# Patient Record
Sex: Female | Born: 1985
Health system: Southern US, Community
[De-identification: ages and names within clinical notes are randomized; demographics above are authoritative.]

## PROBLEM LIST (undated history)

## (undated) DIAGNOSIS — F32A Depression, unspecified: Secondary | ICD-10-CM

## (undated) DIAGNOSIS — F419 Anxiety disorder, unspecified: Secondary | ICD-10-CM

## (undated) DIAGNOSIS — N921 Excessive and frequent menstruation with irregular cycle: Secondary | ICD-10-CM

## (undated) DIAGNOSIS — F329 Major depressive disorder, single episode, unspecified: Secondary | ICD-10-CM

## (undated) DIAGNOSIS — N390 Urinary tract infection, site not specified: Secondary | ICD-10-CM

## (undated) HISTORY — DX: Excessive and frequent menstruation with irregular cycle: N92.1

## (undated) HISTORY — DX: Major depressive disorder, single episode, unspecified: F32.9

## (undated) HISTORY — DX: Urinary tract infection, site not specified: N39.0

## (undated) HISTORY — DX: Depression, unspecified: F32.A

## (undated) HISTORY — DX: Anxiety disorder, unspecified: F41.9

## (undated) HISTORY — PX: GALLBLADDER SURGERY: SHX652

---

## 2005-04-07 ENCOUNTER — Observation Stay: Payer: Self-pay

## 2005-05-21 ENCOUNTER — Observation Stay: Payer: Self-pay | Admitting: Obstetrics and Gynecology

## 2005-05-21 ENCOUNTER — Inpatient Hospital Stay: Payer: Self-pay | Admitting: Obstetrics and Gynecology

## 2006-11-26 ENCOUNTER — Ambulatory Visit: Payer: Self-pay | Admitting: Internal Medicine

## 2007-08-16 ENCOUNTER — Inpatient Hospital Stay: Payer: Self-pay

## 2009-06-12 ENCOUNTER — Ambulatory Visit: Payer: Self-pay | Admitting: Physician Assistant

## 2009-10-11 ENCOUNTER — Other Ambulatory Visit: Payer: Self-pay | Admitting: Physician Assistant

## 2009-10-12 ENCOUNTER — Ambulatory Visit: Payer: Self-pay | Admitting: General Practice

## 2011-03-26 ENCOUNTER — Other Ambulatory Visit: Payer: Self-pay | Admitting: Physician Assistant

## 2011-06-24 ENCOUNTER — Ambulatory Visit: Payer: Self-pay | Admitting: General Practice

## 2011-07-08 ENCOUNTER — Emergency Department: Payer: Self-pay | Admitting: General Practice

## 2011-08-19 ENCOUNTER — Other Ambulatory Visit: Payer: Self-pay | Admitting: Physician Assistant

## 2011-08-28 ENCOUNTER — Ambulatory Visit: Payer: Self-pay | Admitting: Family Medicine

## 2011-08-29 ENCOUNTER — Emergency Department: Payer: Self-pay | Admitting: Unknown Physician Specialty

## 2011-09-02 ENCOUNTER — Other Ambulatory Visit: Payer: Self-pay | Admitting: Family

## 2011-09-08 ENCOUNTER — Ambulatory Visit: Payer: Self-pay | Admitting: Gastroenterology

## 2011-09-19 ENCOUNTER — Other Ambulatory Visit: Payer: Self-pay | Admitting: Family

## 2011-09-29 ENCOUNTER — Ambulatory Visit: Payer: Self-pay | Admitting: Gastroenterology

## 2011-10-10 ENCOUNTER — Ambulatory Visit: Payer: Self-pay | Admitting: Gastroenterology

## 2011-10-17 LAB — PATHOLOGY REPORT

## 2011-10-24 ENCOUNTER — Ambulatory Visit: Payer: Self-pay | Admitting: General Surgery

## 2011-10-29 LAB — PATHOLOGY REPORT

## 2011-12-24 ENCOUNTER — Other Ambulatory Visit: Payer: Self-pay | Admitting: Family

## 2011-12-24 LAB — COMPREHENSIVE METABOLIC PANEL
Anion Gap: 13 (ref 7–16)
BUN: 11 mg/dL (ref 7–18)
Calcium, Total: 8.7 mg/dL (ref 8.5–10.1)
Chloride: 103 mmol/L (ref 98–107)
Co2: 26 mmol/L (ref 21–32)
Creatinine: 0.98 mg/dL (ref 0.60–1.30)
EGFR (African American): >60
EGFR (Non-African Amer.): >60
SGOT(AST): 17 U/L (ref 15–37)
SGPT (ALT): 18 U/L
Total Protein: 7.8 g/dL (ref 6.4–8.2)

## 2011-12-24 LAB — CBC WITH DIFFERENTIAL/PLATELET
Basophil #: 0 10*3/uL (ref 0.0–0.1)
Eosinophil #: 0.1 10*3/uL (ref 0.0–0.7)
HCT: 42.4 % (ref 35.0–47.0)
Lymphocyte %: 21.7 %
MCH: 30.4 pg (ref 26.0–34.0)
MCHC: 33.9 g/dL (ref 32.0–36.0)
MCV: 90 fL (ref 80–100)
Monocyte #: 0.6 10*3/uL (ref 0.0–0.7)
Neutrophil #: 7 10*3/uL — ABNORMAL HIGH (ref 1.4–6.5)
Neutrophil %: 71 %
Platelet: 267 10*3/uL (ref 150–440)
RDW: 12.4 % (ref 11.5–14.5)
WBC: 9.9 10*3/uL (ref 3.6–11.0)

## 2011-12-24 LAB — SEDIMENTATION RATE: Erythrocyte Sed Rate: 2 mm/hr (ref 0–20)

## 2011-12-31 ENCOUNTER — Ambulatory Visit: Payer: Self-pay | Admitting: Gastroenterology

## 2012-01-19 ENCOUNTER — Other Ambulatory Visit: Payer: Self-pay | Admitting: Gastroenterology

## 2012-01-19 LAB — URINALYSIS, COMPLETE
Bilirubin,UR: NEGATIVE
Leukocyte Esterase: NEGATIVE
Ph: 6 (ref 4.5–8.0)
Protein: NEGATIVE
RBC,UR: 1 /HPF (ref 0–5)
Squamous Epithelial: 2

## 2012-01-20 LAB — URINE CULTURE

## 2012-02-17 ENCOUNTER — Other Ambulatory Visit: Payer: Self-pay | Admitting: Family

## 2012-03-17 ENCOUNTER — Ambulatory Visit: Payer: Self-pay | Admitting: Gastroenterology

## 2012-03-17 LAB — CBC WITH DIFFERENTIAL/PLATELET
Basophil #: 0 10*3/uL (ref 0.0–0.1)
Basophil %: 0.3 %
HCT: 41.5 % (ref 35.0–47.0)
Lymphocyte %: 12.2 %
MCH: 30.5 pg (ref 26.0–34.0)
Monocyte #: 0.5 x10 3/mm (ref 0.2–0.9)
Monocyte %: 4.9 %
Neutrophil #: 7.5 10*3/uL — ABNORMAL HIGH (ref 1.4–6.5)
Neutrophil %: 80.5 %
RBC: 4.63 10*6/uL (ref 3.80–5.20)
RDW: 13.1 % (ref 11.5–14.5)

## 2012-03-17 LAB — URINALYSIS, COMPLETE
Blood: NEGATIVE
Glucose,UR: NEGATIVE mg/dL (ref 0–75)
Ketone: NEGATIVE
Protein: NEGATIVE
Specific Gravity: 1.014 (ref 1.003–1.030)

## 2012-03-17 LAB — AMYLASE: Amylase: 55 U/L (ref 25–115)

## 2012-03-17 LAB — COMPREHENSIVE METABOLIC PANEL
Albumin: 3.7 g/dL (ref 3.4–5.0)
Alkaline Phosphatase: 51 U/L (ref 50–136)
Anion Gap: 7 (ref 7–16)
Calcium, Total: 8.8 mg/dL (ref 8.5–10.1)
Co2: 27 mmol/L (ref 21–32)
Creatinine: 0.75 mg/dL (ref 0.60–1.30)
EGFR (African American): >60
Glucose: 80 mg/dL (ref 65–99)
Osmolality: 278 (ref 275–301)
SGOT(AST): 19 U/L (ref 15–37)
SGPT (ALT): 23 U/L
Sodium: 140 mmol/L (ref 136–145)
Total Protein: 7.3 g/dL (ref 6.4–8.2)

## 2012-03-17 LAB — LIPASE, BLOOD: Lipase: 80 U/L (ref 73–393)

## 2012-03-17 LAB — SEDIMENTATION RATE: Erythrocyte Sed Rate: 4 mm/hr (ref 0–20)

## 2012-03-31 ENCOUNTER — Ambulatory Visit: Payer: Self-pay | Admitting: Gastroenterology

## 2012-04-02 LAB — PATHOLOGY REPORT

## 2012-04-17 IMAGING — CR DG ABDOMEN 2V
1 series · 2 of 2 positions shown · non-contrast
Comparison: none

REASON FOR EXAM: abominal pain consipation and nausea
COMMENTS:

PROCEDURE:     DXR - DXR ABDOMEN 2 V FLAT AND ERECT  - September 29, 2011  [DATE]
RESULT:     Comparisons:  None

[Series 1: w abdomen upright · 0.14mm/px · 2 of 2 slices shown]
[im 1/2]
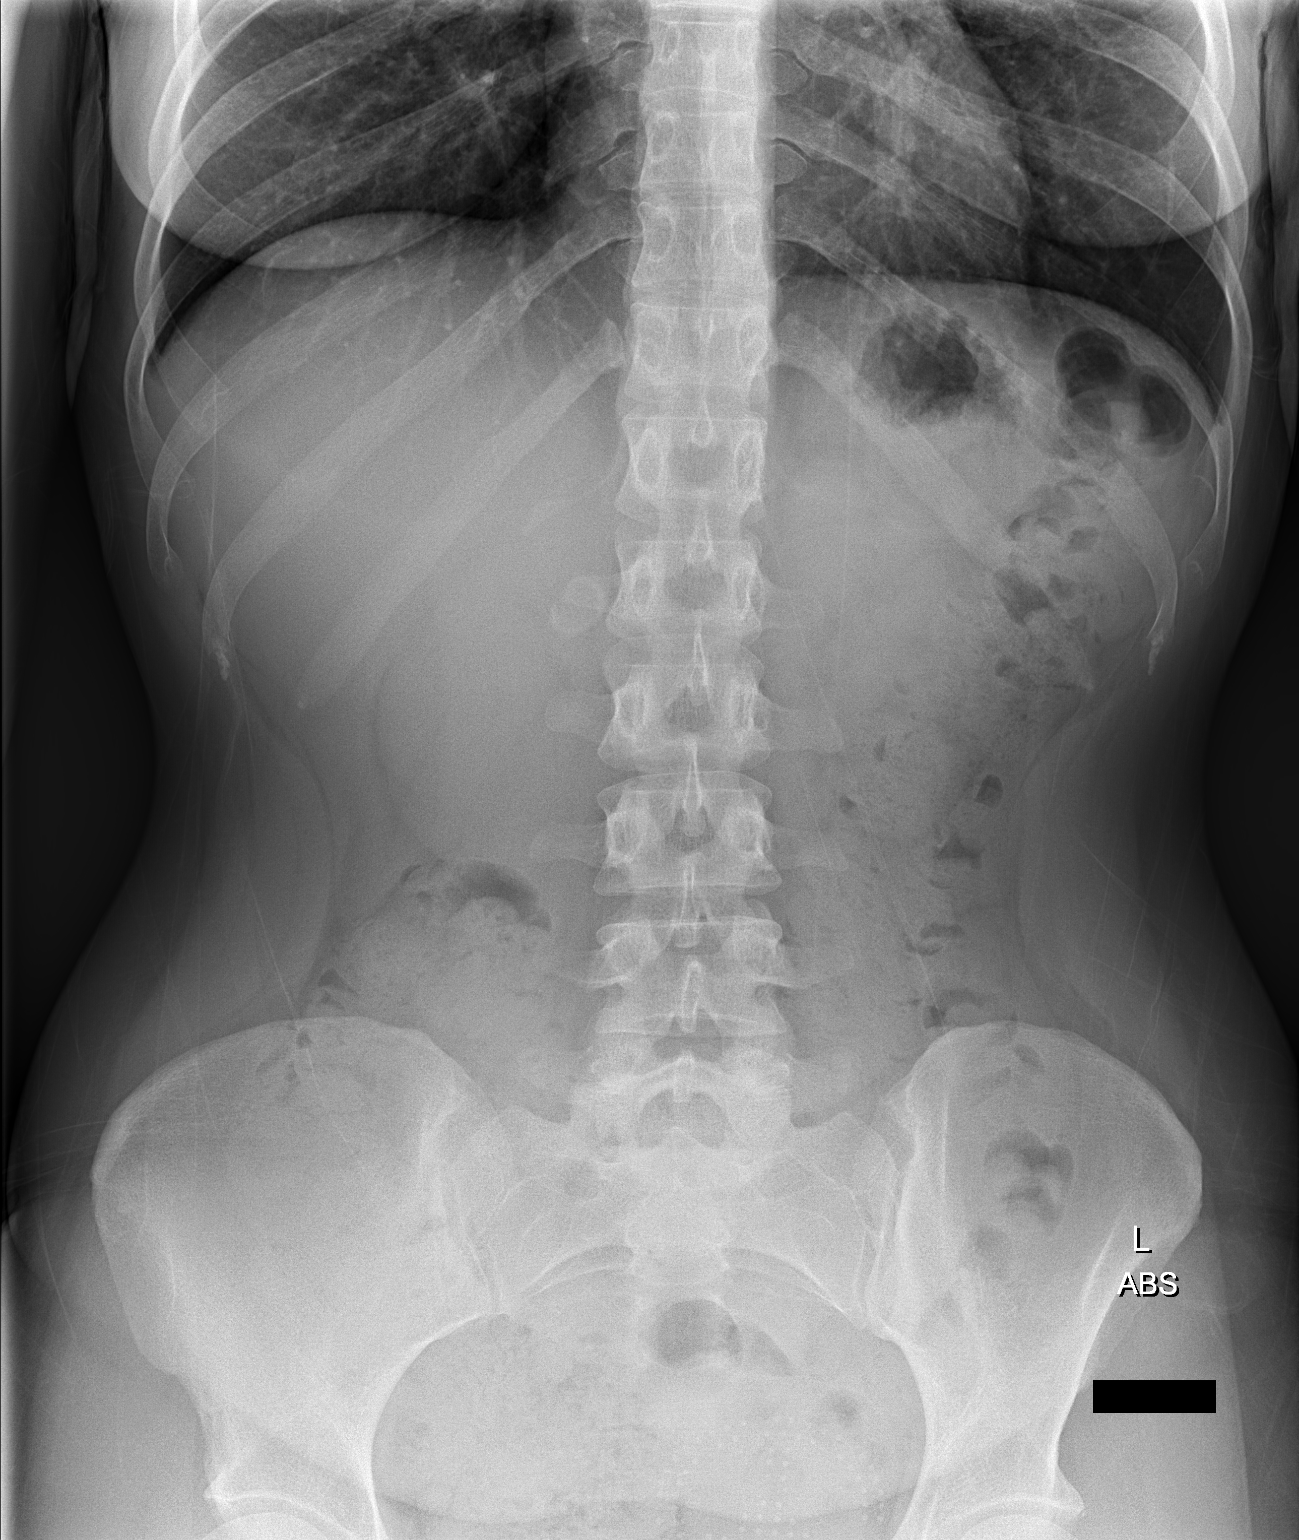
[im 2/2]
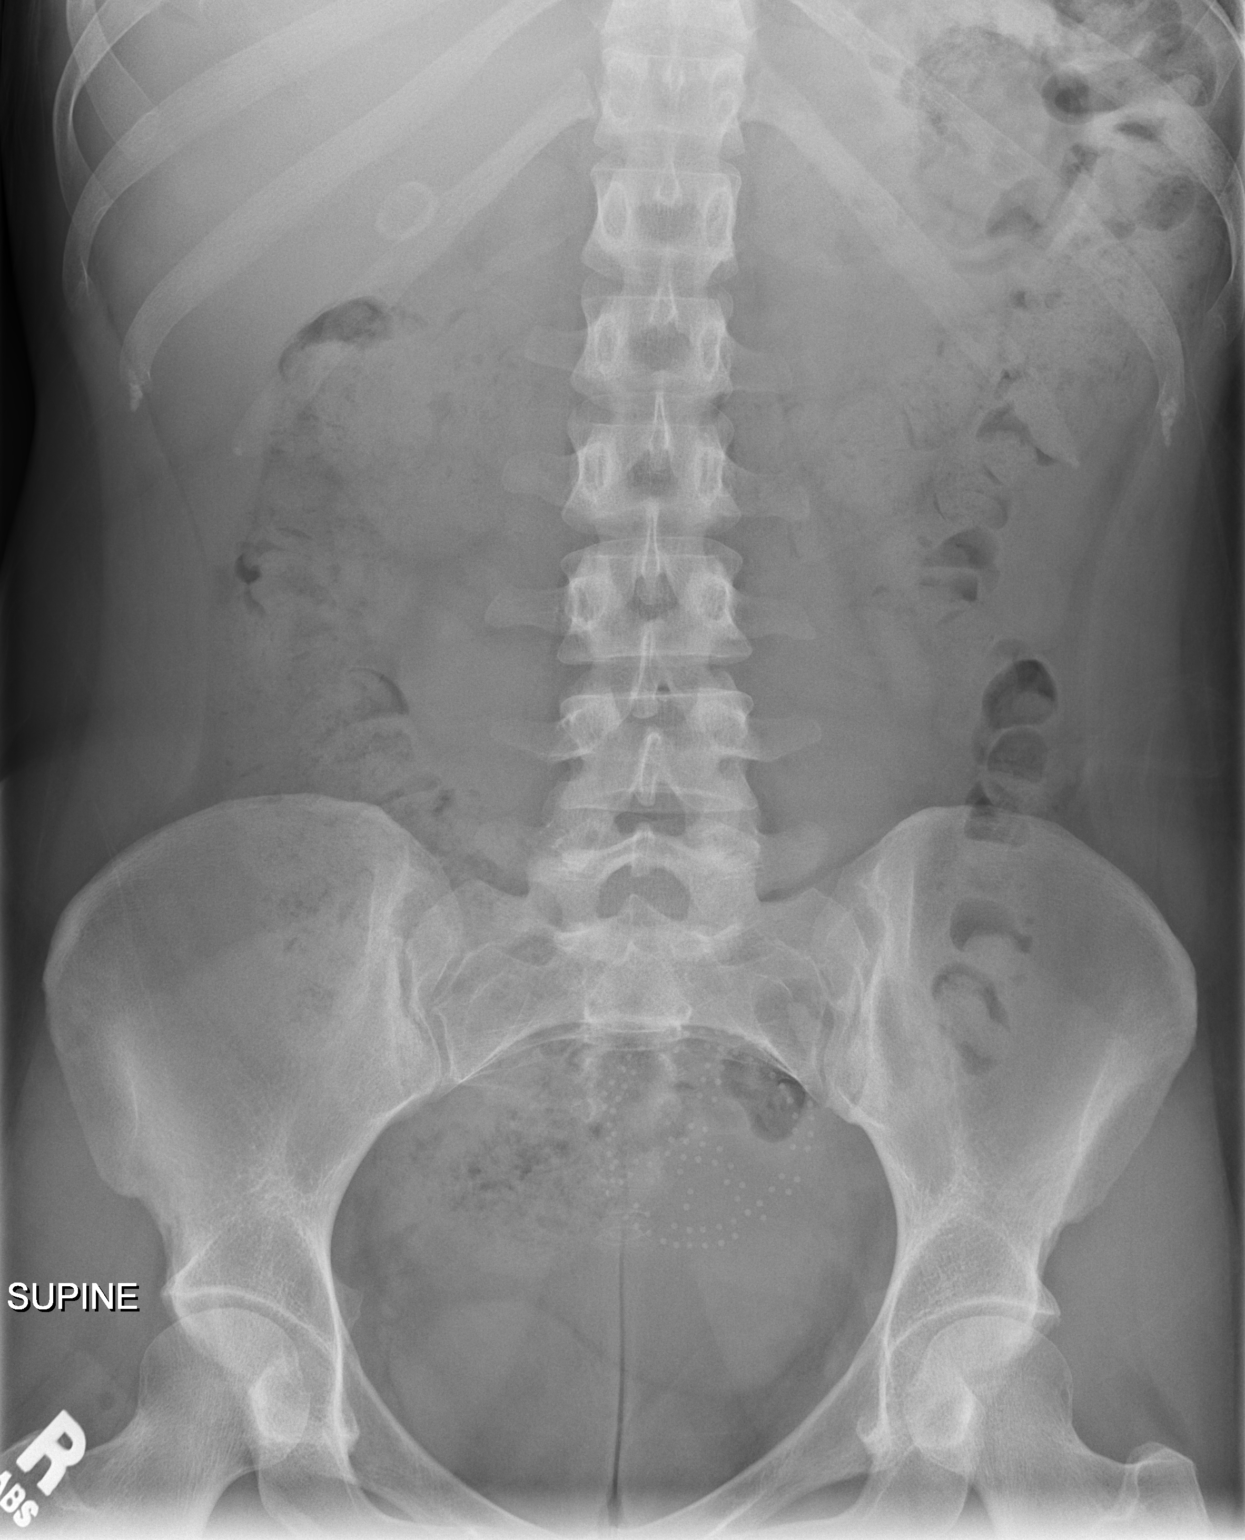

[2 of 2 positions shown; findings below may reference images not displayed]

FINDINGS: Supine and upright views of the abdomen are provided.

There is a nonspecific bowel gas pattern. There is no bowel dilatation to
suggest obstruction. There are no air-fluid levels. There is no pathologic
calcification along the expected course of the ureters. There is no evidence
of pneumoperitoneum, portal venous gas, or pneumatosis.

The osseous structures are unremarkable.
IMPRESSION: Unremarkable abdominal radiograph.

## 2012-07-18 ENCOUNTER — Ambulatory Visit: Payer: Self-pay

## 2012-09-07 ENCOUNTER — Other Ambulatory Visit: Payer: Self-pay | Admitting: Physician Assistant

## 2012-09-07 LAB — CBC WITH DIFFERENTIAL/PLATELET
Basophil #: 0 10*3/uL (ref 0.0–0.1)
Eosinophil #: 0.1 10*3/uL (ref 0.0–0.7)
HCT: 41.3 % (ref 35.0–47.0)
Lymphocyte #: 1.8 10*3/uL (ref 1.0–3.6)
Lymphocyte %: 21.7 %
MCH: 30.7 pg (ref 26.0–34.0)
MCHC: 34.5 g/dL (ref 32.0–36.0)
Monocyte %: 8.2 %
Neutrophil #: 5.7 10*3/uL (ref 1.4–6.5)
Platelet: 262 10*3/uL (ref 150–440)
RDW: 13.1 % (ref 11.5–14.5)
WBC: 8.3 10*3/uL (ref 3.6–11.0)

## 2012-09-07 LAB — FOLATE: Folic Acid: 11.6 ng/mL (ref 3.1–100.0)

## 2012-09-29 ENCOUNTER — Other Ambulatory Visit: Payer: Self-pay | Admitting: Internal Medicine

## 2012-09-29 LAB — CBC WITH DIFFERENTIAL/PLATELET
Eosinophil %: 2 %
HCT: 41.3 % (ref 35.0–47.0)
HGB: 14.3 g/dL (ref 12.0–16.0)
Lymphocyte #: 1.5 10*3/uL (ref 1.0–3.6)
MCH: 30.4 pg (ref 26.0–34.0)
MCV: 88 fL (ref 80–100)
Monocyte %: 13 %
Neutrophil #: 3.6 10*3/uL (ref 1.4–6.5)
Platelet: 250 10*3/uL (ref 150–440)
RDW: 12.5 % (ref 11.5–14.5)
WBC: 6 10*3/uL (ref 3.6–11.0)

## 2012-09-29 LAB — URINALYSIS, COMPLETE
Bacteria: NONE SEEN
Glucose,UR: NEGATIVE mg/dL (ref 0–75)
Ketone: NEGATIVE
Leukocyte Esterase: NEGATIVE
Nitrite: NEGATIVE
Protein: NEGATIVE
WBC UR: 2 /HPF (ref 0–5)

## 2012-09-29 LAB — COMPREHENSIVE METABOLIC PANEL
Albumin: 3.7 g/dL (ref 3.4–5.0)
Alkaline Phosphatase: 69 U/L (ref 50–136)
Anion Gap: 6 — ABNORMAL LOW (ref 7–16)
BUN: 11 mg/dL (ref 7–18)
Bilirubin,Total: 0.7 mg/dL (ref 0.2–1.0)
Chloride: 109 mmol/L — ABNORMAL HIGH (ref 98–107)
Co2: 25 mmol/L (ref 21–32)
Creatinine: 0.82 mg/dL (ref 0.60–1.30)
EGFR (African American): >60
EGFR (Non-African Amer.): >60
Glucose: 76 mg/dL (ref 65–99)
Osmolality: 278 (ref 275–301)
Potassium: 3.7 mmol/L (ref 3.5–5.1)
Sodium: 140 mmol/L (ref 136–145)
Total Protein: 7.2 g/dL (ref 6.4–8.2)

## 2012-09-29 LAB — TSH: Thyroid Stimulating Horm: 1.21 u[IU]/mL

## 2012-10-13 ENCOUNTER — Other Ambulatory Visit: Payer: Self-pay | Admitting: Physician Assistant

## 2012-10-13 LAB — FOLATE: Folic Acid: 8.8 ng/mL (ref 3.1–100.0)

## 2012-10-20 ENCOUNTER — Other Ambulatory Visit: Payer: Self-pay | Admitting: Gastroenterology

## 2012-10-22 ENCOUNTER — Ambulatory Visit: Payer: Self-pay | Admitting: Gastroenterology

## 2013-01-11 ENCOUNTER — Emergency Department: Payer: Self-pay | Admitting: General Practice

## 2013-02-07 ENCOUNTER — Ambulatory Visit: Payer: Self-pay | Admitting: General Practice

## 2013-02-07 LAB — HCG, QUANTITATIVE, PREGNANCY: Beta Hcg, Quant.: <1 m[IU]/mL — ABNORMAL LOW

## 2013-05-11 IMAGING — CT CT ABD-PELV W/ CM
1 of 2 series · 15 of 32 positions shown, 19 images · IV contrast (isovue)
Comparison: 08/28/2011

REASON FOR EXAM: abd pain general nausea vomiting hx ulcerative colitis
COMMENTS:

PROCEDURE:     KCT - KCT ABDOMEN/PELVIS W  - October 22, 2012  [DATE]
RESULT:     History: Ulcerative colitis, general abdominal pain
TECHNIQUE: Multiple axial images of the abdomen and pelvis were performed
from the lung bases to the pubic symphysis, with p.o. contrast and with 85
ml of Isovue 300 intravenous contrast.

[Series 2: abd 3mm w 3.0 i40f 3 · axial · 0.78mm/px · z∈[-1060,-646]mm · 15 of 150 slices shown, 19 images]
[im 6/150  soft-tissue]
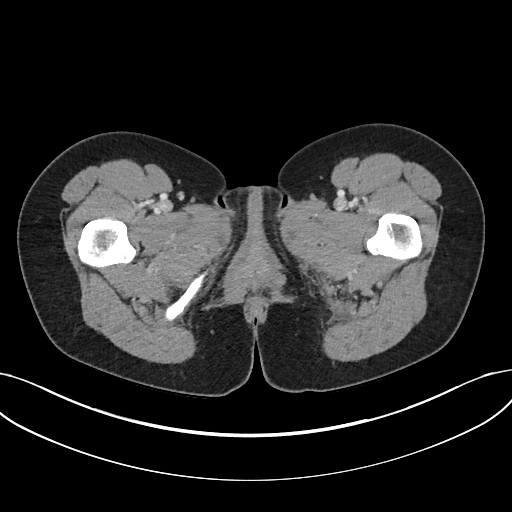
[im 6/150  bone]
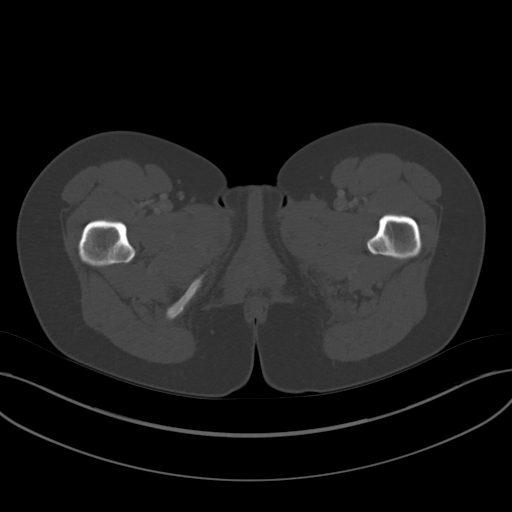
[im 18/150  soft-tissue]
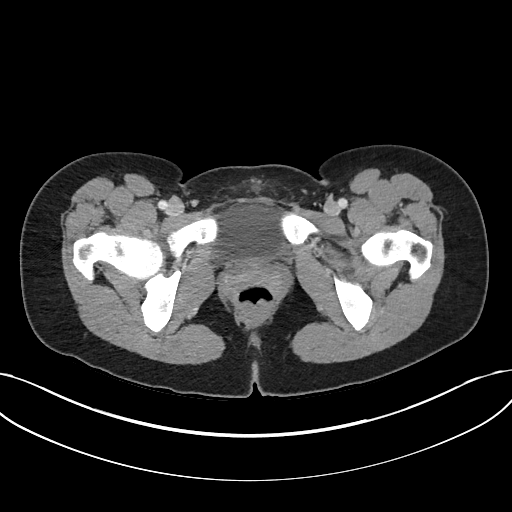
[im 29/150  soft-tissue]
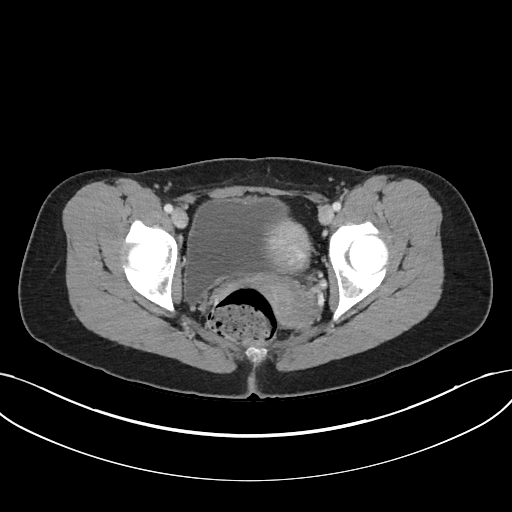
[im 41/150  soft-tissue]
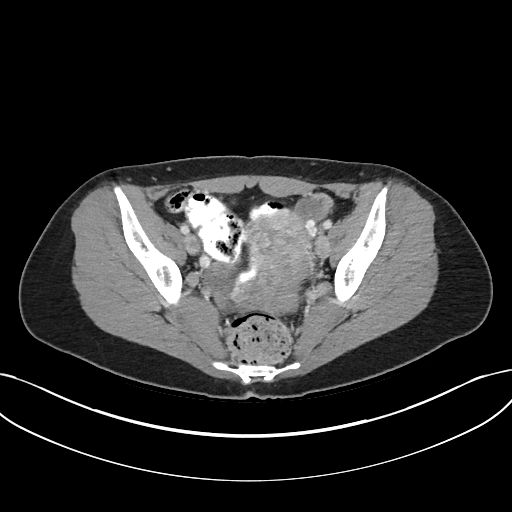
[im 52/150  soft-tissue]
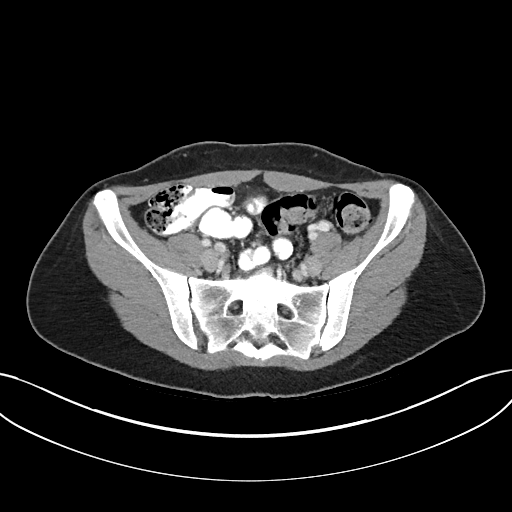
[im 64/150  soft-tissue]
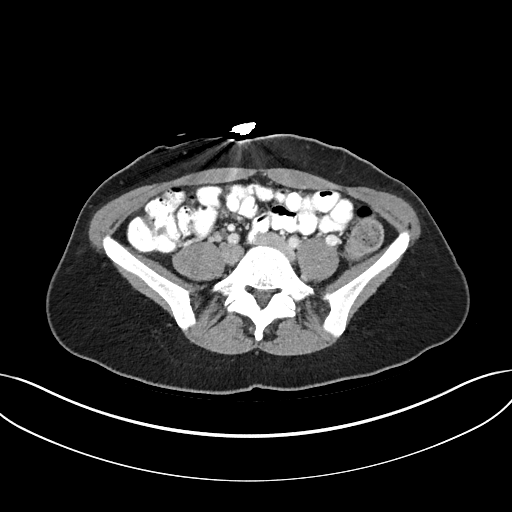
[im 75/150  soft-tissue]
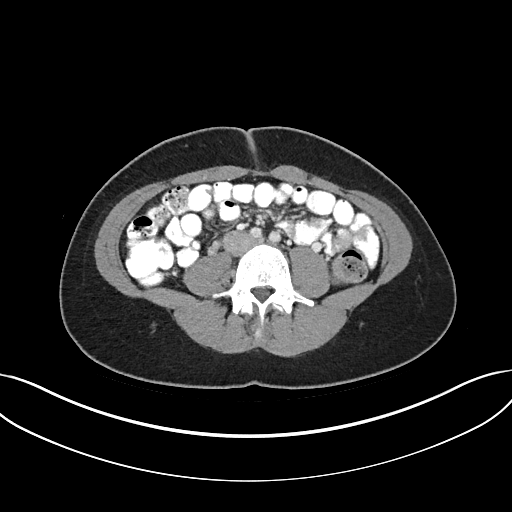
[im 86/150  soft-tissue]
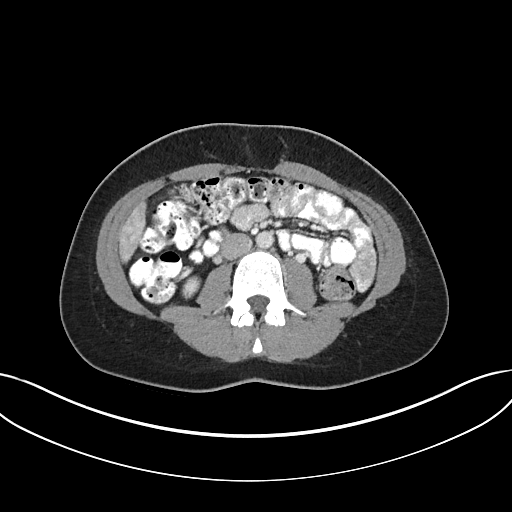
[im 98/150  soft-tissue]
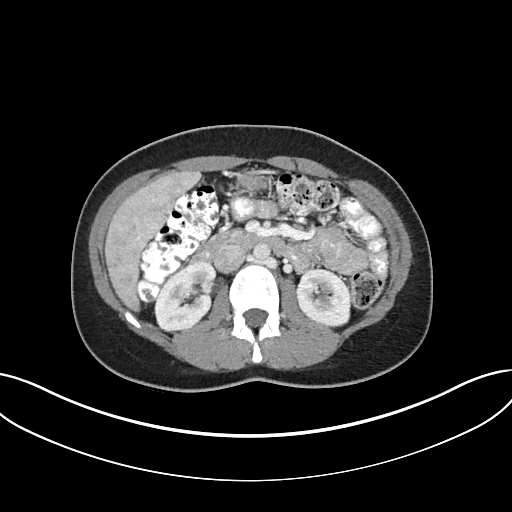
[im 98/150  bone]
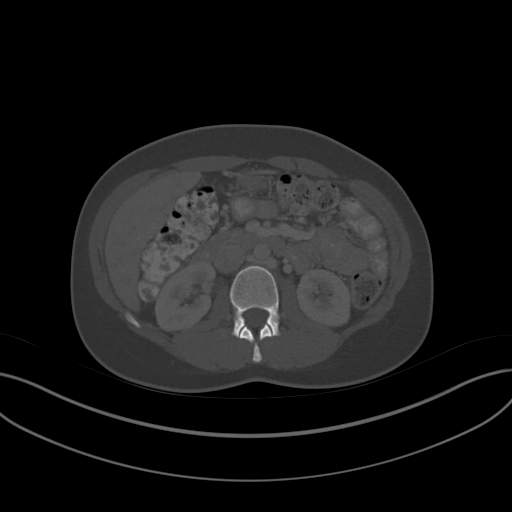
[im 109/150  soft-tissue]
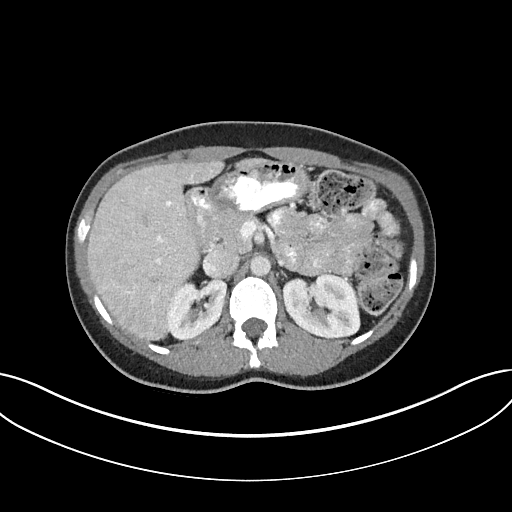
[im 121/150  soft-tissue]
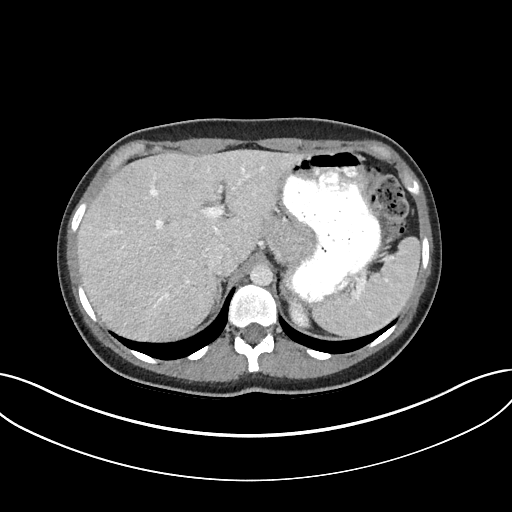
[im 127/150  lung]
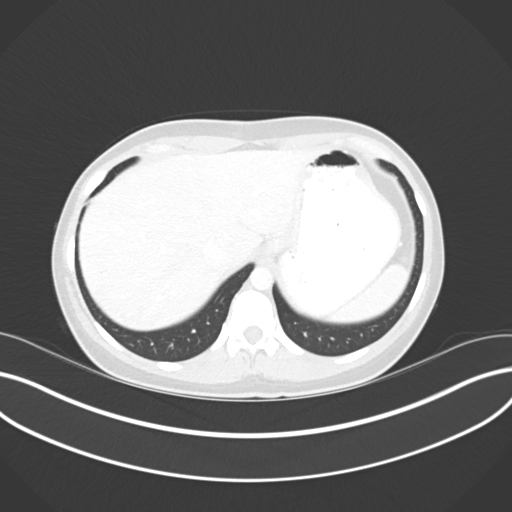
[im 132/150  soft-tissue]
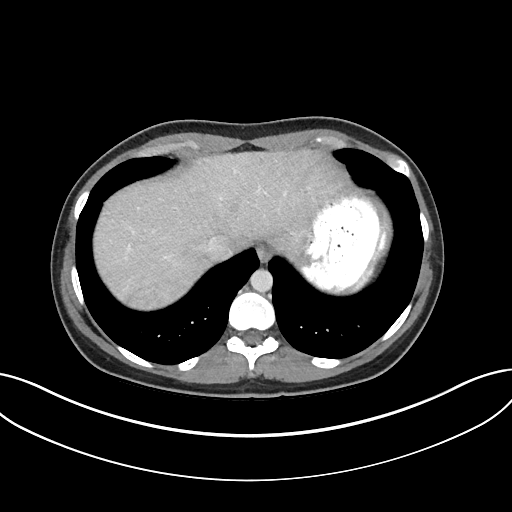
[im 132/150  lung]
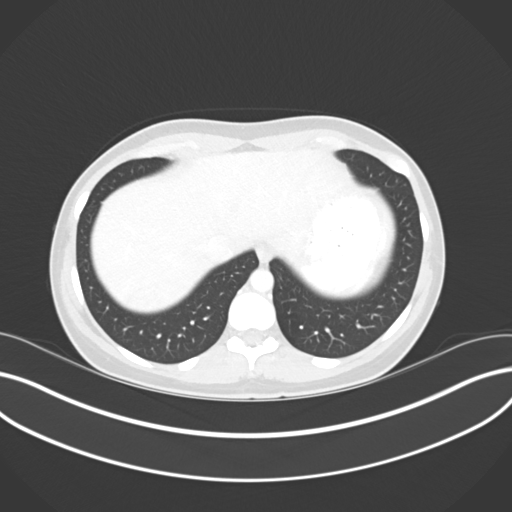
[im 138/150  lung]
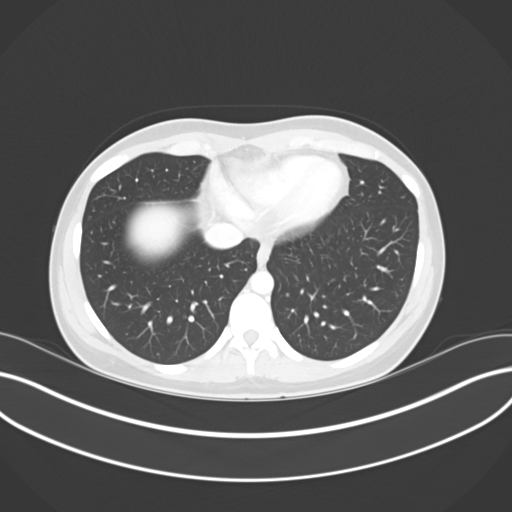
[im 144/150  soft-tissue]
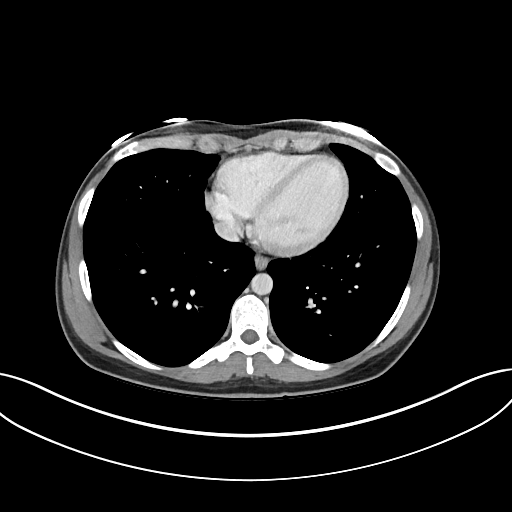
[im 144/150  lung]
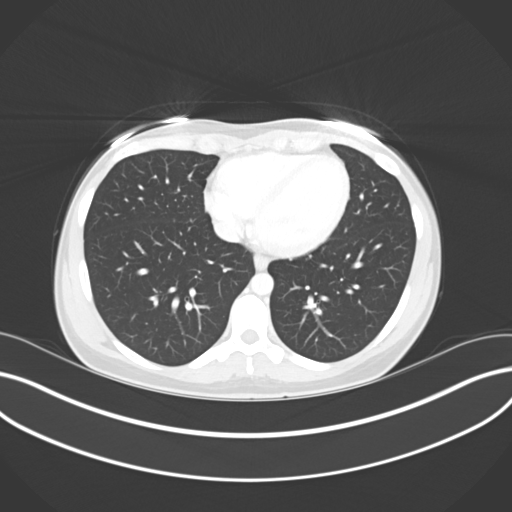

[15 of 32 positions shown; findings below may reference images not displayed]

FINDINGS: The lung bases are clear. There is no pneumothorax. The heart size is
normal.

The liver demonstrates no focal abnormality. There is no intrahepatic or
extrahepatic biliary ductal dilatation. The gallbladder is surgically
absent. The spleen demonstrates no focal abnormality. The kidneys, adrenal
glands, and pancreas are normal. The bladder is unremarkable.

The stomach, duodenum, small intestine, and large intestine demonstrate no
contrast extravasation or dilatation. There is a normal caliber appendix in
the right lower quadrant without periappendiceal inflammatory changes. There
is no pneumoperitoneum, pneumatosis, or portal venous gas. There is no
abdominal or pelvic free fluid. There is no lymphadenopathy.

The abdominal aorta is normal in caliber .

The osseous structures are unremarkable.
IMPRESSION: 1. No acute abdominal or pelvic pathology.

[REDACTED]

## 2013-09-25 ENCOUNTER — Ambulatory Visit: Payer: Self-pay | Admitting: Family Medicine

## 2013-09-25 LAB — CBC WITH DIFFERENTIAL/PLATELET
Basophil %: 0.3 %
Eosinophil %: 1.4 %
Lymphocyte %: 27.2 %
MCH: 30.5 pg (ref 26.0–34.0)
MCV: 89 fL (ref 80–100)
Monocyte #: 0.4 x10 3/mm (ref 0.2–0.9)
Neutrophil %: 64.3 %
Platelet: 237 10*3/uL (ref 150–440)
RDW: 12.7 % (ref 11.5–14.5)
WBC: 6.5 10*3/uL (ref 3.6–11.0)

## 2013-09-25 LAB — MONONUCLEOSIS SCREEN: Mono Test: NEGATIVE

## 2013-09-28 LAB — BETA STREP CULTURE(ARMC)

## 2014-07-12 LAB — HM PAP SMEAR: HM Pap smear: NEGATIVE

## 2015-04-30 ENCOUNTER — Telehealth: Payer: Self-pay | Admitting: Obstetrics and Gynecology

## 2015-04-30 NOTE — Telephone Encounter (Signed)
Pt is on Camrese and she has been cramping really bad and bleeding. Please call back to discuss.

## 2015-05-01 ENCOUNTER — Other Ambulatory Visit: Payer: Self-pay | Admitting: Obstetrics and Gynecology

## 2015-05-01 NOTE — Telephone Encounter (Signed)
Please have her stop the pill x 4 days, then restart taking one a day as previously prescribed, that should reset things, also can do 2 aleeve every 8 hours or 3 motrin every 6 hors for the cramping. Please have her let us know if the bleeding doesn't stop within a few days of restarting the pills.

## 2015-05-01 NOTE — Telephone Encounter (Signed)
Notified pt she voiced understanding 

## 2015-05-01 NOTE — Telephone Encounter (Signed)
Pt states she has been bleeding x 3 weeks, states the cramping has gotten really bad, states the bleeding isnt very heavy just enough to be a bother, pls advise

## 2015-07-16 ENCOUNTER — Encounter: Payer: Self-pay | Admitting: *Deleted

## 2015-07-17 ENCOUNTER — Encounter: Payer: Self-pay | Admitting: Obstetrics and Gynecology

## 2015-08-15 ENCOUNTER — Encounter: Payer: Self-pay | Admitting: Obstetrics and Gynecology

## 2015-08-24 DIAGNOSIS — R519 Headache, unspecified: Secondary | ICD-10-CM | POA: Insufficient documentation

## 2015-10-26 ENCOUNTER — Encounter: Payer: Self-pay | Admitting: Obstetrics and Gynecology

## 2015-11-15 ENCOUNTER — Other Ambulatory Visit: Payer: Self-pay | Admitting: Obstetrics and Gynecology

## 2015-11-15 ENCOUNTER — Encounter: Payer: Self-pay | Admitting: Obstetrics and Gynecology

## 2015-11-15 ENCOUNTER — Ambulatory Visit (INDEPENDENT_AMBULATORY_CARE_PROVIDER_SITE_OTHER): Payer: BLUE CROSS/BLUE SHIELD | Admitting: Obstetrics and Gynecology

## 2015-11-15 VITALS — BP 122/84 | HR 80 | Ht 62.0 in | Wt 139.5 lb

## 2015-11-15 DIAGNOSIS — K5900 Constipation, unspecified: Secondary | ICD-10-CM

## 2015-11-15 DIAGNOSIS — Z01419 Encounter for gynecological examination (general) (routine) without abnormal findings: Secondary | ICD-10-CM | POA: Diagnosis not present

## 2015-11-15 DIAGNOSIS — K5909 Other constipation: Secondary | ICD-10-CM | POA: Insufficient documentation

## 2015-11-15 MED ORDER — LINACLOTIDE 145 MCG PO CAPS
145.0000 ug | ORAL_CAPSULE | Freq: Every day | ORAL | Status: DC
Start: 2015-11-15 — End: 2016-12-04

## 2015-11-15 MED ORDER — LEVONORGEST-ETH ESTRAD 91-DAY 0.15-0.03 &0.01 MG PO TABS
1.0000 | ORAL_TABLET | Freq: Every day | ORAL | Status: DC
Start: 2015-11-15 — End: 2017-02-17

## 2015-11-15 NOTE — Progress Notes (Signed)
Subjective:   Tammy Deleon is a 30 y.o. G71P0 Caucasian female here for a routine well-woman exam.  Patient's last menstrual period was 10/25/2015 (exact date).    Current complaints: constipation-chronic- not relieved with miralx, and ducolax gives her terrible abdominal pains. PCP: me       Doesn't need labs  Social History: Sexual: heterosexual Marital Status: married Living situation: with family Occupation: unknown occupation Tobacco/alcohol: no tobacco use Illicit drugs: no history of illicit drug use  The following portions of the patient's history were reviewed and updated as appropriate: allergies, current medications, past family history, past medical history, past social history, past surgical history and problem list.  Past Medical History Past Medical History  Diagnosis Date  . UTI (lower urinary tract infection)   . Irregular intermenstrual bleeding     Past Surgical History Past Surgical History  Procedure Laterality Date  . Gallbladder surgery      Gynecologic History G2P0  Patient's last menstrual period was 10/25/2015 (exact date). Contraception: OCP (estrogen/progesterone) Last Pap: 2014. Results were: normal   Obstetric History OB History  Gravida Para Term Preterm AB SAB TAB Ectopic Multiple Living  2         2    # Outcome Date GA Lbr Len/2nd Weight Sex Delivery Anes PTL Lv  2 Gravida 2008    F    Y  1 Gravida 2006    M    Y      Current Medications Current Outpatient Prescriptions on File Prior to Visit  Medication Sig Dispense Refill  . Levonorgestrel-Ethinyl Estradiol (SEASONIQUE) 0.15-0.03 &0.01 MG tablet Take 1 tablet by mouth daily.     No current facility-administered medications on file prior to visit.    Review of Systems Patient denies any headaches, blurred vision, shortness of breath, chest pain, abdominal pain, problems with bowel movements, urination, or intercourse.  Objective:  BP 122/84 mmHg  Pulse 80  Ht 5' 2"   (1.575 m)  Wt 139 lb 8 oz (63.277 kg)  BMI 25.51 kg/m2  LMP 10/25/2015 (Exact Date) Physical Exam  General:  Well developed, well nourished, no acute distress. She is alert and oriented x3. Skin:  Warm and dry Neck:  Midline trachea, no thyromegaly or nodules Cardiovascular: Regular rate and rhythm, no murmur heard Lungs:  Effort normal, all lung fields clear to auscultation bilaterally Breasts:  No dominant palpable mass, retraction, or nipple discharge Abdomen:  Soft, non tender, no hepatosplenomegaly or masses Pelvic:  External genitalia is normal in appearance.  The vagina is normal in appearance. The cervix is bulbous, no CMT.  Thin prep pap is done with HR HPV cotesting. Uterus is felt to be normal size, shape, and contour.  No adnexal masses or tenderness noted. Extremities:  No swelling or varicosities noted Psych:  She has a normal mood and affect  Assessment:   Healthy well-woman exam Chronic constipation  Plan:  Pap obtained F/U 1 year for AE, or sooner if needed Samples of Linzess 123m given for trial  Melody NRockney Ghee CNM

## 2015-11-15 NOTE — Patient Instructions (Signed)
  Place annual gynecologic exam patient instructions here.  Thank you for enrolling in [facility]. Please follow the instructions below to securely access your online medical record. MyChart allows you to send messages to your doctor, view your test results, manage appointments, and more.   How Do I Sign Up? 1. In your Internet browser, go to [employer] and enter [email or web address]. 2. Click on the Sign Up Now link in the Sign In box. You will see the New Member Sign Up page. 3. Enter your MyChart Access Code exactly as it appears below. You will not need to use this code after you've completed the sign-up process. If you do not sign up before the expiration date, you must request a new code.  MyChart Access Code: [email or web address] Expires: 11/16/2015  2:25 PM  4. Enter your Social Security Number ([id number]) and Date of Birth (mm/dd/yyyy) as indicated and click Submit. You will be taken to the next sign-up page. 5. Create a MyChart ID. This will be your MyChart login ID and cannot be changed, so think of one that is secure and easy to remember. 6. Create a MyChart password. You can change your password at any time. 7. Enter your Password Reset Question and Answer. This can be used at a later time if you forget your password.  8. Enter your e-mail address. You will receive e-mail notification when new information is available in [facility]. 9. Click Sign Up. You can now view your medical record.   Additional Information Remember, MyChart is NOT to be used for urgent needs. For medical emergencies, dial 911.

## 2015-11-19 LAB — CYTOLOGY - PAP

## 2016-01-10 ENCOUNTER — Telehealth: Payer: Self-pay | Admitting: Obstetrics and Gynecology

## 2016-01-10 NOTE — Telephone Encounter (Signed)
Patient called stating she thinks she has a uti. She is having burning with urination. She is in for a nurse visit tomorrow.

## 2016-01-10 NOTE — Telephone Encounter (Signed)
Noted  

## 2016-01-11 ENCOUNTER — Ambulatory Visit (INDEPENDENT_AMBULATORY_CARE_PROVIDER_SITE_OTHER): Payer: BLUE CROSS/BLUE SHIELD | Admitting: Obstetrics and Gynecology

## 2016-01-11 VITALS — BP 107/77 | HR 99 | Temp 98.7°F | Ht 62.0 in | Wt 137.2 lb

## 2016-01-11 DIAGNOSIS — R300 Dysuria: Secondary | ICD-10-CM | POA: Diagnosis not present

## 2016-01-11 LAB — POCT URINALYSIS DIPSTICK
Bilirubin, UA: NEGATIVE
Glucose, UA: NEGATIVE
KETONES UA: NEGATIVE
NITRITE UA: NEGATIVE
Protein, UA: NEGATIVE
SPEC GRAV UA: 1.015
Urobilinogen, UA: 0.2
pH, UA: 7.5

## 2016-01-11 NOTE — Progress Notes (Signed)
Patient ID: Tammy Deleon, female   DOB: 03/23/1986, 30 y.o.   MRN: [id number]   Pt presents for possible uti. Has had pain and pressure with urination x 3d. NO fevers or flank pain. U/a shows - trace leuks and mod non hem blood only. No nitrites. Will send culture. Advised pt to push h20 and cranberry juice. Try azo. Will contact when culture is back Pt to contact office if she has fever or severe abd pain.

## 2016-01-12 LAB — URINE CULTURE: Organism ID, Bacteria: NO GROWTH

## 2016-01-16 ENCOUNTER — Telehealth: Payer: Self-pay | Admitting: Obstetrics and Gynecology

## 2016-01-16 NOTE — Telephone Encounter (Signed)
Patient called requesting culture results.Thanks

## 2016-01-21 ENCOUNTER — Encounter: Payer: Self-pay | Admitting: *Deleted

## 2016-01-21 NOTE — Telephone Encounter (Signed)
Sent mychart message

## 2016-03-19 DIAGNOSIS — F411 Generalized anxiety disorder: Secondary | ICD-10-CM | POA: Insufficient documentation

## 2016-11-18 ENCOUNTER — Ambulatory Visit (INDEPENDENT_AMBULATORY_CARE_PROVIDER_SITE_OTHER): Payer: BLUE CROSS/BLUE SHIELD | Admitting: Obstetrics and Gynecology

## 2016-11-18 ENCOUNTER — Encounter: Payer: Self-pay | Admitting: Obstetrics and Gynecology

## 2016-11-18 VITALS — BP 120/90 | HR 98 | Ht 62.0 in | Wt 157.3 lb

## 2016-11-18 DIAGNOSIS — Z01419 Encounter for gynecological examination (general) (routine) without abnormal findings: Secondary | ICD-10-CM

## 2016-11-18 NOTE — Progress Notes (Signed)
Subjective:   Tammy Deleon is a 31 y.o. G2P0 Caucasian female here for a routine well-woman exam.  Patient's last menstrual period was 11/06/2016.    Current complaints: none PCP: Klien      doesn't desire labs  Social History: Sexual: heterosexual Marital Status: married Living situation: with family Occupation: research associate Tobacco/alcohol: no alcohol use Illicit drugs: no history of illicit drug use  The following portions of the patient's history were reviewed and updated as appropriate: allergies, current medications, past family history, past medical history, past social history, past surgical history and problem list.  Past Medical History Past Medical History:  Diagnosis Date  . Anxiety   . Depression   . Irregular intermenstrual bleeding   . UTI (lower urinary tract infection)     Past Surgical History Past Surgical History:  Procedure Laterality Date  . GALLBLADDER SURGERY      Gynecologic History G2P0  Patient's last menstrual period was 11/06/2016. Contraception: OCP (estrogen/progesterone) Last Pap: 2016. Results were: normal   Obstetric History OB History  Gravida Para Term Preterm AB Living  2         2  SAB TAB Ectopic Multiple Live Births          2    # Outcome Date GA Lbr Len/2nd Weight Sex Delivery Anes PTL Lv  2 Gravida 2008    F    LIV  1 Gravida 2006    M    LIV      Current Medications Current Outpatient Prescriptions on File Prior to Visit  Medication Sig Dispense Refill  . Levonorgestrel-Ethinyl Estradiol (SEASONIQUE) 0.15-0.03 &0.01 MG tablet Take 1 tablet by mouth daily. 1 Package 4  . Linaclotide (LINZESS) 145 MCG CAPS capsule Take 1 capsule (145 mcg total) by mouth daily. (Patient not taking: Reported on 11/18/2016) 90 capsule 3   No current facility-administered medications on file prior to visit.     Review of Systems Patient denies any headaches, blurred vision, shortness of breath, chest pain, abdominal pain,  problems with bowel movements, urination, or intercourse.  Objective:  BP 120/90   Pulse 98   Ht 5' 2"  (1.575 m)   Wt 157 lb 4.8 oz (71.4 kg)   LMP 11/06/2016   BMI 28.77 kg/m  Physical Exam  General:  Well developed, well nourished, no acute distress. She is alert and oriented x3. Skin:  Warm and dry Neck:  Midline trachea, no thyromegaly or nodules Cardiovascular: Regular rate and rhythm, no murmur heard Lungs:  Effort normal, all lung fields clear to auscultation bilaterally Breasts:  No dominant palpable mass, retraction, or nipple discharge Abdomen:  Soft, non tender, no hepatosplenomegaly or masses Pelvic:  External genitalia is normal in appearance.  The vagina is normal in appearance. The cervix is bulbous, no CMT.  Thin prep pap not done. Uterus is felt to be normal size, shape, and contour.  No adnexal masses or tenderness noted. Extremities:  No swelling or varicosities noted Psych:  She has a normal mood and affect  Assessment:   Healthy well-woman exam OCP user  Plan:  Happy with BC-will continue F/U 1 year for AE, or sooner if needed   Melody Rockney Ghee, CNM

## 2016-11-21 ENCOUNTER — Ambulatory Visit (INDEPENDENT_AMBULATORY_CARE_PROVIDER_SITE_OTHER): Payer: BLUE CROSS/BLUE SHIELD | Admitting: Obstetrics and Gynecology

## 2016-11-21 ENCOUNTER — Encounter: Payer: Self-pay | Admitting: Obstetrics and Gynecology

## 2016-11-21 ENCOUNTER — Encounter: Payer: BLUE CROSS/BLUE SHIELD | Admitting: Obstetrics and Gynecology

## 2016-11-21 VITALS — BP 124/78 | HR 88 | Ht 62.0 in | Wt 158.4 lb

## 2016-11-21 DIAGNOSIS — E663 Overweight: Secondary | ICD-10-CM

## 2016-11-21 DIAGNOSIS — Z6828 Body mass index (BMI) 28.0-28.9, adult: Secondary | ICD-10-CM

## 2016-11-21 MED ORDER — CYANOCOBALAMIN 1000 MCG/ML IJ SOLN: 1000.0000 ug | INTRAMUSCULAR | 1 refills | Status: DC

## 2016-11-21 MED ORDER — PHENTERMINE HCL 37.5 MG PO TABS: 37.5000 mg | ORAL_TABLET | Freq: Every day | ORAL | 2 refills | Status: DC

## 2016-11-21 NOTE — Patient Instructions (Signed)
Consider the Low Glycemic Index Diet and 6 smaller meals daily .  This boosts your metabolism and regulates your sugars:   Use the protein bar by Atkins because they have lots of fiber in them  Find the low carb flatbreads, tortillas and pita breads for sandwiches:  Joseph's makes a pita bread and a flat bread , available at [facility] and [employer]; [place] makes a low carb flatbread available at [employer] and HT that is 9 net carbs and 100 cal [place] makes a low carb whole wheat tortilla available at [employer] most grocery stores with 6 net carbs and 210 cal  [place] yogurt can still have a lot of carbs .  Dannon Light N fit has 80 cal and 8 carbs

## 2016-11-21 NOTE — Progress Notes (Signed)
Subjective:  Tammy Deleon is a 31 y.o. G2P0 at Unknown being seen today for weight loss management- initial visit.  Patient reports General ROS: negative and reports previous weight loss attempts:   The following portions of the patient's history were reviewed and updated as appropriate: allergies, current medications, past family history, past medical history, past social history, past surgical history and problem list.   Objective:   Vitals:   11/21/16 1202  BP: 124/78  Pulse: 88  Weight: 158 lb 6.4 oz (71.8 kg)  Height: 5' 2"  (1.575 m)    General:  Alert, oriented and cooperative. Patient is in no acute distress.  :   :   :   :   :   :   PE: Well groomed female in no current distress,   Mental Status: Normal mood and affect. Normal behavior. Normal judgment and thought content.   Current BMI: Body mass index is 28.97 kg/m.   Assessment and Plan:  Obesity  There are no diagnoses linked to this encounter.  Plan: low carb, High protein diet RX for adipex 37.5 mg daily and B12 1060mg.ml monthly, to start now with first injection given at today's visit. Reviewed side-effects common to both medications and expected outcomes. Increase daily water intake to at least 8 bottle a day, every day.  Goal is to reduse weight by 10% by end of three months, and will re-evaluate then.  RTC in 4 weeks for Nurse visit to check weight & BP, and get next B12 injections.    Please refer to After Visit Summary for other counseling recommendations.    Sayid Moll N SKeizer CNM   Caylan Chenard NGolden West Financial CNM      Consider the Low Glycemic Index Diet and 6 smaller meals daily .  This boosts your metabolism and regulates your sugars:   Use the protein bar by Atkins because they have lots of fiber in them  Find the low carb flatbreads, tortillas and pita breads for sandwiches:  Joseph's makes a pita bread and a flat bread , available at [facility] [employer]; [place] a low  carb flatbread available at [employer] that is 9 net carbs and 100 cal [place] makes a low carb whole wheat tortilla available at [employer] grocery stores with 6 net carbs and 210 cal  [place]urt can still have a lot of carbs .  Dannon Light N fit has 80 cal and 8 carbs

## 2016-12-04 ENCOUNTER — Other Ambulatory Visit: Payer: Self-pay | Admitting: Obstetrics and Gynecology

## 2016-12-19 ENCOUNTER — Encounter: Payer: Self-pay | Admitting: Obstetrics and Gynecology

## 2016-12-19 ENCOUNTER — Ambulatory Visit (INDEPENDENT_AMBULATORY_CARE_PROVIDER_SITE_OTHER): Payer: BLUE CROSS/BLUE SHIELD | Admitting: Obstetrics and Gynecology

## 2016-12-19 VITALS — BP 132/84 | HR 106 | Wt 153.8 lb

## 2016-12-19 DIAGNOSIS — E663 Overweight: Secondary | ICD-10-CM | POA: Diagnosis not present

## 2016-12-19 MED ORDER — CYANOCOBALAMIN 1000 MCG/ML IJ SOLN
1000.0000 ug | Freq: Once | INTRAMUSCULAR | Status: AC
  Administered 2016-12-19: 1000 ug via INTRAMUSCULAR

## 2016-12-19 NOTE — Progress Notes (Signed)
Pt is here for wt, bp check, b-12 inj She is doing well, denies any s/e  11/21/16 wt- 158lb

## 2017-01-13 DIAGNOSIS — R413 Other amnesia: Secondary | ICD-10-CM | POA: Insufficient documentation

## 2017-01-16 ENCOUNTER — Ambulatory Visit: Payer: BLUE CROSS/BLUE SHIELD

## 2017-02-17 ENCOUNTER — Other Ambulatory Visit: Payer: Self-pay | Admitting: Obstetrics and Gynecology

## 2017-06-01 DIAGNOSIS — E538 Deficiency of other specified B group vitamins: Secondary | ICD-10-CM | POA: Insufficient documentation

## 2017-12-16 ENCOUNTER — Encounter: Payer: BLUE CROSS/BLUE SHIELD | Admitting: Certified Nurse Midwife

## 2017-12-28 ENCOUNTER — Other Ambulatory Visit: Payer: Self-pay | Admitting: Obstetrics and Gynecology

## 2018-08-30 ENCOUNTER — Other Ambulatory Visit: Payer: Self-pay | Admitting: Obstetrics and Gynecology

## 2018-08-31 ENCOUNTER — Other Ambulatory Visit: Payer: Self-pay | Admitting: *Deleted

## 2018-08-31 MED ORDER — LEVONORGEST-ETH ESTRAD 91-DAY 0.15-0.03 &0.01 MG PO TABS: 1.0000 | ORAL_TABLET | Freq: Every day | ORAL | 0 refills | Status: DC

## 2018-11-03 NOTE — L&D Delivery Note (Signed)
Delivery Note  First Stage: Labor onset: 3241 Augmentation: AROM, Pitocin Analgesia /Anesthesia intrapartum: epidural AROM at 1744  Second Stage: Complete dilation at 2135 Onset of pushing at 2135 FHR second stage Cat I, 140-150bpm, moderate variability  Delivery of a viable female on 09/21/19 at 2138 by CNM delivery of fetal head in LOA position with restitution to LOT. No nuchal cord;  Anterior then posterior shoulders delivered easily with gentle downward traction. Baby placed on mom's chest, and attended to by peds.  Cord double clamped after cessation of pulsation, cut by patient. Cord blood sample collected   Third Stage: Placenta delivered spontaneously intact with [facility] @ 2144 Placenta disposition: routine disposal Uterine tone firm / bleeding scant  Intact perineum, vagina and cervix.  Anesthesia for repair: n/a Repair n/a Est. Blood Loss (mL): 991 ml  Complications: GHTN  Mom to postpartum.  Baby to Couplet care / Skin to Skin.  Newborn: Birth Weight: pending  Apgar Scores: 8/9 Feeding planned: breast/formula

## 2018-11-28 ENCOUNTER — Other Ambulatory Visit: Payer: Self-pay

## 2018-11-28 ENCOUNTER — Encounter: Payer: Self-pay | Admitting: Emergency Medicine

## 2018-11-28 ENCOUNTER — Emergency Department
Admission: EM | Admit: 2018-11-28 | Discharge: 2018-11-28 | Disposition: A | Discharge status: Home / Self Care | Payer: Managed Care, Other (non HMO) | Attending: Emergency Medicine | Admitting: Emergency Medicine

## 2018-11-28 DIAGNOSIS — G43809 Other migraine, not intractable, without status migrainosus: Secondary | ICD-10-CM | POA: Diagnosis not present

## 2018-11-28 DIAGNOSIS — F329 Major depressive disorder, single episode, unspecified: Secondary | ICD-10-CM | POA: Insufficient documentation

## 2018-11-28 DIAGNOSIS — F419 Anxiety disorder, unspecified: Secondary | ICD-10-CM | POA: Insufficient documentation

## 2018-11-28 MED ORDER — BUTALBITAL-APAP-CAFFEINE 50-325-40 MG PO TABS: 1.0000 | ORAL_TABLET | Freq: Four times a day (QID) | ORAL | 0 refills | Status: DC | PRN

## 2018-11-28 MED ORDER — SODIUM CHLORIDE 0.9 % IV SOLN
Freq: Once | INTRAVENOUS | Status: AC
  Administered 2018-11-28: 19:00:00 via INTRAVENOUS

## 2018-11-28 MED ORDER — METOCLOPRAMIDE HCL 5 MG/ML IJ SOLN
10.0000 mg | Freq: Once | INTRAMUSCULAR | Status: AC
  Administered 2018-11-28: 10 mg via INTRAVENOUS
  Filled 2018-11-28: qty 2

## 2018-11-28 MED ORDER — KETOROLAC TROMETHAMINE 30 MG/ML IJ SOLN
30.0000 mg | Freq: Once | INTRAMUSCULAR | Status: AC
  Administered 2018-11-28: 30 mg via INTRAVENOUS
  Filled 2018-11-28: qty 1

## 2018-11-28 MED ORDER — DIPHENHYDRAMINE HCL 50 MG/ML IJ SOLN
25.0000 mg | Freq: Once | INTRAMUSCULAR | Status: AC
  Administered 2018-11-28: 25 mg via INTRAVENOUS
  Filled 2018-11-28: qty 1

## 2018-11-28 NOTE — ED Triage Notes (Signed)
PT arrived with complaints of migraine that started at 1300 today. Pt reports no relief with OTC ibuprofen. No neuro deficits in triage.

## 2018-11-28 NOTE — ED Provider Notes (Signed)
[facility] Emergency Department Provider Note       Time seen: ----------------------------------------- 6:36 PM on 11/28/2018 -----------------------------------------   I have reviewed the triage vital signs and the nursing notes.  HISTORY   Chief Complaint Migraine    HPI Tammy Deleon is a 33 y.o. female with a history of anxiety, depression, UTI, irregular menstrual bleeding who presents to the ED for migraine headache that started 1:00 today.  Patient reports no relief with over-the-counter ibuprofen.  She is not complaining of any new neurologic deficits.  Pain seems to also involve the left ear.  No recent illness.  Past Medical History:  Diagnosis Date  . Anxiety   . Depression   . Irregular intermenstrual bleeding   . UTI (lower urinary tract infection)     Patient Active Problem List   Diagnosis Date Noted  . Chronic constipation 11/15/2015    Past Surgical History:  Procedure Laterality Date  . GALLBLADDER SURGERY      Allergies Sulfa antibiotics  Social History Social History   Tobacco Use  . Smoking status: Never Smoker  . Smokeless tobacco: Never Used  Substance Use Topics  . Alcohol use: Yes  . Drug use: No   Review of Systems Constitutional: Negative for fever. Cardiovascular: Negative for chest pain. Respiratory: Negative for shortness of breath. Gastrointestinal: Negative for abdominal pain, vomiting and diarrhea. Musculoskeletal: Negative for back pain. Skin: Negative for rash. Neurological: Positive for headache  All systems negative/normal/unremarkable except as stated in the HPI  ____________________________________________   PHYSICAL EXAM:  VITAL SIGNS: ED Triage Vitals [11/28/18 1801]  Enc Vitals Group     BP (!) 146/97     Pulse Rate 79     Resp 18     Temp 98 F (36.7 C)     Temp Source Oral     SpO2 100 %     Weight 152 lb (68.9 kg)     Height 5' 2"  (1.575 m)     Head  Circumference      Peak Flow      Pain Score 10     Pain Loc      Pain Edu?      Excl. in [place]?    Constitutional: Alert and oriented. Well appearing and in no distress. Eyes: Conjunctivae are normal. Normal extraocular movements.  Mild photophobia is noted ENT      Head: Normocephalic and atraumatic.      Nose: No congestion/rhinnorhea.      Mouth/Throat: Mucous membranes are moist.      Neck: No stridor. Cardiovascular: Normal rate, regular rhythm. No murmurs, rubs, or gallops. Respiratory: Normal respiratory effort without tachypnea nor retractions. Breath sounds are clear and equal bilaterally. No wheezes/rales/rhonchi. Gastrointestinal: Soft and nontender. Normal bowel sounds Musculoskeletal: Nontender with normal range of motion in extremities. No lower extremity tenderness nor edema. Neurologic:  Normal speech and language. No gross focal neurologic deficits are appreciated.  Skin:  Skin is warm, dry and intact. No rash noted. Psychiatric: Mood and affect are normal. Speech and behavior are normal.  ____________________________________________  ED COURSE:  As part of my medical decision making, I reviewed the following data within the [place] History obtained from family if available, nursing notes, old chart and ekg, as well as notes from prior ED visits. Patient presented for headache, we will provide IV headache cocktail and reevaluate.   Procedures ____________________________________________   DIFFERENTIAL DIAGNOSIS   Migraine, tension headache, dehydration  FINAL ASSESSMENT AND PLAN  Migraine   Plan: The patient had presented for migraine headache.  Patient received IV fluids, Reglan, Toradol and Benadryl with headache resolution.  She is cleared for outpatient follow-up.   Laurence Aly, MD    Note: This note was generated in part or whole with voice recognition software. Voice recognition is usually quite accurate but there are  transcription errors that can and very often do occur. I apologize for any typographical errors that were not detected and corrected.     Earleen Newport, MD 11/28/18 2020

## 2019-03-07 ENCOUNTER — Other Ambulatory Visit: Payer: Self-pay | Admitting: Obstetrics and Gynecology

## 2019-03-09 LAB — OB RESULTS CONSOLE HIV ANTIBODY (ROUTINE TESTING): HIV: NONREACTIVE

## 2019-03-09 LAB — OB RESULTS CONSOLE HEPATITIS B SURFACE ANTIGEN: Hepatitis B Surface Ag: NEGATIVE

## 2019-04-19 ENCOUNTER — Ambulatory Visit: Payer: Managed Care, Other (non HMO) | Attending: Certified Nurse Midwife | Admitting: Physical Therapy

## 2019-04-19 ENCOUNTER — Encounter: Payer: Self-pay | Admitting: Physical Therapy

## 2019-04-19 ENCOUNTER — Other Ambulatory Visit: Payer: Self-pay

## 2019-04-19 DIAGNOSIS — R293 Abnormal posture: Secondary | ICD-10-CM | POA: Diagnosis present

## 2019-04-19 DIAGNOSIS — M533 Sacrococcygeal disorders, not elsewhere classified: Secondary | ICD-10-CM | POA: Insufficient documentation

## 2019-04-19 DIAGNOSIS — R279 Unspecified lack of coordination: Secondary | ICD-10-CM | POA: Diagnosis present

## 2019-04-19 NOTE — Patient Instructions (Addendum)
Sitting with feet on ground ( reams of paper to raise floor up)   Standing: with equal weight on both leg and shoft weight more forwad to ballmounds and not heels and not locking knee    Proper body mechanics with getting out of a chair to decrease strain  on back &pelvic floor   Avoid holding your breath when Getting out of the chair:  Scoot to front part of chair chair Heels behind feet, feet are hip width apart, nose over toes  Inhale like you are smelling roses Exhale to stand     ___   Avoid straining pelvic floor, abdominal muscles , spine  Use log rolling technique instead of getting out of bed with your neck or the sit-up     Log rolling into and out of bed If getting out of bed on L side, 1) Bent knees, scoot hips/ shoulder to R   Raise L arm completely overhead, rolling onto armpit  Then lower bent knees to bed to get into complete side lying position  Then drop legs off bed, and push up onto L elbow/forearm, and use R hand to push onto the bend   ( handout ergonomics) ___  BENDING '; Minisquat: Scoot buttocks back slight, hinge like you are looking at your reflection on a pond  Knees behind toes,  Inhale to "smell flowers"  Exhale on the rise "like rocket"  Do not lock knees, have more weight across ballmounds of feet, toes relaxed     /

## 2019-04-19 NOTE — Therapy (Signed)
[place] MAIN [facility] SERVICES [address] [place], [place], [address] Phone: [phone]   Fax:  [phone]  Physical Therapy Evaluation  Patient Details  Name: Tammy Deleon MRN: [id number] Date of Birth: [birth year] No data recorded  Encounter Date: 04/19/2019    Past Medical History:  Diagnosis Date  . Anxiety   . Depression   . Irregular intermenstrual bleeding   . UTI (lower urinary tract infection)     Past Surgical History:  Procedure Laterality Date  . GALLBLADDER SURGERY      There were no vitals filed for this visit.   Subjective Assessment - 04/19/19 0925    Subjective  Pt is her 2nd trimester of her 3rd child ( due date 10/01/19) and is experiencing back pain located at the sacrum ( pt points to this region). This pain sometimes radiates to the R or L but not past the hips to the front. Denied radiating down her leg.  7/10. Triggered by bending over, sitting for 1 hour and then standing from sitting, and with sneezing. "Sometimes the pain is so bad, she feels she may drop to her knees."  Pt works at a desk job. Denied SUI. Chronic constipation : pt is drinking more water and is going daily. Prior to not drinking enough water, bowel movements occured 2-3 days. Previous injury: R  ankle injury with a broken bone last year and wore a boot 5 months.  Pt did not have back issues from wearing her boot. Did not perform sit up/ crunches. Walking for 30-45 min daily for exercise.    Pertinent History  2 vaginal deliveries with stitches    Patient Stated Goals  get back relief         [facility] PT Assessment - 04/19/19 0936      Assessment   Medical Diagnosis  back pain during 2nd trimester       Precautions   Precautions  None      Restrictions   Weight Bearing Restrictions  No      Balance Screen   Has the patient fallen in the past 6 months  No      Observation/Other Assessments   Observations  ankles crossed under  seat, slumped sitting      Squat   Comments  bending with knees locked,      Sit to Stand   Comments  genu valgus      Posture/Postural Control   Posture Comments  hyperextended knees, reported SIJ pain, cued for propioception and no pain at SIJ        AROM   Overall AROM Comments  FADDIR on L reproduced SIJ pain, tenderness/ tightness at L lateral border at S2-4 ( decreased tenderness and increased SIJ post Tx)       Strength   Overall Strength Comments  knee ext 3+/5 B otherwise BLE 4+/5 B       Palpation   Spinal mobility  L rotation, B sideflexion with reproduction of SIJ pain at S2-3 region B ( postTx:  no pain )       SI assessment   L iliac crest higher in standing. supine: L anterior rotation of iliac crest, L ankle lower        Bed Mobility   Bed Mobility  --   crunch               Objective measurements completed on examination: See above findings.    Pelvic Floor  Special Questions - 04/19/19 0944    Diastasis Recti  neg     External Perineal Exam  through clothing:  limited pelvic floor excursion/ mobility with cue for contraction        [facility] Adult PT Treatment/Exercise - 04/19/19 0936      Neuro Re-ed    Neuro Re-ed Details   cued for body mechanics , posture to relieve SIJ pain       Manual Therapy   Manual therapy comments  L distraction of LLE, AP mobs in hip flexion end range,                   PT Long Term Goals - 04/19/19 1018      PT LONG TERM GOAL #1   Title  Pt wil decrease her PGQ score from 27% to < 17% in order to return to ADLs    Time  4    Period  Weeks    Status  New    Target Date  05/17/19      PT LONG TERM GOAL #2   Title  Pt will demo proper body mechanics to minimize SIJ pain and to sit at work without pain    Time  2    Period  Weeks    Status  New    Target Date  05/03/19      PT LONG TERM GOAL #3   Title  Pt will demo equal leg length, iliac crest across 2 weeks in order to progress to deep core  coordination and strengthening    Time  2    Period  Weeks    Status  New    Target Date  05/03/19      PT LONG TERM GOAL #4   Title  Pt will demo proper pelvic floor contractions 3 sec, 5 reps and proper lengthening in order to optimize SIJ stability during pregnancy and beyond    Time  4    Period  Weeks    Status  New    Target Date  05/17/19             Plan - 04/19/19 1022    Clinical Impression Statement  Pt is a 33 yo who is in her 2nd trimester with her 3rd child and reports of sacoiliac joint pain.  Clinical presentations showed limited SIJ mobility on L, pain with spinal motions in sideflexion B, rotation L, pelvic obliquities, hyperextended knees, hip/ LE weakness, poor body mechanics that poses strain onto pelvic floor, and limited pelvic floor mobility. Following Tx today, pt demo'd equal alignment of pelvis, improved standing, sitting, log rolilng posture. Suspect L SIJ hypomobility and pain to be  Associated with postural pattern of weight bearing on L Leg when standing. Plan to progress to strengthening at next session and assess pelvic floor due lack of mobility likely associated with Hx of perineal stitches.       Personal Factors and Comorbidities  Profession;Age;Other    Examination-Activity Limitations  Squat;Sit;Transfers;Stand    Stability/Clinical Decision Making  Stable/Uncomplicated    Clinical Decision Making  Low    Rehab Potential  Good    PT Frequency  1x / week    PT Duration  4 weeks    PT Treatment/Interventions  Gait training;Neuromuscular re-education;Moist Heat;Therapeutic activities;Therapeutic exercise;Patient/family education;Manual techniques;Scar mobilization    Consulted and Agree with Plan of Care  Patient       Patient will benefit from skilled therapeutic intervention in order  to improve the following deficits and impairments:  Abnormal gait, Improper body mechanics, Pain, Postural dysfunction, Decreased strength, Decreased endurance,  Decreased coordination  Visit Diagnosis: 1. Sacrococcygeal disorders, not elsewhere classified   2. Abnormal posture   3. Lack of coordination        Problem List Patient Active Problem List   Diagnosis Date Noted  . Chronic constipation 11/15/2015    Jerl Mina ,PT, DPT, E-RYT  04/19/2019, 10:24 AM  [place] MAIN [facility] SERVICES [address] [place], [place], [address] Phone: [phone]   Fax:  [phone]  Name: Tammy Deleon MRN: [id number] Date of Birth: 09-Jun-1986

## 2019-04-26 ENCOUNTER — Ambulatory Visit: Payer: Managed Care, Other (non HMO) | Admitting: Physical Therapy

## 2019-05-04 ENCOUNTER — Ambulatory Visit: Payer: Managed Care, Other (non HMO) | Attending: Certified Nurse Midwife | Admitting: Physical Therapy

## 2019-09-08 LAB — OB RESULTS CONSOLE GBS: GBS: NEGATIVE

## 2019-09-08 LAB — OB RESULTS CONSOLE GC/CHLAMYDIA
Chlamydia: NEGATIVE
Gonorrhea: NEGATIVE

## 2019-09-08 LAB — OB RESULTS CONSOLE RPR: RPR: NONREACTIVE

## 2019-09-08 LAB — OB RESULTS CONSOLE HIV ANTIBODY (ROUTINE TESTING): HIV: NONREACTIVE

## 2019-09-21 ENCOUNTER — Other Ambulatory Visit: Payer: Self-pay

## 2019-09-21 ENCOUNTER — Inpatient Hospital Stay: Payer: Managed Care, Other (non HMO) | Admitting: Anesthesiology

## 2019-09-21 ENCOUNTER — Inpatient Hospital Stay
Admission: EM | Admit: 2019-09-21 | Discharge: 2019-09-23 | DRG: 807 | Disposition: A | Discharge status: Home / Self Care | Payer: Managed Care, Other (non HMO) | Attending: Obstetrics and Gynecology | Admitting: Obstetrics and Gynecology

## 2019-09-21 DIAGNOSIS — O134 Gestational [pregnancy-induced] hypertension without significant proteinuria, complicating childbirth: Principal | ICD-10-CM | POA: Diagnosis present

## 2019-09-21 DIAGNOSIS — O133 Gestational [pregnancy-induced] hypertension without significant proteinuria, third trimester: Secondary | ICD-10-CM | POA: Diagnosis present

## 2019-09-21 DIAGNOSIS — Z3A38 38 weeks gestation of pregnancy: Secondary | ICD-10-CM | POA: Diagnosis not present

## 2019-09-21 DIAGNOSIS — R030 Elevated blood-pressure reading, without diagnosis of hypertension: Secondary | ICD-10-CM | POA: Diagnosis present

## 2019-09-21 DIAGNOSIS — Z20828 Contact with and (suspected) exposure to other viral communicable diseases: Secondary | ICD-10-CM | POA: Diagnosis present

## 2019-09-21 LAB — COMPREHENSIVE METABOLIC PANEL
ALT: 14 U/L (ref 0–44)
AST: 17 U/L (ref 15–41)
Albumin: 3.1 g/dL — ABNORMAL LOW (ref 3.5–5.0)
Alkaline Phosphatase: 393 U/L — ABNORMAL HIGH (ref 38–126)
Anion gap: 11 (ref 5–15)
BUN: 13 mg/dL (ref 6–20)
CO2: 20 mmol/L — ABNORMAL LOW (ref 22–32)
Calcium: 9.4 mg/dL (ref 8.9–10.3)
Chloride: 106 mmol/L (ref 98–111)
Creatinine, Ser: 0.98 mg/dL (ref 0.44–1.00)
GFR calc Af Amer: >60 mL/min (ref >60)
GFR calc non Af Amer: >60 mL/min (ref >60)
Glucose, Bld: 78 mg/dL (ref 70–99)
Potassium: 3.6 mmol/L (ref 3.5–5.1)
Sodium: 137 mmol/L (ref 135–145)
Total Bilirubin: 0.7 mg/dL (ref 0.3–1.2)
Total Protein: 6.8 g/dL (ref 6.5–8.1)

## 2019-09-21 LAB — CBC
HCT: 33.7 % — ABNORMAL LOW (ref 36.0–46.0)
Hemoglobin: 11.1 g/dL — ABNORMAL LOW (ref 12.0–15.0)
MCH: 26.9 pg (ref 26.0–34.0)
MCHC: 32.9 g/dL (ref 30.0–36.0)
MCV: 81.6 fL (ref 80.0–100.0)
Platelets: 336 10*3/uL (ref 150–400)
RBC: 4.13 MIL/uL (ref 3.87–5.11)
RDW: 13.2 % (ref 11.5–15.5)
WBC: 12.2 10*3/uL — ABNORMAL HIGH (ref 4.0–10.5)
nRBC: 0 % (ref 0.0–0.2)

## 2019-09-21 LAB — SARS CORONAVIRUS 2 BY RT PCR (HOSPITAL ORDER, PERFORMED IN ~~LOC~~ HOSPITAL LAB): SARS Coronavirus 2: NEGATIVE

## 2019-09-21 LAB — PROTEIN / CREATININE RATIO, URINE
Creatinine, Urine: 208 mg/dL
Protein Creatinine Ratio: 0.1 mg/mg{Cre} (ref 0.00–0.15)
Total Protein, Urine: 21 mg/dL

## 2019-09-21 LAB — TYPE AND SCREEN
ABO/RH(D): O POS
Antibody Screen: NEGATIVE

## 2019-09-21 MED ORDER — LABETALOL HCL 5 MG/ML IV SOLN: 20.0000 mg | INTRAVENOUS | Status: DC | PRN

## 2019-09-21 MED ORDER — METOCLOPRAMIDE HCL 10 MG PO TABS
10.0000 mg | ORAL_TABLET | Freq: Once | ORAL | Status: DC
  Filled 2019-09-21: qty 1

## 2019-09-21 MED ORDER — SOD CITRATE-CITRIC ACID 500-334 MG/5ML PO SOLN: 30.0000 mL | ORAL | Status: DC | PRN

## 2019-09-21 MED ORDER — OXYTOCIN 40 UNITS IN NORMAL SALINE INFUSION - SIMPLE MED
1.0000 m[IU]/min | INTRAVENOUS | Status: DC
  Administered 2019-09-21: 2 m[IU]/min via INTRAVENOUS
  Filled 2019-09-21: qty 1000

## 2019-09-21 MED ORDER — LACTATED RINGERS IV SOLN
INTRAVENOUS | Status: DC
  Administered 2019-09-21 (×2): via INTRAVENOUS

## 2019-09-21 MED ORDER — OXYTOCIN BOLUS FROM INFUSION
500.0000 mL | Freq: Once | INTRAVENOUS | Status: AC
  Administered 2019-09-21: 500 mL via INTRAVENOUS

## 2019-09-21 MED ORDER — ONDANSETRON HCL 4 MG/2ML IJ SOLN: 4.0000 mg | Freq: Four times a day (QID) | INTRAMUSCULAR | Status: DC | PRN

## 2019-09-21 MED ORDER — PHENYLEPHRINE 40 MCG/ML (10ML) SYRINGE FOR IV PUSH (FOR BLOOD PRESSURE SUPPORT)
80.0000 ug | PREFILLED_SYRINGE | INTRAVENOUS | Status: DC | PRN
  Filled 2019-09-21: qty 10

## 2019-09-21 MED ORDER — LIDOCAINE HCL (PF) 1 % IJ SOLN
INTRAMUSCULAR | Status: AC
  Filled 2019-09-21: qty 30

## 2019-09-21 MED ORDER — OXYTOCIN 10 UNIT/ML IJ SOLN
INTRAMUSCULAR | Status: AC
  Filled 2019-09-21: qty 2

## 2019-09-21 MED ORDER — HYDRALAZINE HCL 20 MG/ML IJ SOLN: 10.0000 mg | INTRAMUSCULAR | Status: DC | PRN

## 2019-09-21 MED ORDER — OXYTOCIN 40 UNITS IN NORMAL SALINE INFUSION - SIMPLE MED: 2.5000 [IU]/h | INTRAVENOUS | Status: DC

## 2019-09-21 MED ORDER — FENTANYL 2.5 MCG/ML W/ROPIVACAINE 0.15% IN NS 100 ML EPIDURAL (ARMC)
EPIDURAL | Status: AC
  Filled 2019-09-21: qty 100

## 2019-09-21 MED ORDER — LABETALOL HCL 5 MG/ML IV SOLN: 40.0000 mg | INTRAVENOUS | Status: DC | PRN

## 2019-09-21 MED ORDER — LIDOCAINE HCL (PF) 1 % IJ SOLN
30.0000 mL | INTRAMUSCULAR | Status: AC | PRN
  Administered 2019-09-21: 1.2 mL via SUBCUTANEOUS

## 2019-09-21 MED ORDER — IBUPROFEN 600 MG PO TABS
600.0000 mg | ORAL_TABLET | Freq: Four times a day (QID) | ORAL | Status: DC
  Administered 2019-09-22 – 2019-09-23 (×6): 600 mg via ORAL
  Filled 2019-09-21 (×6): qty 1

## 2019-09-21 MED ORDER — TERBUTALINE SULFATE 1 MG/ML IJ SOLN: 0.2500 mg | Freq: Once | INTRAMUSCULAR | Status: DC | PRN

## 2019-09-21 MED ORDER — FENTANYL CITRATE (PF) 100 MCG/2ML IJ SOLN: 50.0000 ug | INTRAMUSCULAR | Status: DC | PRN

## 2019-09-21 MED ORDER — LACTATED RINGERS IV SOLN: INTRAVENOUS | Status: DC

## 2019-09-21 MED ORDER — DIPHENHYDRAMINE HCL 50 MG/ML IJ SOLN: 12.5000 mg | INTRAMUSCULAR | Status: DC | PRN

## 2019-09-21 MED ORDER — FAMOTIDINE 20 MG PO TABS: 40.0000 mg | ORAL_TABLET | Freq: Once | ORAL | Status: DC

## 2019-09-21 MED ORDER — LACTATED RINGERS IV SOLN: 500.0000 mL | Freq: Once | INTRAVENOUS | Status: DC

## 2019-09-21 MED ORDER — OXYCODONE-ACETAMINOPHEN 5-325 MG PO TABS: 2.0000 | ORAL_TABLET | ORAL | Status: DC | PRN

## 2019-09-21 MED ORDER — BENZOCAINE-MENTHOL 20-0.5 % EX AERO: 1.0000 "application " | INHALATION_SPRAY | CUTANEOUS | Status: DC | PRN

## 2019-09-21 MED ORDER — LACTATED RINGERS IV SOLN: 500.0000 mL | INTRAVENOUS | Status: DC | PRN

## 2019-09-21 MED ORDER — EPHEDRINE 5 MG/ML INJ
10.0000 mg | INTRAVENOUS | Status: DC | PRN
  Filled 2019-09-21: qty 2

## 2019-09-21 MED ORDER — FENTANYL 2.5 MCG/ML W/ROPIVACAINE 0.15% IN NS 100 ML EPIDURAL (ARMC): 12.0000 mL/h | EPIDURAL | Status: DC

## 2019-09-21 MED ORDER — FENTANYL 2.5 MCG/ML W/ROPIVACAINE 0.15% IN NS 100 ML EPIDURAL (ARMC)
EPIDURAL | Status: DC | PRN
  Administered 2019-09-21: 12 mL/h via EPIDURAL

## 2019-09-21 MED ORDER — ACETAMINOPHEN 325 MG PO TABS: 650.0000 mg | ORAL_TABLET | ORAL | Status: DC | PRN

## 2019-09-21 MED ORDER — LABETALOL HCL 5 MG/ML IV SOLN: 80.0000 mg | INTRAVENOUS | Status: DC | PRN

## 2019-09-21 MED ORDER — MISOPROSTOL 200 MCG PO TABS
ORAL_TABLET | ORAL | Status: AC
  Filled 2019-09-21: qty 4

## 2019-09-21 MED ORDER — AMMONIA AROMATIC IN INHA
RESPIRATORY_TRACT | Status: AC
  Filled 2019-09-21: qty 10

## 2019-09-21 MED ORDER — LIDOCAINE-EPINEPHRINE (PF) 1.5 %-1:200000 IJ SOLN
INTRAMUSCULAR | Status: DC | PRN
  Administered 2019-09-21: 3 mL via EPIDURAL

## 2019-09-21 NOTE — H&P (Signed)
OB History & Physical   History of Present Illness:  Chief Complaint: elevated BP in office today  HPI:  Tammy Deleon is a 33 y.o. G55P2002 female at 52w4ddated by [place] 733w2d She presents to L&D for IOl due to elevated BP at term.   Reports active FM; having irreg UCs since appt in office today. Denies LOF or VB.   Pregnancy Issues: 1. Desires BTL   Maternal Medical History:   Past Medical History:  Diagnosis Date  . Anxiety   . Depression   . Irregular intermenstrual bleeding   . UTI (lower urinary tract infection)     Past Surgical History:  Procedure Laterality Date  . GALLBLADDER SURGERY      Allergies  Allergen Reactions  . Sulfa Antibiotics     Prior to Admission medications   Medication Sig Start Date End Date Taking? Authorizing Provider  traZODone (DESYREL) 100 MG tablet Take 100 mg by mouth at bedtime.   Yes [provider]  ALPRAZolam (XDuanne Moron1 MG tablet Take 1 mg by mouth at bedtime as needed for anxiety.    [provider]  butalbital-acetaminophen-caffeine (FIORICET, ESGIC) 50-325-40 MG tablet Take 1-2 tablets by mouth every 6 (six) hours as needed. Patient not taking: Reported on 04/19/2019 11/28/18 11/28/19  Tammy Deleon  cyanocobalamin (,VITAMIN B-12,) 1000 MCG/ML injection Inject 1 mL (1,000 mcg total) into the muscle every 30 (thirty) days. Patient not taking: Reported on 04/19/2019 11/21/16   Tammy IgoCNM  Levonorgestrel-Ethinyl Estradiol (AMETHIA) 0.15-0.03 &0.01 MG tablet TAKE 1 TABLET BY MOUTH EVERY DAY Patient not taking: Reported on 04/19/2019 03/07/19   Shambley, Melody N, CNM  Levonorgestrel-Ethinyl Estradiol (AMETHIA,CAMRESE) 0.15-0.03 &0.01 MG tablet Take 1 tablet by mouth daily. Patient not taking: Reported on 04/19/2019 08/31/18   Shambley, Melody N, CNM  LINZESS 145 MCG CAPS capsule TAKE 1 CAPSULE (145 MCG TOTAL) BY MOUTH DAILY. Patient not taking: Reported on 04/19/2019 12/04/16   Tammy Deleon CNM  phentermine (ADIPEX-P) 37.5 MG tablet Take 1 tablet (37.5 mg total) by mouth daily before breakfast. Patient not taking: Reported on 04/19/2019 11/21/16   Shambley, Melody N, CNM  pyridOXINE (VITAMIN B-6) 100 MG tablet Take 100 mg by mouth daily.    [provider]  sertraline (ZOLOFT) 100 MG tablet Take 100 mg by mouth daily.    [provider]     Prenatal care site: [facility] Social History: She  reports that she has never smoked. She has never used smokeless tobacco. She reports current alcohol use. She reports that she does not use drugs.  Family History: no family hx Gyn cancers  Review of Systems: A full review of systems was performed and negative except as noted in the HPI.     Physical Exam:  Vital Signs: BP 136/89   Pulse 93   Temp 98.8 F (37.1 C) (Oral)   Resp 18   Ht 5' 2"  (1.575 m)   Wt 72.1 kg   LMP 11/09/2018   SpO2 99%   BMI 29.08 kg/m  General: no acute distress.  HEENT: normocephalic, atraumatic Heart: regular rate & rhythm.  No murmurs/rubs/gallops Lungs: clear to auscultation bilaterally, normal respiratory effort Abdomen: soft, gravid, non-tender;  EFW: 7lbs Pelvic:   External: Normal external female genitalia  Cervix: Dilation: 4 / Effacement (%): 50 / Station: -1  AROM performed, scant amount clear fluid with dark red bloody show.    Extremities: non-tender,  symmetric, no edema bilaterally.  DTRs: 2+  Neurologic: Alert & oriented x 3.    Results for orders placed or performed during the hospital encounter of 09/21/19 (from the past 24 hour(s))  CBC     Status: Abnormal   Collection Time: 09/21/19  3:02 PM  Result Value Ref Range   WBC 12.2 (H) 4.0 - 10.5 K/uL   RBC 4.13 3.87 - 5.11 MIL/uL   Hemoglobin 11.1 (L) 12.0 - 15.0 g/dL   HCT 33.7 (L) 36.0 - 46.0 %   MCV 81.6 80.0 - 100.0 fL   MCH 26.9 26.0 - 34.0 pg   MCHC 32.9 30.0 - 36.0 g/dL   RDW 13.2 11.5 - 15.5 %   Platelets 336 150 - 400 K/uL   nRBC 0.0  0.0 - 0.2 %  Type and screen Tammy Deleon     Status: None   Collection Time: 09/21/19  3:02 PM  Result Value Ref Range   ABO/RH(D) O POS    Antibody Screen NEG    Sample Expiration      09/24/2019,2359 Performed at [facility], [address] [place], [place] [address]   Comprehensive metabolic panel     Status: Abnormal   Collection Time: 09/21/19  3:02 PM  Result Value Ref Range   Sodium 137 135 - 145 mmol/L   Potassium 3.6 3.5 - 5.1 mmol/L   Chloride 106 98 - 111 mmol/L   CO2 20 (L) 22 - 32 mmol/L   Glucose, Bld 78 70 - 99 mg/dL   BUN 13 6 - 20 mg/dL   Creatinine, Ser 0.98 0.44 - 1.00 mg/dL   Calcium 9.4 8.9 - 10.3 mg/dL   Total Protein 6.8 6.5 - 8.1 g/dL   Albumin 3.1 (L) 3.5 - 5.0 g/dL   AST 17 15 - 41 U/L   ALT 14 0 - 44 U/L   Alkaline Phosphatase 393 (H) 38 - 126 U/L   Total Bilirubin 0.7 0.3 - 1.2 mg/dL   GFR calc non Af Amer >60 >60 mL/min   GFR calc Af Amer >60 >60 mL/min   Anion gap 11 5 - 15  Protein / creatinine ratio, urine     Status: None   Collection Time: 09/21/19  3:02 PM  Result Value Ref Range   Creatinine, Urine 208 mg/dL   Total Protein, Urine 21 mg/dL   Protein Creatinine Ratio 0.10 0.00 - 0.15 mg/mg[Cre]  SARS Coronavirus 2 by RT PCR (hospital order, performed in [place] hospital lab) Nasopharyngeal Nasopharyngeal Swab     Status: None   Collection Time: 09/21/19  3:54 PM   Specimen: Nasopharyngeal Swab  Result Value Ref Range   SARS Coronavirus 2 NEGATIVE NEGATIVE    Pertinent Results:  Prenatal Labs: Blood type/Rh O pos  Antibody screen neg  Rubella Immune  Varicella Immune  RPR NR  HBsAg Neg  HIV NR  GC neg  Chlamydia neg  Genetic screening negative  1 hour GTT  93  GBS  negative   FHT: 150bpm, moderate variability, + accels, no decels TOCO: q2-62mn, palpate mod SVE:  Dilation: 4 / Effacement (%): 50 / Station: -1    Cephalic by leopolds  No results found.  Assessment:  HNOVIA LANSBERRYis a 33y.o. [id number] at 370w4dith IOL for GHTN.   Plan:  1. Admit to Labor & Delivery; consents reviewed and obtained - COVID negative  2. Fetal Well being  - Fetal Tracing: cat  I tracing, initially tacycardic on admission - Group B Streptococcus ppx indicated: Negative - Presentation: cephalic confirmed by exam and Leopolds   3. Routine OB:  - Prenatal labs reviewed, as above - Rh O pos - CBC, T&S, RPR on admit - Clear fluids, IVF  4. Induction of Labor -  Contractions- external toco in place -  Pelvis proven to 7lbs5oz -  Plan for induction with Pitocin, AROM -  Plan for continuous fetal monitoring  -  Maternal pain control as desired; requesting regional anesthesia - Anticipate vaginal delivery  5. Post Partum Planning: - Infant feeding: breast - Contraception: [place], CNM 09/21/19 5:54 PM

## 2019-09-21 NOTE — Anesthesia Preprocedure Evaluation (Signed)
Anesthesia Evaluation  Patient identified by MRN, date of birth, ID band Patient awake    Reviewed: Allergy & Precautions, NPO status , Unable to perform ROS - Chart review only  History of Anesthesia Complications Negative for: history of anesthetic complications  Airway Mallampati: II       Dental   Pulmonary neg sleep apnea, neg COPD, Not current smoker,           Cardiovascular hypertension (gestational), (-) Past MI and (-) CHF (-) dysrhythmias (-) Valvular Problems/Murmurs     Neuro/Psych neg Seizures Anxiety Depression    GI/Hepatic Neg liver ROS, GERD (mild with pregnacy)  ,  Endo/Other  neg diabetes  Renal/GU negative Renal ROS     Musculoskeletal   Abdominal   Peds  Hematology   Anesthesia Other Findings   Reproductive/Obstetrics (+) Pregnancy                             Anesthesia Physical Anesthesia Plan  ASA: II  Anesthesia Plan: Epidural   Post-op Pain Management:    Induction:   PONV Risk Score and Plan:   Airway Management Planned:   Additional Equipment:   Intra-op Plan:   Post-operative Plan:   Informed Consent: I have reviewed the patients History and Physical, chart, labs and discussed the procedure including the risks, benefits and alternatives for the proposed anesthesia with the patient or authorized representative who has indicated his/her understanding and acceptance.       Plan Discussed with:   Anesthesia Plan Comments:         Anesthesia Quick Evaluation

## 2019-09-21 NOTE — Discharge Summary (Signed)
Obstetrical Discharge Summary  Tammy Deleon Name: Tammy Deleon DOB: [birth year] MRN: [id number]  Date of Admission: 09/21/2019 Date of Delivery: 09/21/19 Delivered by: Hassan Buckler CNM Date of Discharge: 09/23/2019  Primary OB: [place]  OIZ:[id number]'X last menstrual period was 11/09/2018. EDC Estimated Date of Delivery: 10/01/19 Gestational Age at Delivery: [redacted]w[redacted]d  Antepartum complications:  1. Desires BTL 2. GHTN at 38.4wks--> IOL  Admitting Diagnosis: GHTN Secondary Diagnosis: SVD  Tammy Deleon Active Problem List   Diagnosis Date Noted  . Gestational hypertension w/o significant proteinuria in 3rd trimester 09/21/2019  . Chronic constipation 11/15/2015    Augmentation: AROM and Pitocin Complications: None Intrapartum complications/course: admitted with mild range BPs, pitocin and AROM. Normal labs, SVD.  Date of Delivery: 09/21/19 Delivered by: RHassan BucklerCNM Delivery Type: spontaneous vaginal delivery Anesthesia: epidural Placenta: spontaneous Laceration: none Episiotomy: none Newborn Data: Live born female "Sadie" Birth Weight:  pending APGAR: 8, 9  Newborn Delivery   Birth date/time: 09/21/2019 21:38:00 Delivery type: Vaginal, Spontaneous        Postpartum Procedures: none  Post partum course:  Tammy Deleon had an uncomplicated postpartum course.  By time of discharge on PPD#2, her pain was controlled on oral pain medications; she had appropriate lochia and was ambulating, voiding without difficulty and tolerating regular diet.  She was deemed stable for discharge to home.    Discharge Physical Exam:  BP 122/83 (BP Location: Left Arm)   Pulse 71   Temp 98.8 F (37.1 C) (Oral)   Resp 18   Ht 5' 2"  (1.575 m)   Wt 72.1 kg   LMP 11/09/2018   SpO2 100% Comment: Room Air  Breastfeeding Unknown   BMI 29.08 kg/m   General: NAD CV: RRR Pulm: CTABL, nl effort ABD: s/nd/nt, fundus firm and below the umbilicus Lochia: minimal Perineum:  intact DVT Evaluation: LE non-ttp, no evidence of DVT on exam.  Hemoglobin  Date Value Ref Range Status  09/23/2019 9.5 (L) 12.0 - 15.0 g/dL Final   HGB  Date Value Ref Range Status  09/25/2013 13.8 12.0 - 16.0 g/dL Final   HCT  Date Value Ref Range Status  09/23/2019 28.6 (L) 36.0 - 46.0 % Final  09/25/2013 40.3 35.0 - 47.0 % Final     Disposition: stable, discharge to home. Baby Feeding: breastmilk Baby Disposition: home with mom  Rh Immune globulin given: n/a O pos Rubella vaccine given: immune Varicella vaccine given: immune Tdap vaccine given in AP setting: 07/27/19 Flu vaccine given in AP setting: 07/21/19  Contraception: desires BTL outpatient  Prenatal Labs:  Blood type/Rh O pos  Antibody screen neg  Rubella Immune  Varicella Immune  RPR NR  HBsAg Neg  HIV NR  GC neg  Chlamydia neg  Genetic screening negative  1 hour GTT  93  GBS  negative      Plan:  Tammy Schlatterwas discharged to home in good condition. Follow up appointment early next week for blood pressure and mood check Follow up with Dr. BLeafy Roin 2 weeks for BTL pre-op appt Follow-up appointment with delivering provider in 6 weeks.  Discharge Medications: Allergies as of 09/23/2019      Reactions   Sulfa Antibiotics       Medication List    STOP taking these medications   butalbital-acetaminophen-caffeine 50-325-40 MG tablet Commonly known as: FIORICET   cyanocobalamin 1000 MCG/ML injection Commonly known as: (VITAMIN B-12)   Levonorgestrel-Ethinyl Estradiol 0.15-0.03 &0.01 MG tablet Commonly known as: AMETHIA   Linzess  145 MCG Caps capsule Generic drug: linaclotide   phentermine 37.5 MG tablet Commonly known as: Adipex-P   pyridOXINE 100 MG tablet Commonly known as: VITAMIN B-6     TAKE these medications   ALPRAZolam 1 MG tablet Commonly known as: XANAX Take 1 mg by mouth at bedtime as needed for anxiety.   sertraline 100 MG tablet Commonly known as:  ZOLOFT Take 100 mg by mouth daily.   traZODone 100 MG tablet Commonly known as: DESYREL Take 100 mg by mouth at bedtime.       Follow-up Information    McVey, Murray Hodgkins, CNM Follow up in 6 week(s).   Specialty: Obstetrics and Gynecology Contact information: [place] [address] [phone]        Benjaman Kindler, MD. Schedule an appointment as soon as possible for a visit in 2 week(s).   Specialty: Obstetrics and Gynecology Why: preop visit for laparoscopic tubal ligation Contact information: [place] [address] [phone]        [facility]. Schedule an appointment as soon as possible for a visit.   Why: Schedule an appt early next week for a blood pressure check Contact information: [place] [phone]          Signed:  Clydene Laming, CNM 09/23/2019  8:44 AM

## 2019-09-21 NOTE — Anesthesia Procedure Notes (Signed)
Epidural Patient location during procedure: OB Start time: 09/21/2019 7:00 PM End time: 09/21/2019 7:20 PM  Staffing Performed: anesthesiologist   Preanesthetic Checklist Completed: patient identified, site marked, surgical consent, pre-op evaluation, timeout performed, IV checked, risks and benefits discussed and monitors and equipment checked  Epidural Patient position: sitting Prep: Betadine Patient monitoring: heart rate, continuous pulse ox and blood pressure Approach: midline Location: L4-L5 Injection technique: LOR saline  Needle:  Needle type: Tuohy  Needle gauge: 17 G Needle length: 9 cm and 9 Needle insertion depth: 7 cm Catheter type: closed end flexible Catheter size: 20 Guage Catheter at skin depth: 12 cm Test dose: negative and 1.5% lidocaine with Epi 1:200 K  Assessment Events: blood not aspirated, injection not painful, no injection resistance, negative IV test and no paresthesia  Additional Notes   Patient tolerated the insertion well without complications.Reason for block:procedure for pain

## 2019-09-22 ENCOUNTER — Encounter: Payer: Self-pay | Admitting: Anesthesiology

## 2019-09-22 ENCOUNTER — Encounter: Payer: Self-pay | Admitting: *Deleted

## 2019-09-22 ENCOUNTER — Encounter
Admission: EM | Disposition: A | Discharge status: Home / Self Care | Payer: Self-pay | Source: Home / Self Care | Attending: Obstetrics and Gynecology

## 2019-09-22 LAB — COMPREHENSIVE METABOLIC PANEL
ALT: 11 U/L (ref 0–44)
AST: 17 U/L (ref 15–41)
Albumin: 2.5 g/dL — ABNORMAL LOW (ref 3.5–5.0)
Alkaline Phosphatase: 311 U/L — ABNORMAL HIGH (ref 38–126)
Anion gap: 7 (ref 5–15)
BUN: 8 mg/dL (ref 6–20)
CO2: 22 mmol/L (ref 22–32)
Calcium: 8.6 mg/dL — ABNORMAL LOW (ref 8.9–10.3)
Chloride: 107 mmol/L (ref 98–111)
Creatinine, Ser: 0.69 mg/dL (ref 0.44–1.00)
GFR calc Af Amer: >60 mL/min (ref >60)
GFR calc non Af Amer: >60 mL/min (ref >60)
Glucose, Bld: 81 mg/dL (ref 70–99)
Potassium: 3.7 mmol/L (ref 3.5–5.1)
Sodium: 136 mmol/L (ref 135–145)
Total Bilirubin: 0.7 mg/dL (ref 0.3–1.2)
Total Protein: 5.4 g/dL — ABNORMAL LOW (ref 6.5–8.1)

## 2019-09-22 LAB — CBC
HCT: 30.2 % — ABNORMAL LOW (ref 36.0–46.0)
Hemoglobin: 9.7 g/dL — ABNORMAL LOW (ref 12.0–15.0)
MCH: 27 pg (ref 26.0–34.0)
MCHC: 32.1 g/dL (ref 30.0–36.0)
MCV: 84.1 fL (ref 80.0–100.0)
Platelets: 287 10*3/uL (ref 150–400)
RBC: 3.59 MIL/uL — ABNORMAL LOW (ref 3.87–5.11)
RDW: 13.2 % (ref 11.5–15.5)
WBC: 18.7 10*3/uL — ABNORMAL HIGH (ref 4.0–10.5)
nRBC: 0 % (ref 0.0–0.2)

## 2019-09-22 LAB — RPR: RPR Ser Ql: NONREACTIVE

## 2019-09-22 SURGERY — LIGATION, FALLOPIAN TUBE, BILATERAL
Anesthesia: Choice | Laterality: Bilateral

## 2019-09-22 MED ORDER — SENNOSIDES-DOCUSATE SODIUM 8.6-50 MG PO TABS
2.0000 | ORAL_TABLET | ORAL | Status: DC
  Administered 2019-09-22: 2 via ORAL
  Filled 2019-09-22 (×2): qty 2

## 2019-09-22 MED ORDER — DIPHENHYDRAMINE HCL 25 MG PO CAPS: 25.0000 mg | ORAL_CAPSULE | Freq: Four times a day (QID) | ORAL | Status: DC | PRN

## 2019-09-22 MED ORDER — COCONUT OIL OIL: 1.0000 "application " | TOPICAL_OIL | Status: DC | PRN

## 2019-09-22 MED ORDER — DIBUCAINE (PERIANAL) 1 % EX OINT: 1.0000 "application " | TOPICAL_OINTMENT | CUTANEOUS | Status: DC | PRN

## 2019-09-22 MED ORDER — ACETAMINOPHEN 325 MG PO TABS: 650.0000 mg | ORAL_TABLET | ORAL | Status: DC | PRN

## 2019-09-22 MED ORDER — ONDANSETRON HCL 4 MG PO TABS: 4.0000 mg | ORAL_TABLET | ORAL | Status: DC | PRN

## 2019-09-22 MED ORDER — SIMETHICONE 80 MG PO CHEW: 80.0000 mg | CHEWABLE_TABLET | ORAL | Status: DC | PRN

## 2019-09-22 MED ORDER — ONDANSETRON HCL 4 MG/2ML IJ SOLN: 4.0000 mg | INTRAMUSCULAR | Status: DC | PRN

## 2019-09-22 MED ORDER — PRENATAL MULTIVITAMIN CH
1.0000 | ORAL_TABLET | Freq: Every day | ORAL | Status: DC
  Administered 2019-09-22: 1 via ORAL
  Filled 2019-09-22: qty 1

## 2019-09-22 MED ORDER — WITCH HAZEL-GLYCERIN EX PADS: 1.0000 "application " | MEDICATED_PAD | CUTANEOUS | Status: DC | PRN

## 2019-09-22 MED ORDER — ZOLPIDEM TARTRATE 5 MG PO TABS: 5.0000 mg | ORAL_TABLET | Freq: Every evening | ORAL | Status: DC | PRN

## 2019-09-22 SURGICAL SUPPLY — 31 items
BLADE SURG SZ11 CARB STEEL (BLADE) ×2 IMPLANT
CANISTER SUCT 1200ML W/VALVE (MISCELLANEOUS) ×2 IMPLANT
CHLORAPREP W/TINT 26 (MISCELLANEOUS) ×2 IMPLANT
COVER WAND RF STERILE (DRAPES) ×2 IMPLANT
DERMABOND ADVANCED (GAUZE/BANDAGES/DRESSINGS) ×1
DERMABOND ADVANCED .7 DNX12 (GAUZE/BANDAGES/DRESSINGS) ×1 IMPLANT
DRAPE LAPAROTOMY 100X77 ABD (DRAPES) ×2 IMPLANT
ELECT REM PT RETURN 9FT ADLT (ELECTROSURGICAL) ×2
ELECTRODE REM PT RTRN 9FT ADLT (ELECTROSURGICAL) ×1 IMPLANT
GLOVE BIO SURGEON STRL SZ7 (GLOVE) ×2 IMPLANT
GLOVE INDICATOR 7.5 STRL GRN (GLOVE) ×2 IMPLANT
GOWN STRL REUS W/ TWL LRG LVL3 (GOWN DISPOSABLE) ×1 IMPLANT
GOWN STRL REUS W/TWL LRG LVL3 (GOWN DISPOSABLE) ×1
KIT TURNOVER CYSTO (KITS) ×2 IMPLANT
LABEL OR SOLS (LABEL) ×2 IMPLANT
LIGASURE IMPACT 36 18CM CVD LR (INSTRUMENTS) IMPLANT
NEEDLE HYPO 22GX1.5 SAFETY (NEEDLE) ×2 IMPLANT
NS IRRIG 500ML POUR BTL (IV SOLUTION) ×2 IMPLANT
PACK BASIN MINOR ARMC (MISCELLANEOUS) ×2 IMPLANT
PAD OB MATERNITY 4.3X12.25 (PERSONAL CARE ITEMS) ×2 IMPLANT
RETRACTOR WOUND ALXS 18CM SML (MISCELLANEOUS) ×1 IMPLANT
RTRCTR WOUND ALEXIS O 18CM SML (MISCELLANEOUS) ×2
SPONGE LAP 4X18 RFD (DISPOSABLE) ×2 IMPLANT
SUT CHROMIC GUT BROWN 0 54 (SUTURE) ×1 IMPLANT
SUT CHROMIC GUT BROWN 0 54IN (SUTURE) ×2
SUT MNCRL 4-0 (SUTURE) ×1
SUT MNCRL 4-0 27XMFL (SUTURE) ×1
SUT VIC AB 0 CT2 27 (SUTURE) ×2 IMPLANT
SUT VICRYL 0 AB UR-6 (SUTURE) ×4 IMPLANT
SUTURE MNCRL 4-0 27XMF (SUTURE) ×1 IMPLANT
SYR 10ML LL (SYRINGE) ×2 IMPLANT

## 2019-09-22 NOTE — Progress Notes (Signed)
Post Partum Day 1 Subjective: no complaints and up ad lib  Objective: Blood pressure 109/70, pulse 71, temperature 98.6 F (37 C), temperature source Oral, resp. rate 18, height 5' 2"  (1.575 m), weight 72.1 kg, last menstrual period 11/09/2018, SpO2 99 %, unknown if currently breastfeeding.  Physical Exam:  General: alert, cooperative and appears stated age 33: appropriate Uterine Fundus: firm DVT Evaluation: No evidence of DVT seen on physical exam.  Recent Labs    09/21/19 1502 09/22/19 0550  HGB 11.1* 9.7*  HCT 33.7* 30.2*    Assessment/Plan: Plan for discharge tomorrow and Contraception interval tubal   LOS: 1 day   Tammy Deleon 09/22/2019, 12:42 PM

## 2019-09-22 NOTE — Anesthesia Postprocedure Evaluation (Signed)
Anesthesia Post Note  Patient: Tammy Deleon  Procedure(s) Performed: AN AD [place]  Patient location during evaluation: Mother Baby Anesthesia Type: Epidural Level of consciousness: awake and alert Pain management: pain level controlled Vital Signs Assessment: post-procedure vital signs reviewed and stable Respiratory status: spontaneous breathing, nonlabored ventilation and respiratory function stable Cardiovascular status: stable Postop Assessment: no headache, no backache and epidural receding Anesthetic complications: no     Last Vitals:  Vitals:   09/22/19 0120 09/22/19 0753  BP: 126/81 111/68  Pulse: 80 71  Resp:  16  Temp: 36.9 C 37 C  SpO2: 99%     Last Pain:  Vitals:   09/22/19 0800  TempSrc:   PainSc: 0-No pain                 Synethia Endicott Lorenza Chick

## 2019-09-22 NOTE — Plan of Care (Signed)
Pt received from L&D after successful vaginal birth. Pt has husband present at bedside for support at this time. Patient denies pain currently and plans to bottle feed infant girl "Sadie". Pt informed to call before going to restroom for safety. All safety precautions in place at this time. Will continue to monitor and assess.

## 2019-09-22 NOTE — Progress Notes (Signed)
Pt expressed she would like to wait to have her BTL outpatient instead of this afternoon. Dr. Leafy Ro and OR notified to cancel surgery.

## 2019-09-22 NOTE — Lactation Note (Signed)
This note was copied from a baby's chart. Lactation Consultation Note  Patient Name: Tammy Deleon KCLEX'N Date: 09/22/2019 Reason for consult: Initial assessment  Mom indicated upon admission she desired breastfeeding and formula feeding for baby Sadie. LC checked in with mom due to all feedings thus far being formula. Baby had difficulty with Gerber formula and has now switched to Similac formula, tolerating well. When asked, mom reports only wanting to formula feed. No problems/concerns voiced.  Maternal Data Formula Feeding for Exclusion: Yes Reason for exclusion: Mother's choice to formula and breast feed on admission  Feeding Feeding Type: Bottle Fed - Formula Nipple Type: Regular  LATCH Score                   Interventions    Lactation Tools Discussed/Used     Consult Status Consult Status: Complete    [name] 09/22/2019, 11:41 AM

## 2019-09-23 LAB — CBC
HCT: 28.6 % — ABNORMAL LOW (ref 36.0–46.0)
Hemoglobin: 9.5 g/dL — ABNORMAL LOW (ref 12.0–15.0)
MCH: 26.7 pg (ref 26.0–34.0)
MCHC: 33.2 g/dL (ref 30.0–36.0)
MCV: 80.3 fL (ref 80.0–100.0)
Platelets: 307 10*3/uL (ref 150–400)
RBC: 3.56 MIL/uL — ABNORMAL LOW (ref 3.87–5.11)
RDW: 13.2 % (ref 11.5–15.5)
WBC: 14.6 10*3/uL — ABNORMAL HIGH (ref 4.0–10.5)
nRBC: 0 % (ref 0.0–0.2)

## 2019-09-23 NOTE — Discharge Instructions (Signed)
Please call your doctor or return to the ER if you experience any chest pains, shortness of breath, dizziness, visual changes, severe headache (unrelieved by pain meds), fever greater than 101, any heavy bleeding (saturating more than 1 pad per hour), large clots, or foul smelling discharge, any worsening abdominal pain and cramping that is not controlled by pain medication, any calf/leg pain or redness, any breast concerns (redness/pain), or any signs of postpartum depression. No tampons, enemas, douches, or sexual intercourse for 6 weeks. Also avoid tub baths, hot tubs, or swimming for 6 weeks.

## 2019-09-23 NOTE — Progress Notes (Signed)
Discharge order received from doctor. Reviewed discharge instructions with patient and answered all questions. Follow up appointment instructions given. Patient verbalized understanding. ID bands checked. Patient discharged home with infant via wheelchair by nursing/auxillary.   Hilbert Bible, RN

## 2019-10-03 ENCOUNTER — Inpatient Hospital Stay: Admit: 2019-10-03 | Payer: Self-pay

## 2019-10-19 ENCOUNTER — Other Ambulatory Visit: Payer: Self-pay | Admitting: Student

## 2019-10-19 DIAGNOSIS — M5417 Radiculopathy, lumbosacral region: Secondary | ICD-10-CM

## 2019-11-01 ENCOUNTER — Ambulatory Visit: Payer: Managed Care, Other (non HMO)

## 2019-12-02 ENCOUNTER — Other Ambulatory Visit: Payer: Self-pay | Admitting: Physical Medicine and Rehabilitation

## 2019-12-02 DIAGNOSIS — M5416 Radiculopathy, lumbar region: Secondary | ICD-10-CM

## 2019-12-15 ENCOUNTER — Ambulatory Visit
Admission: RE | Admit: 2019-12-15 | Discharge: 2019-12-15 | Disposition: A | Discharge status: Home / Self Care | Payer: Managed Care, Other (non HMO) | Source: Ambulatory Visit | Attending: Physical Medicine and Rehabilitation | Admitting: Physical Medicine and Rehabilitation

## 2019-12-15 ENCOUNTER — Other Ambulatory Visit: Payer: Self-pay

## 2019-12-15 DIAGNOSIS — M5416 Radiculopathy, lumbar region: Secondary | ICD-10-CM

## 2019-12-27 ENCOUNTER — Other Ambulatory Visit: Payer: Self-pay | Admitting: Neurosurgery

## 2019-12-29 ENCOUNTER — Other Ambulatory Visit: Admission: RE | Admit: 2019-12-29 | Payer: Managed Care, Other (non HMO) | Source: Ambulatory Visit

## 2019-12-29 ENCOUNTER — Other Ambulatory Visit: Payer: Self-pay

## 2019-12-29 ENCOUNTER — Encounter
Admission: RE | Admit: 2019-12-29 | Discharge: 2019-12-29 | Disposition: A | Discharge status: Home / Self Care | Payer: Managed Care, Other (non HMO) | Source: Ambulatory Visit | Attending: Neurosurgery | Admitting: Neurosurgery

## 2019-12-29 NOTE — Patient Instructions (Addendum)
Your Lab visit is scheduled on: Friday 12/30/19.  Enter through the [employer]. Your COVID test is scheduled on:  Friday 12/30/19 Drive up in front of [facility] and remain in your vehicle.  Your procedure is scheduled on: Monday 01/02/20 Report to Same Day Surgery-Enter through [facility], then take elevator on left to 2nd floor. To find out your arrival time, call [phone] 1:00-3:00 PM on Friday 12/30/19  Remember: Instructions that are not followed completely may result in serious medical risk, up to and including death, or upon the discretion of your surgeon and anesthesiologist your surgery may need to be rescheduled.   __x__ 1. Do not eat food (including mints, candies, chewing gum) after midnight the night before your procedure. You may drink clear liquids up to 2 hours before you are scheduled to arrive at the hospital for your procedure.  Do not drink anything within 2 hours of your scheduled arrival to the hospital.  Approved clear liquids:  --Water or Apple juice without pulp  --Clear carbohydrate beverage such as Gatorade or Powerade  --Black Coffee or Clear Tea (No milk, no creamers, do not add anything to the coffee or tea)   __x__ 2. No Alcohol or smoking for 24 hours before or after surgery.  __x__ 3. Notify your doctor if there is any change in your medical condition (cold, fever, infections).  __x__ 4. On the morning of surgery brush your teeth with toothpaste and water.  You may rinse your mouth with mouthwash if you wish.  Do not swallow any toothpaste or mouthwash.  Please read over the following fact sheets that you were given: [facility] Preparing for Surgery, MRSA Information, Spine Surgery Patient Guide   __x__ Use CHG Soap provided as directed on instruction sheet.  Do not wear jewelry, make-up, hairpins, clips or nail polish on the day of surgery. Do not wear lotions, powders, deodorant, or perfumes.  Do not shave below the  face/neck 48 hours prior to surgery.  Do not bring valuables to the hospital.  [facility] is not responsible for any belongings or valuables.   For patients admitted to the hospital, discharge time is determined by your treatment team. For patients discharged on the day of surgery, you will NOT be permitted to drive yourself home.  You must have a responsible adult with you for 24 hours after surgery.  __x__ Take these medicines on the morning of surgery with a SMALL SIP OF WATER:  1. Hydrocodone/Acetaminophen if needed for pain  __x__ Follow recommendations from Cardiologist, Pulmonologist or PCP if you are on any blood thinners such as Aspirin, Coumadin, Plavix, Eliquis, Effient, Pradaxa, and Pletal.  __x__ Do not take any Anti-inflammatories until after surgery such as Advil, Ibuprofen, Motrin, Aleve, Naproxen, Naprosyn, BC/Goodies powders or aspirin products. You may take Tylenol and/or Hydrocodone if needed.   __x__ Do not take any over the counter vitamins/supplements until after surgery.  RN reviewed instructions via telephone interview, patient expressed understanding and will receive printed copy on day of COVID/lab visit.   __________________________ 12/29/19 @ 9:40 am phone call

## 2019-12-30 ENCOUNTER — Other Ambulatory Visit
Admission: RE | Admit: 2019-12-30 | Discharge: 2019-12-30 | Disposition: A | Discharge status: Home / Self Care | Payer: Managed Care, Other (non HMO) | Source: Ambulatory Visit | Attending: Neurosurgery | Admitting: Neurosurgery

## 2019-12-30 DIAGNOSIS — Z20822 Contact with and (suspected) exposure to covid-19: Secondary | ICD-10-CM | POA: Diagnosis not present

## 2019-12-30 DIAGNOSIS — Z01812 Encounter for preprocedural laboratory examination: Secondary | ICD-10-CM | POA: Diagnosis present

## 2019-12-30 LAB — URINALYSIS, ROUTINE W REFLEX MICROSCOPIC
Bilirubin Urine: NEGATIVE
Glucose, UA: NEGATIVE mg/dL
Hgb urine dipstick: NEGATIVE
Ketones, ur: 5 mg/dL — AB
Nitrite: NEGATIVE
Protein, ur: NEGATIVE mg/dL
Specific Gravity, Urine: 1.023 (ref 1.005–1.030)
pH: 6 (ref 5.0–8.0)

## 2019-12-30 LAB — CBC
HCT: 37 % (ref 36.0–46.0)
Hemoglobin: 12.2 g/dL (ref 12.0–15.0)
MCH: 27.7 pg (ref 26.0–34.0)
MCHC: 33 g/dL (ref 30.0–36.0)
MCV: 84.1 fL (ref 80.0–100.0)
Platelets: 348 10*3/uL (ref 150–400)
RBC: 4.4 MIL/uL (ref 3.87–5.11)
RDW: 14.5 % (ref 11.5–15.5)
WBC: 7.3 10*3/uL (ref 4.0–10.5)
nRBC: 0 % (ref 0.0–0.2)

## 2019-12-30 LAB — BASIC METABOLIC PANEL
Anion gap: 8 (ref 5–15)
BUN: 10 mg/dL (ref 6–20)
CO2: 24 mmol/L (ref 22–32)
Calcium: 8.9 mg/dL (ref 8.9–10.3)
Chloride: 107 mmol/L (ref 98–111)
Creatinine, Ser: 0.7 mg/dL (ref 0.44–1.00)
GFR calc Af Amer: >60 mL/min (ref >60)
GFR calc non Af Amer: >60 mL/min (ref >60)
Glucose, Bld: 79 mg/dL (ref 70–99)
Potassium: 3.3 mmol/L — ABNORMAL LOW (ref 3.5–5.1)
Sodium: 139 mmol/L (ref 135–145)

## 2019-12-30 LAB — PROTIME-INR
INR: 1 (ref 0.8–1.2)
Prothrombin Time: 12.6 seconds (ref 11.4–15.2)

## 2019-12-30 LAB — TYPE AND SCREEN
ABO/RH(D): O POS
Antibody Screen: NEGATIVE
Extend sample reason: UNDETERMINED

## 2019-12-30 LAB — SARS CORONAVIRUS 2 (TAT 6-24 HRS): SARS Coronavirus 2: NEGATIVE

## 2019-12-30 LAB — SURGICAL PCR SCREEN
MRSA, PCR: NEGATIVE
Staphylococcus aureus: NEGATIVE

## 2019-12-30 LAB — APTT: aPTT: 30 seconds (ref 24–36)

## 2020-01-02 ENCOUNTER — Encounter: Payer: Self-pay | Admitting: Neurosurgery

## 2020-01-02 ENCOUNTER — Ambulatory Visit: Payer: Managed Care, Other (non HMO) | Admitting: Anesthesiology

## 2020-01-02 ENCOUNTER — Encounter
Admission: RE | Disposition: A | Discharge status: Home / Self Care | Payer: Self-pay | Source: Home / Self Care | Attending: Neurosurgery

## 2020-01-02 ENCOUNTER — Ambulatory Visit: Payer: Managed Care, Other (non HMO)

## 2020-01-02 ENCOUNTER — Other Ambulatory Visit: Payer: Self-pay

## 2020-01-02 ENCOUNTER — Ambulatory Visit
Admission: RE | Admit: 2020-01-02 | Discharge: 2020-01-02 | Disposition: A | Discharge status: Home / Self Care | Payer: Managed Care, Other (non HMO) | Attending: Neurosurgery | Admitting: Neurosurgery

## 2020-01-02 DIAGNOSIS — M5416 Radiculopathy, lumbar region: Secondary | ICD-10-CM | POA: Insufficient documentation

## 2020-01-02 DIAGNOSIS — Z419 Encounter for procedure for purposes other than remedying health state, unspecified: Secondary | ICD-10-CM

## 2020-01-02 DIAGNOSIS — M4807 Spinal stenosis, lumbosacral region: Secondary | ICD-10-CM | POA: Insufficient documentation

## 2020-01-02 DIAGNOSIS — M48061 Spinal stenosis, lumbar region without neurogenic claudication: Secondary | ICD-10-CM | POA: Diagnosis present

## 2020-01-02 DIAGNOSIS — M5127 Other intervertebral disc displacement, lumbosacral region: Secondary | ICD-10-CM | POA: Insufficient documentation

## 2020-01-02 DIAGNOSIS — Z882 Allergy status to sulfonamides status: Secondary | ICD-10-CM | POA: Diagnosis not present

## 2020-01-02 HISTORY — PX: HEMI-MICRODISCECTOMY LUMBAR LAMINECTOMY LEVEL 1: SHX5846

## 2020-01-02 LAB — POCT PREGNANCY, URINE: Preg Test, Ur: NEGATIVE

## 2020-01-02 SURGERY — HEMI-MICRODISCECTOMY LUMBAR LAMINECTOMY LEVEL 1
Anesthesia: General | Laterality: Right

## 2020-01-02 MED ORDER — FAMOTIDINE 20 MG PO TABS: 20.0000 mg | ORAL_TABLET | Freq: Once | ORAL | Status: AC

## 2020-01-02 MED ORDER — ACETAMINOPHEN 10 MG/ML IV SOLN
INTRAVENOUS | Status: AC
  Filled 2020-01-02: qty 100

## 2020-01-02 MED ORDER — MIDAZOLAM HCL 2 MG/2ML IJ SOLN
INTRAMUSCULAR | Status: AC
  Filled 2020-01-02: qty 2

## 2020-01-02 MED ORDER — FENTANYL CITRATE (PF) 100 MCG/2ML IJ SOLN
INTRAMUSCULAR | Status: AC
  Administered 2020-01-02: 25 ug via INTRAVENOUS
  Filled 2020-01-02: qty 2

## 2020-01-02 MED ORDER — METHOCARBAMOL 500 MG PO TABS: 500.0000 mg | ORAL_TABLET | Freq: Four times a day (QID) | ORAL | 0 refills | Status: DC | PRN

## 2020-01-02 MED ORDER — ONDANSETRON HCL 4 MG/2ML IJ SOLN
INTRAMUSCULAR | Status: DC | PRN
  Administered 2020-01-02: 4 mg via INTRAVENOUS

## 2020-01-02 MED ORDER — METHOCARBAMOL 500 MG PO TABS
500.0000 mg | ORAL_TABLET | Freq: Once | ORAL | Status: AC
  Administered 2020-01-02: 500 mg via ORAL
  Filled 2020-01-02: qty 1

## 2020-01-02 MED ORDER — FENTANYL CITRATE (PF) 100 MCG/2ML IJ SOLN
INTRAMUSCULAR | Status: AC
  Filled 2020-01-02: qty 2

## 2020-01-02 MED ORDER — CEFAZOLIN SODIUM-DEXTROSE 2-4 GM/100ML-% IV SOLN
2.0000 g | INTRAVENOUS | Status: AC
  Administered 2020-01-02: 2 g via INTRAVENOUS

## 2020-01-02 MED ORDER — REMIFENTANIL HCL 1 MG IV SOLR
INTRAVENOUS | Status: AC
  Filled 2020-01-02: qty 1000

## 2020-01-02 MED ORDER — LIDOCAINE HCL (CARDIAC) PF 100 MG/5ML IV SOSY
PREFILLED_SYRINGE | INTRAVENOUS | Status: DC | PRN
  Administered 2020-01-02: 100 mg via INTRAVENOUS

## 2020-01-02 MED ORDER — PHENYLEPHRINE HCL (PRESSORS) 10 MG/ML IV SOLN
INTRAVENOUS | Status: DC | PRN
  Administered 2020-01-02 (×3): 100 ug via INTRAVENOUS

## 2020-01-02 MED ORDER — PROPOFOL 500 MG/50ML IV EMUL
INTRAVENOUS | Status: AC
  Filled 2020-01-02: qty 50

## 2020-01-02 MED ORDER — PROPOFOL 10 MG/ML IV BOLUS
INTRAVENOUS | Status: DC | PRN
  Administered 2020-01-02: 160 mg via INTRAVENOUS

## 2020-01-02 MED ORDER — OXYCODONE HCL 5 MG PO TABS: 5.0000 mg | ORAL_TABLET | ORAL | 0 refills | Status: DC | PRN

## 2020-01-02 MED ORDER — PROPOFOL 500 MG/50ML IV EMUL
INTRAVENOUS | Status: DC | PRN
  Administered 2020-01-02: 80 ug/kg/min via INTRAVENOUS

## 2020-01-02 MED ORDER — DEXAMETHASONE SODIUM PHOSPHATE 10 MG/ML IJ SOLN
INTRAMUSCULAR | Status: DC | PRN
  Administered 2020-01-02: 10 mg via INTRAVENOUS

## 2020-01-02 MED ORDER — MIDAZOLAM HCL 2 MG/2ML IJ SOLN
INTRAMUSCULAR | Status: DC | PRN
  Administered 2020-01-02: 2 mg via INTRAVENOUS

## 2020-01-02 MED ORDER — ROCURONIUM BROMIDE 100 MG/10ML IV SOLN
INTRAVENOUS | Status: DC | PRN
  Administered 2020-01-02: 20 mg via INTRAVENOUS
  Administered 2020-01-02: 10 mg via INTRAVENOUS

## 2020-01-02 MED ORDER — KETOROLAC TROMETHAMINE 30 MG/ML IJ SOLN
INTRAMUSCULAR | Status: DC | PRN
  Administered 2020-01-02: 30 mg via INTRAVENOUS

## 2020-01-02 MED ORDER — SUCCINYLCHOLINE CHLORIDE 20 MG/ML IJ SOLN
INTRAMUSCULAR | Status: DC | PRN
  Administered 2020-01-02: 100 mg via INTRAVENOUS

## 2020-01-02 MED ORDER — OXYCODONE HCL 5 MG PO TABS
5.0000 mg | ORAL_TABLET | Freq: Once | ORAL | Status: AC
  Administered 2020-01-02: 5 mg via ORAL
  Filled 2020-01-02: qty 1

## 2020-01-02 MED ORDER — CEFAZOLIN SODIUM-DEXTROSE 2-4 GM/100ML-% IV SOLN
INTRAVENOUS | Status: AC
  Filled 2020-01-02: qty 100

## 2020-01-02 MED ORDER — FENTANYL CITRATE (PF) 100 MCG/2ML IJ SOLN
25.0000 ug | INTRAMUSCULAR | Status: DC | PRN
  Administered 2020-01-02 (×5): 25 ug via INTRAVENOUS

## 2020-01-02 MED ORDER — ACETAMINOPHEN 10 MG/ML IV SOLN
INTRAVENOUS | Status: DC | PRN
  Administered 2020-01-02: 1000 mg via INTRAVENOUS

## 2020-01-02 MED ORDER — FAMOTIDINE 20 MG PO TABS
ORAL_TABLET | ORAL | Status: AC
  Administered 2020-01-02: 20 mg via ORAL
  Filled 2020-01-02: qty 1

## 2020-01-02 MED ORDER — OXYCODONE HCL 5 MG PO TABS
ORAL_TABLET | ORAL | Status: AC
  Filled 2020-01-02: qty 1

## 2020-01-02 MED ORDER — METHYLPREDNISOLONE ACETATE 40 MG/ML IJ SUSP
INTRAMUSCULAR | Status: DC | PRN
  Administered 2020-01-02: 40 mg

## 2020-01-02 MED ORDER — THROMBIN 5000 UNITS EX SOLR
CUTANEOUS | Status: DC | PRN
  Administered 2020-01-02: 5000 [IU] via TOPICAL

## 2020-01-02 MED ORDER — LIDOCAINE-EPINEPHRINE (PF) 1 %-1:200000 IJ SOLN
INTRAMUSCULAR | Status: DC | PRN
  Administered 2020-01-02: 10 mL

## 2020-01-02 MED ORDER — ONDANSETRON HCL 4 MG/2ML IJ SOLN: 4.0000 mg | Freq: Once | INTRAMUSCULAR | Status: DC | PRN

## 2020-01-02 MED ORDER — SUGAMMADEX SODIUM 200 MG/2ML IV SOLN
INTRAVENOUS | Status: DC | PRN
  Administered 2020-01-02: 138.8 mg via INTRAVENOUS

## 2020-01-02 MED ORDER — LACTATED RINGERS IV SOLN: INTRAVENOUS | Status: DC

## 2020-01-02 MED ORDER — FENTANYL CITRATE (PF) 100 MCG/2ML IJ SOLN
INTRAMUSCULAR | Status: DC | PRN
  Administered 2020-01-02: 100 ug via INTRAVENOUS
  Administered 2020-01-02 (×2): 50 ug via INTRAVENOUS

## 2020-01-02 SURGICAL SUPPLY — 58 items
BUR NEURO DRILL SOFT 3.0X3.8M (BURR) ×2 IMPLANT
CANISTER SUCT 1200ML W/VALVE (MISCELLANEOUS) ×4 IMPLANT
CHLORAPREP W/TINT 26 (MISCELLANEOUS) ×4 IMPLANT
COUNTER NEEDLE 20/40 LG (NEEDLE) ×2 IMPLANT
COVER LIGHT HANDLE STERIS (MISCELLANEOUS) ×4 IMPLANT
COVER WAND RF STERILE (DRAPES) ×2 IMPLANT
CUP MEDICINE 2OZ PLAST GRAD ST (MISCELLANEOUS) ×2 IMPLANT
DERMABOND ADVANCED (GAUZE/BANDAGES/DRESSINGS) ×1
DERMABOND ADVANCED .7 DNX12 (GAUZE/BANDAGES/DRESSINGS) ×1 IMPLANT
DRAPE C-ARM 42X72 X-RAY (DRAPES) ×4 IMPLANT
DRAPE LAPAROTOMY 100X77 ABD (DRAPES) ×2 IMPLANT
DRAPE MICROSCOPE SPINE 48X150 (DRAPES) IMPLANT
DRAPE SURG 17X11 SM STRL (DRAPES) ×2 IMPLANT
DRSG TEGADERM 4X4.75 (GAUZE/BANDAGES/DRESSINGS) IMPLANT
DRSG TELFA 4X3 1S NADH ST (GAUZE/BANDAGES/DRESSINGS) IMPLANT
DURASEAL APPLICATOR TIP (TIP) IMPLANT
DURASEAL SPINE SEALANT 3ML (MISCELLANEOUS) IMPLANT
ELECT CAUTERY BLADE TIP 2.5 (TIP) ×2
ELECT EZSTD 165MM 6.5IN (MISCELLANEOUS) ×2
ELECT REM PT RETURN 9FT ADLT (ELECTROSURGICAL) ×2
ELECTRODE CAUTERY BLDE TIP 2.5 (TIP) ×1 IMPLANT
ELECTRODE EZSTD 165MM 6.5IN (MISCELLANEOUS) ×1 IMPLANT
ELECTRODE REM PT RTRN 9FT ADLT (ELECTROSURGICAL) ×1 IMPLANT
GAUZE SPONGE 4X4 12PLY STRL (GAUZE/BANDAGES/DRESSINGS) ×2 IMPLANT
GLOVE BIOGEL PI IND STRL 7.0 (GLOVE) ×1 IMPLANT
GLOVE BIOGEL PI INDICATOR 7.0 (GLOVE) ×1
GLOVE INDICATOR 8.0 STRL GRN (GLOVE) ×6 IMPLANT
GLOVE SURG SYN 7.0 (GLOVE) ×4 IMPLANT
GLOVE SURG SYN 8.0 (GLOVE) ×2 IMPLANT
GOWN STRL REUS W/ TWL XL LVL3 (GOWN DISPOSABLE) ×1 IMPLANT
GOWN STRL REUS W/TWL MED LVL3 (GOWN DISPOSABLE) ×2 IMPLANT
GOWN STRL REUS W/TWL XL LVL3 (GOWN DISPOSABLE) ×1
GRADUATE 1200CC STRL 31836 (MISCELLANEOUS) ×2 IMPLANT
KIT TURNOVER KIT A (KITS) ×2 IMPLANT
KIT WILSON FRAME (KITS) ×2 IMPLANT
KNIFE BAYONET SHORT DISCETOMY (MISCELLANEOUS) ×2 IMPLANT
MARKER SKIN DUAL TIP RULER LAB (MISCELLANEOUS) ×4 IMPLANT
NDL SAFETY ECLIPSE 18X1.5 (NEEDLE) ×1 IMPLANT
NEEDLE HYPO 18GX1.5 SHARP (NEEDLE) ×1
NEEDLE HYPO 22GX1.5 SAFETY (NEEDLE) ×2 IMPLANT
NS IRRIG 1000ML POUR BTL (IV SOLUTION) ×2 IMPLANT
PACK LAMINECTOMY NEURO (CUSTOM PROCEDURE TRAY) ×2 IMPLANT
PAD ARMBOARD 7.5X6 YLW CONV (MISCELLANEOUS) ×2 IMPLANT
SPOGE SURGIFLO 8M (HEMOSTASIS) ×1
SPONGE SURGIFLO 8M (HEMOSTASIS) ×1 IMPLANT
STAPLER SKIN PROX 35W (STAPLE) IMPLANT
SUT NURALON 4 0 TR CR/8 (SUTURE) IMPLANT
SUT POLYSORB 2-0 5X18 GS-10 (SUTURE) ×6 IMPLANT
SUT VIC AB 0 CT1 18XCR BRD 8 (SUTURE) ×1 IMPLANT
SUT VIC AB 0 CT1 8-18 (SUTURE) ×1
SUT VIC AB 3-0 SH 8-18 (SUTURE) ×2 IMPLANT
SYR 10ML LL (SYRINGE) ×4 IMPLANT
SYR 30ML LL (SYRINGE) ×2 IMPLANT
SYR 3ML LL SCALE MARK (SYRINGE) ×2 IMPLANT
TOWEL OR 17X26 4PK STRL BLUE (TOWEL DISPOSABLE) ×8 IMPLANT
TUBE MATRX SPINL 18MM 6CM DISP (INSTRUMENTS) ×1
TUBE METRX SPINAL 18X6 DISP (INSTRUMENTS) ×1 IMPLANT
TUBING CONNECTING 10 (TUBING) ×2 IMPLANT

## 2020-01-02 NOTE — Discharge Instructions (Addendum)
Your surgeon has performed an operation on your lumbar spine (low back) to relieve pressure on one or more nerves. Many times, patients feel better immediately after surgery and can "overdo it." Even if you feel well, it is important that you follow these activity guidelines. If you do not let your back heal properly from the surgery, you can increase the chance of a disc herniation and/or return of your symptoms. The following are instructions to help in your recovery once you have been discharged from the hospital.  * Do not take anti-inflammatory medications for 3 days after surgery (naproxen [Aleve], ibuprofen [Advil, Motrin], celecoxib [Celebrex], etc.)  Activity    No bending, lifting, or twisting ("BLT"). Avoid lifting objects heavier than 10 pounds (gallon milk jug).  Where possible, avoid household activities that involve lifting, bending, pushing, or pulling such as laundry, vacuuming, grocery shopping, and childcare. Try to arrange for help from friends and family for these activities while your back heals.  Increase physical activity slowly as tolerated.  Taking short walks is encouraged, but avoid strenuous exercise. Do not jog, run, bicycle, lift weights, or participate in any other exercises unless specifically allowed by your doctor. Avoid prolonged sitting, including car rides.  Talk to your doctor before resuming sexual activity.  You should not drive until cleared by your doctor.  Until released by your doctor, you should not return to work or school.  You should rest at home and let your body heal.   You may shower two days after your surgery.  After showering, lightly dab your incision dry. Do not take a tub bath or go swimming for 3 weeks, or until approved by your doctor at your follow-up appointment.  If you smoke, we strongly recommend that you quit.  Smoking has been proven to interfere with normal healing in your back and will dramatically reduce the success rate of  your surgery. Please contact [facility] (800-QUIT-NOW) and use the resources at www.[facility].com for assistance in stopping smoking.  Surgical Incision   If you have a dressing on your incision, you may remove it three days after your surgery. Keep your incision area clean and dry.  If you have staples or stitches on your incision, you should have a follow up scheduled for removal. If you do not have staples or stitches, you will have steri-strips (small pieces of surgical tape) or Dermabond glue. The steri-strips/glue should begin to peel away within about a week (it is fine if the steri-strips fall off before then). If the strips are still in place one week after your surgery, you may gently remove them.  Diet            You may return to your usual diet. Be sure to stay hydrated.  When to Contact us  Although your surgery and recovery will likely be uneventful, you may have some residual numbness, aches, and pains in your back and/or legs. This is normal and should improve in the next few weeks.  However, should you experience any of the following, contact us immediately: . New numbness or weakness . Pain that is progressively getting worse, and is not relieved by your pain medications or rest . Bleeding, redness, swelling, pain, or drainage from surgical incision . Chills or flu-like symptoms . Fever greater than 101.0 F (38.3 C) . Problems with bowel or bladder functions . Difficulty breathing or shortness of breath . Warmth, tenderness, or swelling in your calf  Contact Information . During office hours (Monday-Friday  9 am to 5 pm), please call your physician at [phone] . After hours and weekends, please call [phone] and an answering service will put you in touch with either Dr. Lacinda Axon or Dr. Izora Ribas.  . For a life-threatening emergency, call [place]   1) The drugs that you were given will stay in your system until  tomorrow so for the next 24 hours you should not:  A) Drive an automobile B) Make any legal decisions C) Drink any alcoholic beverage   2) You may resume regular meals tomorrow.  Today it is better to start with liquids and gradually work up to solid foods.  You may eat anything you prefer, but it is better to start with liquids, then soup and crackers, and gradually work up to solid foods.   3) Please notify your doctor immediately if you have any unusual bleeding, trouble breathing, redness and pain at the surgery site, drainage, fever, or pain not relieved by medication.    4) Additional Instructions:        Please contact your physician with any problems or Same Day Surgery at [phone], Monday through Friday 6 am to 4 pm, or [place] at [facility] number at [phone].

## 2020-01-02 NOTE — Op Note (Signed)
Operative Note  SURGERY DATE:01/02/2020  PRE-OP DIAGNOSIS: Lumbar Stenosis withLumbar Radiculopathy(m48.062)  POST-OP DIAGNOSIS:Post-Op Diagnosis Codes: Lumbar Stenosis withLumbar Radiculopathy(m48.062)  Procedure(s) with comments: Right L5/S1Hemilaminectomy andDiscectomy  SURGEON:  * Malen Gauze, MD Marin Olp, PA Assistant  ANESTHESIA:General  OPERATIVE FINDINGS: Lateral recess stenosis at L5/S1on the right with herniated disc  OPERATIVE REPORT:   Indication: Ms.Phillipspresented to clinic on2/23with ongoingright legpain.Shehad failed conservative managementof medications, injections, and therapyand the symptoms were affecting herlifestyle. MRI revealedrightL5/S1stenosis compressingthetraversingnerve root with a herniated disc.We discussed decompression at that level.Therisks of surgery were explained to include hematoma, infection, damage to nerve roots, CSF leak, weakness, numbness, pain, need for future surgery including fusion, heart attack, and stroke.Sheelected to proceed with surgery for symptom relief.  Procedure The patient was brought to the OR after informed consent was obtained.She was given general anesthesia and intubated by the anesthesia service. Vascular access lines were placed.The patient was then placed prone on a Wilson frameensuring all pressure points were padded.Antibiotics were administered.A time-out was performed per protocol.   The patient was sterilely prepped and draped. Fluoroscopy confirmedL5/S1interspaceandanincision was planned1.5cm off midlineon the right.The incision was instilled withlocal anesthetic with epinephrine. The skin was opened sharply and the dissection taken to the fascia. This was incised and initial dilator placedthe spinous processes and lamina of L5on therightout to the medial edge of the facet. Serial dilatorswere inserted via fluoroscopy and the  final33m tube was placed at depth of6cm.  The microscope was brought into the field. The overlying muscle was removed from lamina and medial facet.Next, a matchstickdrill bit was used to remove theL5lamina centrallyand going laterally.The underlying ligament was freed and removed with combination of rongeurs. There was a large disc protrusion seen under the S1 nerve root compressing it medially.  The dura was seen to be full and intact.The dura ane nerve were retracted and the disc space entered sharply. There was soft disc material removed. The space was probed with curettes and rongeurs until a ball tip and further disc material removed. The nerve root was decompressed at this point. The epidural space was inspected and no residual disc seen. The area was irrigated profusely to remove other small fragments.Hemostasis was obtained and wound irrigated. Depomedrol was placed along thecal sac and nerve root.The working channel was removed.  The microscope was removed.Thefasciawas then closed using  2-0vicryl followed by thesubcutaneous and dermal layers with 2-0 vicryluntil the epidermis was well approximated. The skin was closed withDermabond..  The patient was returned to supine position and extubated by the anesthesia service. The patient was then taken to the PACU for post-operative care whereshe was moving extremities symmetrically.   ESTIMATED BLOOD LOSS: 20cc  SPECIMENS None  IMPLANT None   I performed the case in its entiretywith assistance of PA, ACorrie Mckusick MSeward

## 2020-01-02 NOTE — Interval H&P Note (Signed)
History and Physical Interval Note:  01/02/2020 6:40 AM  Tammy Deleon  has presented today for surgery, with the diagnosis of lumbar radiculopathy m54.16.  The various methods of treatment have been discussed with the patient and family. After consideration of risks, benefits and other options for treatment, the patient has consented to  Procedure(s): RIGHT L5/S1 HEMILAMINECTOMY & DISCECTOMY (Right) as a surgical intervention.  The patient's history has been reviewed, patient examined, no change in status, stable for surgery.  I have reviewed the patient's chart and labs.  Questions were answered to the patient's satisfaction.     [name]

## 2020-01-02 NOTE — H&P (Signed)
Tammy Deleon is an 34 y.o. female.   Chief Complaint: Right leg pain HPI: Tammy Deleon is here for evaluation for right leg pain that started in December and she was seen by Tammy Deleon. She states that the right leg pain goes down the lateral side of the leg in the calf and into the lateral side of the foot. She has started to note some numbness that is occurring along the same distribution. She did recently go for an epidural steroid injection and got no relief and states that the pain actually worsened the day after the injection. She has been doing physical therapy and no relief noted with those maneuvers. She has taken ibuprofen and hydrocodone without significant relief. She has been very restricting her activity due to the pain and numbness. She denies any left leg symptoms. She had a MRI that showed a disc herniation at L5/S1 and after discussion, wants to proceed with surgical decmpression  Past Medical History:  Diagnosis Date  . Anxiety   . Depression   . Irregular intermenstrual bleeding   . UTI (lower urinary tract infection)     Past Surgical History:  Procedure Laterality Date  . GALLBLADDER SURGERY      History reviewed. No pertinent family history. Social History:  reports that she has never smoked. She has never used smokeless tobacco. She reports current alcohol use. She reports that she does not use drugs.  Allergies:  Allergies  Allergen Reactions  . Sulfa Antibiotics     Medications Prior to Admission  Medication Sig Dispense Refill  . HYDROcodone-acetaminophen (NORCO/VICODIN) 5-325 MG tablet Take 1 tablet by mouth 3 (three) times daily as needed for pain.    Tammy Deleon ibuprofen (ADVIL) 800 MG tablet Take 800 mg by mouth every 8 (eight) hours as needed for moderate pain.      Results for orders placed or performed during the hospital encounter of 01/02/20 (from the past 48 hour(s))  Pregnancy, urine POC     Status: None   Collection Time: 01/02/20  6:29 AM   Result Value Ref Range   Preg Test, Ur NEGATIVE NEGATIVE    Comment:        THE SENSITIVITY OF THIS METHODOLOGY IS >24 mIU/mL    No results found.  Review of Systems General ROS: Negative Psychological ROS: Negative Ophthalmic ROS: Negative ENT ROS: Negative Hematological and Lymphatic ROS: Negative  Endocrine ROS: Negative Respiratory ROS: Negative Cardiovascular ROS: Negative Gastrointestinal ROS: Negative Genito-Urinary ROS: Negative Musculoskeletal ROS: Negative for back pain Neurological ROS: Positive for right leg pain and numbness Dermatological ROS: Negative  Blood pressure (!) 134/92, pulse 64, temperature (!) 97.1 F (36.2 C), temperature source Tympanic, resp. rate 16, SpO2 100 %, unknown if currently breastfeeding. Physical Exam  General appearance: Alert, cooperative, in no acute distress Head: Normocephalic, atraumatic Eyes: Normal, EOM intact Oropharynx: Wearing facemask CV: Regular rate Pulm: Clear to auscultation Back: No tenderness to palpation of the midline or paramedian regions Ext: No edema in LE bilaterally  Neurologic exam:  Mental status: alertness: alert, affect: normal Speech: fluent and clear Motor:strength symmetric 5/5 in bilateral hip flexion, knee flexion, knee sense, dorsiflexion, plantarflexion Sensory: intact to light touch in bilateral lower extremities Reflexes: 2+ and symmetric bilaterally for patella Gait: normal    Assessment/Plan We will continue with plan for a right L5-S1 hemilaminectomy    Tammy Perla, MD 01/02/2020, 6:38 AM

## 2020-01-02 NOTE — Transfer of Care (Signed)
Immediate Anesthesia Transfer of Care Note  Patient: Tammy Deleon  Procedure(s) Performed: RIGHT L5/S1 HEMILAMINECTOMY & DISCECTOMY (Right )  Patient Location: PACU  Anesthesia Type:General  Level of Consciousness: awake and sedated  Airway & Oxygen Therapy: Patient Spontanous Breathing and Patient connected to face mask oxygen  Post-op Assessment: Report given to RN and Post -op Vital signs reviewed and stable  Post vital signs: Reviewed and stable  Last Vitals:  Vitals Value Taken Time  BP 106/89 01/02/20 0926  Temp 36 C 01/02/20 0915  Pulse 100 01/02/20 0927  Resp 15 01/02/20 0927  SpO2 100 % 01/02/20 0927  Vitals shown include unvalidated device data.  Last Pain:  Vitals:   01/02/20 0921  TempSrc:   PainSc: 8          Complications: No apparent anesthesia complications

## 2020-01-02 NOTE — Anesthesia Procedure Notes (Signed)
Procedure Name: Intubation Date/Time: 01/02/2020 7:20 AM Performed by: Nelda Marseille, CRNA Pre-anesthesia Checklist: Patient identified, Patient being monitored, Timeout performed, Emergency Drugs available and Suction available Patient Re-evaluated:Patient Re-evaluated prior to induction Oxygen Delivery Method: Circle system utilized Preoxygenation: Pre-oxygenation with 100% oxygen Induction Type: IV induction Ventilation: Mask ventilation without difficulty Laryngoscope Size: Mac, 3 and McGraph Grade View: Grade I Tube type: Oral Tube size: 7.0 mm Number of attempts: 1 Airway Equipment and Method: Stylet Placement Confirmation: ETT inserted through vocal cords under direct vision,  positive ETCO2 and breath sounds checked- equal and bilateral Secured at: 21 cm Tube secured with: Tape Dental Injury: Teeth and Oropharynx as per pre-operative assessment

## 2020-01-02 NOTE — Anesthesia Postprocedure Evaluation (Signed)
Anesthesia Post Note  Patient: AMIL MOSEMAN  Procedure(s) Performed: RIGHT L5/S1 HEMILAMINECTOMY & DISCECTOMY (Right )  Patient location during evaluation: PACU Anesthesia Type: General Level of consciousness: awake and alert and oriented Pain management: pain level controlled Vital Signs Assessment: post-procedure vital signs reviewed and stable Respiratory status: spontaneous breathing, nonlabored ventilation and respiratory function stable Cardiovascular status: blood pressure returned to baseline and stable Postop Assessment: no signs of nausea or vomiting Anesthetic complications: no     Last Vitals:  Vitals:   01/02/20 0959 01/02/20 1015  BP: 128/84 126/77  Pulse: 83 77  Resp: 17 18  Temp:  36.7 C  SpO2: 100% 99%    Last Pain:  Vitals:   01/02/20 1015  TempSrc: Tympanic  PainSc: 5                  Adelisa Satterwhite

## 2020-01-02 NOTE — OR Nursing (Signed)
DR. Lacinda Axon at [facility] for evaluation of hematoma. He advised pt. fine for discharge and ice pack applied to surgical site. Pt. Has no neuro deficits.

## 2020-01-02 NOTE — Discharge Summary (Signed)
Procedure: Right L5/S1 hemilaminectomy and discectomy Procedure date: 01/02/2020 Diagnosis: lumbar radiculopathy   History: Tammy Deleon is s/p right L5/S1 microdiscectomy for lumbar radiculopathy  POD0: Tolerated procedure well. Evaluated in post op recovery still disoriented from anesthesia but able to answer questions and obey commands.  Right lower extremity pain that was present prior to surgery has resolved.  Complains of right-sided back pain currently rated 6/10.  Denies any new pain/numbness/tingling/weakness in lower extremities.  Physical Exam: Vitals:   01/02/20 0634  BP: (!) 134/92  Pulse: 64  Resp: 16  Temp: (!) 97.1 F (36.2 C)  SpO2: 100%   Strength:5/5 throughout lower extremities bilaterally Sensation: intact and symmetric throughout lower extremities bilaterally Skin: Glue intact at incision site  Data:  Recent Labs  Lab 12/30/19 1035  NA 139  K 3.3*  CL 107  CO2 24  BUN 10  CREATININE 0.70  GLUCOSE 79  CALCIUM 8.9   No results for input(s): AST, ALT, ALKPHOS in the last 168 hours.  Invalid input(s): TBILI   Recent Labs  Lab 12/30/19 1035  WBC 7.3  HGB 12.2  HCT 37.0  PLT 348   Recent Labs  Lab 12/30/19 1035  APTT 30  INR 1.0         Other tests/results: No imaging reviewed   Assessment/Plan:  SHUNTAVIA YERBY is POD0 s/p right L5/S1 microdiscectomy for lumbar radiculopathy.  Pain that was present prior to surgery has resolved.  Will continue post op pain control with tylenol, muscle relaxer, and pain medication as needed. She is scheduled to follow up in approximately 2 weeks to monitor progress.  Advised to contact office if any questions or concerns arise before then.  Marin Olp PA-C Department of Neurosurgery

## 2020-01-02 NOTE — Anesthesia Preprocedure Evaluation (Signed)
Anesthesia Evaluation  Patient identified by MRN, date of birth, ID band Patient awake    Reviewed: Allergy & Precautions, NPO status , Patient's Chart, lab work & pertinent test results  History of Anesthesia Complications Negative for: history of anesthetic complications  Airway Mallampati: II       Dental   Pulmonary neg sleep apnea, neg COPD, Not current smoker,           Cardiovascular hypertension (gestational), (-) Past MI and (-) CHF (-) dysrhythmias (-) Valvular Problems/Murmurs     Neuro/Psych neg Seizures Anxiety Depression    GI/Hepatic Neg liver ROS, neg GERD  ,  Endo/Other  neg diabetes  Renal/GU negative Renal ROS     Musculoskeletal   Abdominal   Peds  Hematology   Anesthesia Other Findings   Reproductive/Obstetrics                             Anesthesia Physical Anesthesia Plan  ASA: II  Anesthesia Plan: General   Post-op Pain Management:    Induction: Intravenous  PONV Risk Score and Plan: 3 and Ondansetron, Dexamethasone and Midazolam  Airway Management Planned: Oral ETT  Additional Equipment:   Intra-op Plan:   Post-operative Plan:   Informed Consent: I have reviewed the patients History and Physical, chart, labs and discussed the procedure including the risks, benefits and alternatives for the proposed anesthesia with the patient or authorized representative who has indicated his/her understanding and acceptance.       Plan Discussed with:   Anesthesia Plan Comments:         Anesthesia Quick Evaluation

## 2020-06-14 ENCOUNTER — Other Ambulatory Visit: Payer: Self-pay | Admitting: Family Medicine

## 2020-08-08 ENCOUNTER — Other Ambulatory Visit: Payer: Self-pay | Admitting: Internal Medicine

## 2020-08-21 ENCOUNTER — Other Ambulatory Visit: Payer: Self-pay

## 2020-08-22 ENCOUNTER — Other Ambulatory Visit: Payer: Self-pay | Admitting: Obstetrics and Gynecology

## 2020-08-22 DIAGNOSIS — E663 Overweight: Secondary | ICD-10-CM | POA: Diagnosis not present

## 2020-09-04 DIAGNOSIS — M5441 Lumbago with sciatica, right side: Secondary | ICD-10-CM | POA: Diagnosis not present

## 2020-09-04 DIAGNOSIS — M5416 Radiculopathy, lumbar region: Secondary | ICD-10-CM | POA: Diagnosis not present

## 2020-09-07 ENCOUNTER — Other Ambulatory Visit (HOSPITAL_COMMUNITY): Payer: Self-pay | Admitting: Neurosurgery

## 2020-09-07 ENCOUNTER — Other Ambulatory Visit: Payer: Self-pay | Admitting: Neurosurgery

## 2020-09-07 DIAGNOSIS — M5416 Radiculopathy, lumbar region: Secondary | ICD-10-CM

## 2020-09-07 DIAGNOSIS — M5441 Lumbago with sciatica, right side: Secondary | ICD-10-CM

## 2020-09-17 ENCOUNTER — Other Ambulatory Visit: Payer: Self-pay | Admitting: Internal Medicine

## 2020-09-17 DIAGNOSIS — M519 Unspecified thoracic, thoracolumbar and lumbosacral intervertebral disc disorder: Secondary | ICD-10-CM | POA: Diagnosis not present

## 2020-09-17 DIAGNOSIS — Z0000 Encounter for general adult medical examination without abnormal findings: Secondary | ICD-10-CM | POA: Diagnosis not present

## 2020-09-17 DIAGNOSIS — K5190 Ulcerative colitis, unspecified, without complications: Secondary | ICD-10-CM | POA: Diagnosis not present

## 2020-09-17 DIAGNOSIS — E538 Deficiency of other specified B group vitamins: Secondary | ICD-10-CM | POA: Diagnosis not present

## 2020-09-17 DIAGNOSIS — Z13220 Encounter for screening for lipoid disorders: Secondary | ICD-10-CM | POA: Diagnosis not present

## 2020-09-25 ENCOUNTER — Ambulatory Visit
Admission: RE | Admit: 2020-09-25 | Discharge: 2020-09-25 | Disposition: A | Discharge status: Home / Self Care | Payer: 59 | Source: Ambulatory Visit | Attending: Neurosurgery | Admitting: Neurosurgery

## 2020-09-25 ENCOUNTER — Other Ambulatory Visit: Payer: Self-pay

## 2020-09-25 DIAGNOSIS — M48061 Spinal stenosis, lumbar region without neurogenic claudication: Secondary | ICD-10-CM | POA: Diagnosis not present

## 2020-09-25 DIAGNOSIS — M5416 Radiculopathy, lumbar region: Secondary | ICD-10-CM | POA: Insufficient documentation

## 2020-09-25 DIAGNOSIS — M5127 Other intervertebral disc displacement, lumbosacral region: Secondary | ICD-10-CM | POA: Diagnosis not present

## 2020-09-25 DIAGNOSIS — M47816 Spondylosis without myelopathy or radiculopathy, lumbar region: Secondary | ICD-10-CM | POA: Diagnosis not present

## 2020-09-25 DIAGNOSIS — M5441 Lumbago with sciatica, right side: Secondary | ICD-10-CM | POA: Diagnosis not present

## 2020-09-25 DIAGNOSIS — M4807 Spinal stenosis, lumbosacral region: Secondary | ICD-10-CM | POA: Diagnosis not present

## 2020-09-25 MED ORDER — GADOBUTROL 1 MMOL/ML IV SOLN
6.0000 mL | Freq: Once | INTRAVENOUS | Status: AC | PRN
  Administered 2020-09-25: 6 mL via INTRAVENOUS

## 2020-10-02 ENCOUNTER — Other Ambulatory Visit: Payer: Self-pay | Admitting: Neurosurgery

## 2020-10-02 DIAGNOSIS — M5416 Radiculopathy, lumbar region: Secondary | ICD-10-CM | POA: Diagnosis not present

## 2020-10-10 ENCOUNTER — Other Ambulatory Visit: Payer: Self-pay | Admitting: Neurosurgery

## 2020-10-18 ENCOUNTER — Other Ambulatory Visit: Payer: Self-pay | Admitting: Neurosurgery

## 2020-10-23 ENCOUNTER — Other Ambulatory Visit: Payer: Self-pay | Admitting: Neurosurgery

## 2020-10-29 ENCOUNTER — Other Ambulatory Visit
Admission: RE | Admit: 2020-10-29 | Discharge: 2020-10-29 | Disposition: A | Discharge status: Home / Self Care | Payer: 59 | Source: Ambulatory Visit | Attending: Neurosurgery | Admitting: Neurosurgery

## 2020-10-29 ENCOUNTER — Other Ambulatory Visit: Payer: Self-pay

## 2020-10-29 DIAGNOSIS — Z01812 Encounter for preprocedural laboratory examination: Secondary | ICD-10-CM | POA: Diagnosis not present

## 2020-10-29 LAB — BASIC METABOLIC PANEL
Anion gap: 8 (ref 5–15)
BUN: 10 mg/dL (ref 6–20)
CO2: 21 mmol/L — ABNORMAL LOW (ref 22–32)
Calcium: 9.3 mg/dL (ref 8.9–10.3)
Chloride: 105 mmol/L (ref 98–111)
Creatinine, Ser: 0.68 mg/dL (ref 0.44–1.00)
GFR, Estimated: >60 mL/min (ref >60)
Glucose, Bld: 95 mg/dL (ref 70–99)
Potassium: 3.6 mmol/L (ref 3.5–5.1)
Sodium: 134 mmol/L — ABNORMAL LOW (ref 135–145)

## 2020-10-29 LAB — URINALYSIS, ROUTINE W REFLEX MICROSCOPIC
Bacteria, UA: NONE SEEN
Bilirubin Urine: NEGATIVE
Glucose, UA: NEGATIVE mg/dL
Ketones, ur: NEGATIVE mg/dL
Nitrite: NEGATIVE
Protein, ur: NEGATIVE mg/dL
Specific Gravity, Urine: 1.018 (ref 1.005–1.030)
pH: 5 (ref 5.0–8.0)

## 2020-10-29 LAB — CBC
HCT: 39.1 % (ref 36.0–46.0)
Hemoglobin: 13.3 g/dL (ref 12.0–15.0)
MCH: 29.3 pg (ref 26.0–34.0)
MCHC: 34 g/dL (ref 30.0–36.0)
MCV: 86.1 fL (ref 80.0–100.0)
Platelets: 349 10*3/uL (ref 150–400)
RBC: 4.54 MIL/uL (ref 3.87–5.11)
RDW: 13.7 % (ref 11.5–15.5)
WBC: 8.5 10*3/uL (ref 4.0–10.5)
nRBC: 0 % (ref 0.0–0.2)

## 2020-10-29 LAB — APTT: aPTT: 27 seconds (ref 24–36)

## 2020-10-29 LAB — TYPE AND SCREEN
ABO/RH(D): O POS
Antibody Screen: NEGATIVE

## 2020-10-29 LAB — PROTIME-INR
INR: 0.9 (ref 0.8–1.2)
Prothrombin Time: 12.1 seconds (ref 11.4–15.2)

## 2020-10-29 LAB — SURGICAL PCR SCREEN
MRSA, PCR: NEGATIVE
Staphylococcus aureus: NEGATIVE

## 2020-10-29 NOTE — Patient Instructions (Addendum)
Your procedure is scheduled on: Monday November 06, 2019. Report to Day Surgery inside [facility] (stop by Registration desk first floor). To find out your arrival time please call [phone] between 1PM - 3PM on Thursday November 01, 2020.  Remember: Instructions that are not followed completely may result in serious medical risk,  up to and including death, or upon the discretion of your surgeon and anesthesiologist your  surgery may need to be rescheduled.     _X__ 1. Do not eat food after midnight the night before your procedure.                 No chewing gum or hard candies. You may drink clear liquids up to 2 hours                 before you are scheduled to arrive for your surgery- DO not drink clear                 liquids within 2 hours of the start of your surgery.                 Clear Liquids include:  water, apple juice without pulp, clear Gatorade, G2 or                  Gatorade Zero (avoid Red/Purple/Blue), Black Coffee or Tea (Do not add                 anything to coffee or tea).  __X__2.  On the morning of surgery brush your teeth with toothpaste and water, you                may rinse your mouth with mouthwash if you wish.  Do not swallow any toothpaste of mouthwash.     _X__ 3.  No Alcohol for 24 hours before or after surgery.   _X__ 4.  Do Not Smoke or use e-cigarettes For 24 Hours Prior to Your Surgery.                 Do not use any chewable tobacco products for at least 6 hours prior to                 Surgery.  _X__  5.  Do not use any recreational drugs (marijuana, cocaine, heroin, ecstasy, MDMA or other)                For at least one week prior to your surgery.  Combination of these drugs with anesthesia                May have life threatening results..   __X__ 6.  Notify your doctor if there is any change in your medical condition      (cold, fever, infections).     Do not wear jewelry, make-up, hairpins, clips or nail  polish. Do not wear lotions, powders, or perfumes. You may wear deodorant. Do not shave 48 hours prior to surgery. Men may shave face and neck. Do not bring valuables to the hospital.    [facility] is not responsible for any belongings or valuables.  Contacts, dentures or bridgework may not be worn into surgery. Leave your suitcase in the car. After surgery it may be brought to your room. For patients admitted to the hospital, discharge time is determined by your treatment team.   Patients discharged the day of surgery will not be allowed to drive home.   Make  arrangements for someone to be with you for the first 24 hours of your Same Day Discharge.   __X__ Take these medicines the morning of surgery with A SIP OF WATER:    1. lamoTRIgine (LAMICTAL) 200 MG  2. HYDROcodone-acetaminophen (NORCO/VICODIN) 5-325 MG (if needed)  3.Levonorgestrel-Ethinyl Estradiol (AMETHIA) 0.15-0.03 &0.01 MG   ____ Fleet Enema (as directed)   __X__ Use CHG Soap (or wipes) as directed  ____ Use Benzoyl Peroxide Gel as instructed  ____ Use inhalers on the day of surgery  ____ Stop metformin 2 days prior to surgery    ____ Take 1/2 of usual insulin dose the night before surgery. No insulin the morning          of surgery.   __X__ Stop Anti-inflammatories such as ibuprofen (ADVIL), Aleve, naproxen, aspirin and or BC powders.    __X__ Stop supplements until after surgery.    __X__ Do not start any herbal supplements before your procedure.     If you have any questions regarding your pre-procedure instructions,  Please call Pre-admit Testing at [phone].

## 2020-10-31 ENCOUNTER — Other Ambulatory Visit: Payer: Self-pay | Admitting: Neurosurgery

## 2020-11-01 ENCOUNTER — Other Ambulatory Visit: Payer: Self-pay

## 2020-11-01 ENCOUNTER — Other Ambulatory Visit
Admission: RE | Admit: 2020-11-01 | Discharge: 2020-11-01 | Disposition: A | Discharge status: Home / Self Care | Payer: 59 | Source: Ambulatory Visit | Attending: Neurosurgery | Admitting: Neurosurgery

## 2020-11-01 DIAGNOSIS — Z20822 Contact with and (suspected) exposure to covid-19: Secondary | ICD-10-CM | POA: Insufficient documentation

## 2020-11-01 DIAGNOSIS — Z01812 Encounter for preprocedural laboratory examination: Secondary | ICD-10-CM | POA: Diagnosis not present

## 2020-11-01 LAB — SARS CORONAVIRUS 2 (TAT 6-24 HRS): SARS Coronavirus 2: NEGATIVE

## 2020-11-05 ENCOUNTER — Ambulatory Visit: Payer: 59 | Admitting: Certified Registered Nurse Anesthetist

## 2020-11-05 ENCOUNTER — Other Ambulatory Visit: Payer: Self-pay | Admitting: Nurse Practitioner

## 2020-11-05 ENCOUNTER — Encounter: Payer: Self-pay | Admitting: Neurosurgery

## 2020-11-05 ENCOUNTER — Ambulatory Visit
Admission: RE | Admit: 2020-11-05 | Discharge: 2020-11-05 | Disposition: A | Discharge status: Home / Self Care | Payer: 59 | Attending: Neurosurgery | Admitting: Neurosurgery

## 2020-11-05 ENCOUNTER — Encounter
Admission: RE | Disposition: A | Discharge status: Home / Self Care | Payer: Self-pay | Source: Home / Self Care | Attending: Neurosurgery

## 2020-11-05 ENCOUNTER — Ambulatory Visit: Payer: 59

## 2020-11-05 ENCOUNTER — Other Ambulatory Visit: Payer: Self-pay

## 2020-11-05 DIAGNOSIS — Z79899 Other long term (current) drug therapy: Secondary | ICD-10-CM | POA: Diagnosis not present

## 2020-11-05 DIAGNOSIS — Z419 Encounter for procedure for purposes other than remedying health state, unspecified: Secondary | ICD-10-CM

## 2020-11-05 DIAGNOSIS — Z793 Long term (current) use of hormonal contraceptives: Secondary | ICD-10-CM | POA: Diagnosis not present

## 2020-11-05 DIAGNOSIS — Z882 Allergy status to sulfonamides status: Secondary | ICD-10-CM | POA: Diagnosis not present

## 2020-11-05 DIAGNOSIS — M48061 Spinal stenosis, lumbar region without neurogenic claudication: Secondary | ICD-10-CM | POA: Diagnosis not present

## 2020-11-05 DIAGNOSIS — M5416 Radiculopathy, lumbar region: Secondary | ICD-10-CM | POA: Diagnosis not present

## 2020-11-05 DIAGNOSIS — M5116 Intervertebral disc disorders with radiculopathy, lumbar region: Secondary | ICD-10-CM | POA: Diagnosis not present

## 2020-11-05 DIAGNOSIS — M5441 Lumbago with sciatica, right side: Secondary | ICD-10-CM | POA: Diagnosis not present

## 2020-11-05 DIAGNOSIS — Z981 Arthrodesis status: Secondary | ICD-10-CM | POA: Diagnosis not present

## 2020-11-05 HISTORY — PX: HEMI-MICRODISCECTOMY LUMBAR LAMINECTOMY LEVEL 1: SHX5846

## 2020-11-05 LAB — POCT PREGNANCY, URINE: Preg Test, Ur: NEGATIVE

## 2020-11-05 SURGERY — HEMI-MICRODISCECTOMY LUMBAR LAMINECTOMY LEVEL 1
Anesthesia: General | Laterality: Right

## 2020-11-05 MED ORDER — PROMETHAZINE HCL 25 MG/ML IJ SOLN: 6.2500 mg | INTRAMUSCULAR | Status: DC | PRN

## 2020-11-05 MED ORDER — OXYCODONE HCL 5 MG PO TABS
ORAL_TABLET | ORAL | Status: AC
  Administered 2020-11-05: 5 mg via ORAL
  Filled 2020-11-05: qty 1

## 2020-11-05 MED ORDER — MIDAZOLAM HCL 2 MG/2ML IJ SOLN
INTRAMUSCULAR | Status: AC
  Filled 2020-11-05: qty 2

## 2020-11-05 MED ORDER — ACETAMINOPHEN 10 MG/ML IV SOLN
INTRAVENOUS | Status: AC
  Filled 2020-11-05: qty 100

## 2020-11-05 MED ORDER — SUCCINYLCHOLINE CHLORIDE 20 MG/ML IJ SOLN
INTRAMUSCULAR | Status: DC | PRN
  Administered 2020-11-05: 120 mg via INTRAVENOUS

## 2020-11-05 MED ORDER — FENTANYL CITRATE (PF) 100 MCG/2ML IJ SOLN
INTRAMUSCULAR | Status: AC
  Filled 2020-11-05: qty 2

## 2020-11-05 MED ORDER — PHENYLEPHRINE HCL (PRESSORS) 10 MG/ML IV SOLN
INTRAVENOUS | Status: AC
  Filled 2020-11-05: qty 1

## 2020-11-05 MED ORDER — PROPOFOL 10 MG/ML IV BOLUS
INTRAVENOUS | Status: AC
  Filled 2020-11-05: qty 20

## 2020-11-05 MED ORDER — ACETAMINOPHEN 500 MG PO TABS: 1000.0000 mg | ORAL_TABLET | Freq: Three times a day (TID) | ORAL | 0 refills | Status: DC | PRN

## 2020-11-05 MED ORDER — REMIFENTANIL HCL 1 MG IV SOLR
INTRAVENOUS | Status: AC
  Filled 2020-11-05: qty 1000

## 2020-11-05 MED ORDER — FAMOTIDINE 20 MG PO TABS
20.0000 mg | ORAL_TABLET | Freq: Once | ORAL | Status: AC
  Administered 2020-11-05: 20 mg via ORAL

## 2020-11-05 MED ORDER — FENTANYL CITRATE (PF) 100 MCG/2ML IJ SOLN
INTRAMUSCULAR | Status: AC
  Administered 2020-11-05: 25 ug via INTRAVENOUS
  Filled 2020-11-05: qty 2

## 2020-11-05 MED ORDER — ONDANSETRON HCL 4 MG/2ML IJ SOLN
INTRAMUSCULAR | Status: DC | PRN
  Administered 2020-11-05: 4 mg via INTRAVENOUS

## 2020-11-05 MED ORDER — DEXMEDETOMIDINE (PRECEDEX) IN NS 20 MCG/5ML (4 MCG/ML) IV SYRINGE
PREFILLED_SYRINGE | INTRAVENOUS | Status: AC
  Filled 2020-11-05: qty 5

## 2020-11-05 MED ORDER — CEFAZOLIN SODIUM-DEXTROSE 2-4 GM/100ML-% IV SOLN
2.0000 g | INTRAVENOUS | Status: AC
  Administered 2020-11-05: 2 g via INTRAVENOUS

## 2020-11-05 MED ORDER — FENTANYL CITRATE (PF) 100 MCG/2ML IJ SOLN
INTRAMUSCULAR | Status: AC
  Administered 2020-11-05: 50 ug via INTRAVENOUS
  Filled 2020-11-05: qty 2

## 2020-11-05 MED ORDER — ORAL CARE MOUTH RINSE: 15.0000 mL | Freq: Once | OROMUCOSAL | Status: AC

## 2020-11-05 MED ORDER — SODIUM CHLORIDE 0.9 % IV SOLN
INTRAVENOUS | Status: DC | PRN
  Administered 2020-11-05: 30 ug/min via INTRAVENOUS

## 2020-11-05 MED ORDER — FAMOTIDINE 20 MG PO TABS
ORAL_TABLET | ORAL | Status: AC
  Filled 2020-11-05: qty 1

## 2020-11-05 MED ORDER — PROPOFOL 10 MG/ML IV BOLUS
INTRAVENOUS | Status: DC | PRN
  Administered 2020-11-05: 50 mg via INTRAVENOUS
  Administered 2020-11-05: 150 mg via INTRAVENOUS

## 2020-11-05 MED ORDER — FENTANYL CITRATE (PF) 100 MCG/2ML IJ SOLN
INTRAMUSCULAR | Status: DC | PRN
  Administered 2020-11-05 (×4): 25 ug via INTRAVENOUS
  Administered 2020-11-05: 50 ug via INTRAVENOUS
  Administered 2020-11-05 (×2): 25 ug via INTRAVENOUS

## 2020-11-05 MED ORDER — PHENYLEPHRINE HCL (PRESSORS) 10 MG/ML IV SOLN
INTRAVENOUS | Status: DC | PRN
  Administered 2020-11-05 (×4): 100 ug via INTRAVENOUS

## 2020-11-05 MED ORDER — DEXAMETHASONE SODIUM PHOSPHATE 10 MG/ML IJ SOLN
INTRAMUSCULAR | Status: DC | PRN
  Administered 2020-11-05: 10 mg via INTRAVENOUS

## 2020-11-05 MED ORDER — DEXMEDETOMIDINE (PRECEDEX) IN NS 20 MCG/5ML (4 MCG/ML) IV SYRINGE
PREFILLED_SYRINGE | INTRAVENOUS | Status: DC | PRN
  Administered 2020-11-05 (×2): 4 ug via INTRAVENOUS
  Administered 2020-11-05: 8 ug via INTRAVENOUS
  Administered 2020-11-05: 4 ug via INTRAVENOUS

## 2020-11-05 MED ORDER — METHYLPREDNISOLONE ACETATE 40 MG/ML IJ SUSP
INTRAMUSCULAR | Status: DC | PRN
  Administered 2020-11-05: 40 mg

## 2020-11-05 MED ORDER — REMIFENTANIL HCL 1 MG IV SOLR
INTRAVENOUS | Status: DC | PRN
  Administered 2020-11-05: .1 ug/kg/min via INTRAVENOUS

## 2020-11-05 MED ORDER — CHLORHEXIDINE GLUCONATE 0.12 % MT SOLN
15.0000 mL | Freq: Once | OROMUCOSAL | Status: AC
  Administered 2020-11-05: 15 mL via OROMUCOSAL

## 2020-11-05 MED ORDER — FENTANYL CITRATE (PF) 100 MCG/2ML IJ SOLN
25.0000 ug | INTRAMUSCULAR | Status: DC | PRN
  Administered 2020-11-05: 25 ug via INTRAVENOUS
  Administered 2020-11-05: 50 ug via INTRAVENOUS

## 2020-11-05 MED ORDER — OXYCODONE HCL 5 MG/5ML PO SOLN: 5.0000 mg | Freq: Once | ORAL | Status: AC | PRN

## 2020-11-05 MED ORDER — MIDAZOLAM HCL 2 MG/2ML IJ SOLN
INTRAMUSCULAR | Status: DC | PRN
  Administered 2020-11-05: 2 mg via INTRAVENOUS

## 2020-11-05 MED ORDER — OXYCODONE HCL 5 MG PO TABS: 5.0000 mg | ORAL_TABLET | ORAL | 0 refills | Status: DC | PRN

## 2020-11-05 MED ORDER — METHOCARBAMOL 500 MG PO TABS: 500.0000 mg | ORAL_TABLET | Freq: Four times a day (QID) | ORAL | 0 refills | Status: DC

## 2020-11-05 MED ORDER — THROMBIN 5000 UNITS EX SOLR
CUTANEOUS | Status: DC | PRN
  Administered 2020-11-05: 5000 [IU] via TOPICAL

## 2020-11-05 MED ORDER — LIDOCAINE HCL (CARDIAC) PF 100 MG/5ML IV SOSY
PREFILLED_SYRINGE | INTRAVENOUS | Status: DC | PRN
  Administered 2020-11-05: 80 mg via INTRAVENOUS

## 2020-11-05 MED ORDER — GLYCOPYRROLATE 0.2 MG/ML IJ SOLN
INTRAMUSCULAR | Status: DC | PRN
  Administered 2020-11-05: .2 mg via INTRAVENOUS

## 2020-11-05 MED ORDER — LACTATED RINGERS IV SOLN: INTRAVENOUS | Status: DC

## 2020-11-05 MED ORDER — CHLORHEXIDINE GLUCONATE 0.12 % MT SOLN
OROMUCOSAL | Status: AC
  Filled 2020-11-05: qty 15

## 2020-11-05 MED ORDER — LIDOCAINE-EPINEPHRINE 1 %-1:100000 IJ SOLN
INTRAMUSCULAR | Status: DC | PRN
  Administered 2020-11-05: 7 mL

## 2020-11-05 MED ORDER — EPHEDRINE SULFATE 50 MG/ML IJ SOLN
INTRAMUSCULAR | Status: DC | PRN
  Administered 2020-11-05: 5 mg via INTRAVENOUS

## 2020-11-05 MED ORDER — CEFAZOLIN SODIUM-DEXTROSE 2-4 GM/100ML-% IV SOLN
INTRAVENOUS | Status: AC
  Filled 2020-11-05: qty 100

## 2020-11-05 MED ORDER — OXYCODONE HCL 5 MG PO TABS: 5.0000 mg | ORAL_TABLET | Freq: Once | ORAL | Status: AC | PRN

## 2020-11-05 MED ORDER — ACETAMINOPHEN 10 MG/ML IV SOLN
INTRAVENOUS | Status: DC | PRN
  Administered 2020-11-05: 1000 mg via INTRAVENOUS

## 2020-11-05 SURGICAL SUPPLY — 61 items
ADH SKN CLS APL DERMABOND .7 (GAUZE/BANDAGES/DRESSINGS) ×1
AGENT HMST MTR 8 SURGIFLO (HEMOSTASIS) ×1
APL PRP STRL LF DISP 70% ISPRP (MISCELLANEOUS) ×2
APL SRG 60D 8 XTD TIP BNDBL (TIP)
BUR NEURO DRILL SOFT 3.0X3.8M (BURR) ×2 IMPLANT
CANISTER SUCT 1200ML W/VALVE (MISCELLANEOUS) ×4 IMPLANT
CHLORAPREP W/TINT 26 (MISCELLANEOUS) ×4 IMPLANT
COUNTER NEEDLE 20/40 LG (NEEDLE) ×2 IMPLANT
COVER LIGHT HANDLE STERIS (MISCELLANEOUS) ×4 IMPLANT
COVER WAND RF STERILE (DRAPES) ×2 IMPLANT
CUP MEDICINE 2OZ PLAST GRAD ST (MISCELLANEOUS) ×2 IMPLANT
DERMABOND ADVANCED (GAUZE/BANDAGES/DRESSINGS) ×1
DERMABOND ADVANCED .7 DNX12 (GAUZE/BANDAGES/DRESSINGS) ×1 IMPLANT
DRAPE C-ARM 42X72 X-RAY (DRAPES) ×4 IMPLANT
DRAPE LAPAROTOMY 100X77 ABD (DRAPES) ×2 IMPLANT
DRAPE MICROSCOPE SPINE 48X150 (DRAPES) IMPLANT
DRAPE SURG 17X11 SM STRL (DRAPES) ×2 IMPLANT
DRSG TEGADERM 4X4.75 (GAUZE/BANDAGES/DRESSINGS) IMPLANT
DRSG TELFA 4X3 1S NADH ST (GAUZE/BANDAGES/DRESSINGS) IMPLANT
DURASEAL APPLICATOR TIP (TIP) IMPLANT
DURASEAL SPINE SEALANT 3ML (MISCELLANEOUS) IMPLANT
ELECT CAUTERY BLADE TIP 2.5 (TIP) ×2
ELECT EZSTD 165MM 6.5IN (MISCELLANEOUS) ×2
ELECT REM PT RETURN 9FT ADLT (ELECTROSURGICAL) ×2
ELECTRODE CAUTERY BLDE TIP 2.5 (TIP) ×1 IMPLANT
ELECTRODE EZSTD 165MM 6.5IN (MISCELLANEOUS) ×1 IMPLANT
ELECTRODE REM PT RTRN 9FT ADLT (ELECTROSURGICAL) ×1 IMPLANT
GAUZE SPONGE 4X4 12PLY STRL (GAUZE/BANDAGES/DRESSINGS) ×2 IMPLANT
GLOVE BIOGEL PI IND STRL 7.0 (GLOVE) ×1 IMPLANT
GLOVE BIOGEL PI INDICATOR 7.0 (GLOVE) ×1
GLOVE INDICATOR 8.0 STRL GRN (GLOVE) IMPLANT
GLOVE SURG SYN 7.0 (GLOVE) ×4 IMPLANT
GLOVE SURG SYN 8.0 (GLOVE) ×4 IMPLANT
GOWN STRL REUS W/ TWL XL LVL3 (GOWN DISPOSABLE) ×2 IMPLANT
GOWN STRL REUS W/TWL XL LVL3 (GOWN DISPOSABLE) ×4
GRADUATE 1200CC STRL 31836 (MISCELLANEOUS) ×2 IMPLANT
GRAFT DURAGEN MATRIX 1WX1L (Tissue) IMPLANT
IV CATH ANGIO 12GX3 LT BLUE (NEEDLE) ×2 IMPLANT
KIT TURNOVER KIT A (KITS) ×2 IMPLANT
KIT WILSON FRAME (KITS) ×2 IMPLANT
KNIFE BAYONET SHORT DISCETOMY (MISCELLANEOUS) ×2 IMPLANT
MANIFOLD NEPTUNE II (INSTRUMENTS) ×2 IMPLANT
MARKER SKIN DUAL TIP RULER LAB (MISCELLANEOUS) ×4 IMPLANT
NDL SAFETY ECLIPSE 18X1.5 (NEEDLE) ×1 IMPLANT
NEEDLE HYPO 18GX1.5 SHARP (NEEDLE) ×2
NEEDLE HYPO 22GX1.5 SAFETY (NEEDLE) ×2 IMPLANT
NS IRRIG 1000ML POUR BTL (IV SOLUTION) ×2 IMPLANT
PACK LAMINECTOMY NEURO (CUSTOM PROCEDURE TRAY) ×2 IMPLANT
PAD ARMBOARD 7.5X6 YLW CONV (MISCELLANEOUS) ×2 IMPLANT
SPOGE SURGIFLO 8M (HEMOSTASIS) ×1
SPONGE SURGIFLO 8M (HEMOSTASIS) ×1 IMPLANT
STAPLER SKIN PROX 35W (STAPLE) IMPLANT
SUT NURALON 4 0 TR CR/8 (SUTURE) IMPLANT
SUT POLYSORB 2-0 5X18 GS-10 (SUTURE) ×6 IMPLANT
SUT VIC AB 0 CT1 18XCR BRD 8 (SUTURE) ×1 IMPLANT
SUT VIC AB 0 CT1 8-18 (SUTURE) ×2
SYR 10ML LL (SYRINGE) ×4 IMPLANT
SYR 30ML LL (SYRINGE) ×2 IMPLANT
SYR 3ML LL SCALE MARK (SYRINGE) ×2 IMPLANT
TOWEL OR 17X26 4PK STRL BLUE (TOWEL DISPOSABLE) ×8 IMPLANT
TUBING CONNECTING 10 (TUBING) ×2 IMPLANT

## 2020-11-05 NOTE — Discharge Instructions (Signed)
Your surgeon has performed an operation on your lumbar spine (low back) to relieve pressure on one or more nerves. Many times, patients feel better immediately after surgery and can "overdo it." Even if you feel well, it is important that you follow these activity guidelines. If you do not let your back heal properly from the surgery, you can increase the chance of a disc herniation and/or return of your symptoms. The following are instructions to help in your recovery once you have been discharged from the hospital.  * Do not take anti-inflammatory medications for 3 days after surgery (naproxen [Aleve], ibuprofen [Advil, Motrin], celecoxib [Celebrex], etc.)  Activity    No bending, lifting, or twisting ("BLT"). Avoid lifting objects heavier than 10 pounds (gallon milk jug).  Where possible, avoid household activities that involve lifting, bending, pushing, or pulling such as laundry, vacuuming, grocery shopping, and childcare. Try to arrange for help from friends and family for these activities while your back heals.  Increase physical activity slowly as tolerated.  Taking short walks is encouraged, but avoid strenuous exercise. Do not jog, run, bicycle, lift weights, or participate in any other exercises unless specifically allowed by your doctor. Avoid prolonged sitting, including car rides.  Talk to your doctor before resuming sexual activity.  You should not drive until cleared by your doctor.  Until released by your doctor, you should not return to work or school.  You should rest at home and let your body heal.   You may shower two days after your surgery.  After showering, lightly dab your incision dry. Do not take a tub bath or go swimming for 3 weeks, or until approved by your doctor at your follow-up appointment.  If you smoke, we strongly recommend that you quit.  Smoking has been proven to interfere with normal healing in your back and will dramatically reduce the success rate of  your surgery. Please contact [facility] (800-QUIT-NOW) and use the resources at www.[facility].com for assistance in stopping smoking.  Surgical Incision   If you have a dressing on your incision, you may remove it three days after your surgery. Keep your incision area clean and dry.  If you have staples or stitches on your incision, you should have a follow up scheduled for removal. If you do not have staples or stitches, you will have steri-strips (small pieces of surgical tape) or Dermabond glue. The steri-strips/glue should begin to peel away within about a week (it is fine if the steri-strips fall off before then). If the strips are still in place one week after your surgery, you may gently remove them.  Diet            You may return to your usual diet. Be sure to stay hydrated.  When to Contact us  Although your surgery and recovery will likely be uneventful, you may have some residual numbness, aches, and pains in your back and/or legs. This is normal and should improve in the next few weeks.  However, should you experience any of the following, contact us immediately: . New numbness or weakness . Pain that is progressively getting worse, and is not relieved by your pain medications or rest . Bleeding, redness, swelling, pain, or drainage from surgical incision . Chills or flu-like symptoms . Fever greater than 101.0 F (38.3 C) . Problems with bowel or bladder functions . Difficulty breathing or shortness of breath . Warmth, tenderness, or swelling in your calf  Contact Information . During office hours (Monday-Friday  9 am to 5 pm), please call your physician at [phone] . After hours and weekends, please call [phone] and an answering service will put you in touch with either Dr. Lacinda Axon or Dr. Izora Ribas.  . For a life-threatening emergency, call [place]   1) The drugs that you were given will stay in your system until  tomorrow so for the next 24 hours you should not:  A) Drive an automobile B) Make any legal decisions C) Drink any alcoholic beverage   2) You may resume regular meals tomorrow.  Today it is better to start with liquids and gradually work up to solid foods.  You may eat anything you prefer, but it is better to start with liquids, then soup and crackers, and gradually work up to solid foods.   3) Please notify your doctor immediately if you have any unusual bleeding, trouble breathing, redness and pain at the surgery site, drainage, fever, or pain not relieved by medication.    4) Additional Instructions:        Please contact your physician with any problems or Same Day Surgery at [phone], Monday through Friday 6 am to 4 pm, or [place] [facility] number at [phone].Laminectomy, Care After This sheet gives you information about how to care for yourself after your procedure. Your health care provider may also give you more specific instructions. If you have problems or questions, contact your health care provider. What can I expect after the procedure? After the procedure, it is common to have:  Some pain around your incision area.  Muscle tightening (spasms) across the back. Follow these instructions at home: Medicines  Take over-the-counter and prescription medicines only as told by your health care provider.  If you were prescribed an antibiotic medicine, use it as told by your health care provider. Do not stop using the antibiotic even if you start to feel better.  If you are taking blood thinners: ? Talk with your health care provider before you take any medicines that contain aspirin or NSAIDs, such as ibuprofen. These medicines increase your risk for dangerous bleeding. ? Take your medicine exactly as told, at the same time every day. ? Avoid activities that could cause injury or bruising, and follow instructions about how to prevent falls. ? Wear a  medical alert bracelet or carry a card that lists what medicines you take.  Ask your health care provider if the medicine prescribed to you: ? Requires you to avoid driving or using heavy machinery. ? Can cause constipation. You may need to take these actions to prevent or treat constipation:  Drink enough fluid to keep your urine pale yellow.  Take over-the-counter or prescription medicines.  Eat foods that are high in fiber, such as beans, whole grains, and fresh fruits and vegetables.  Limit foods that are high in fat and processed sugars, such as fried or sweet foods. ? Do not drink alcohol if you are taking prescription pain medicine. Incision care   Follow instructions from your health care provider about how to take care of your incision area. Make sure you: ? Wash your hands with soap and water before and after you apply medicine to the area or change your bandage (dressing). If soap and water are not available, use hand sanitizer. ? Change your dressing as told by your health care provider. ? Leave stitches (sutures), skin glue, or adhesive strips in place. These skin closures may need to stay  in place for 2 weeks or longer. If adhesive strip edges start to loosen and curl up, you may trim the loose edges. Do not remove adhesive strips completely unless your health care provider tells you to do that.  Check your incision area every day for signs of infection. Check for: ? More redness, swelling, or pain. ? Fluid or blood. ? Warmth. ? Pus or a bad smell. Activity   Rest as told by your health care provider.  Avoid bending or twisting at your waist. Always bend at your knees.  Avoid sitting for a long time without moving. Get up to take short walks every 1-2 hours. This is important to improve blood flow and breathing. Ask for help if you feel weak or unsteady.  Do not lift anything that is heavier than 10 lb (4.5 kg) or the limit that your health care provider tells you,  until he or she says that it is safe.  Do exercises as told by your health care provider, including breathing exercises.  Return to your normal activities as told by your health care provider. Ask your health care provider what activities are safe for you. General instructions   Do not drive for 2 weeks after your procedure or for as long as told by your health care provider.  Do not take baths, swim, or use a hot tub for 2 weeks, or until your incision has healed completely. Ask your health care provider if you may take showers after your dressing has been removed. You may only be allowed to take sponge baths.  Do not use any products that contain nicotine or tobacco, such as cigarettes, e-cigarettes, and chewing tobacco. These can delay bone healing after surgery. If you need help quitting, ask your health care provider.  Wear compression stockings as told by your health care provider. These stockings help to prevent blood clots and reduce swelling in your legs.  Keep all follow-up visits as told by your health care provider. This is important. Contact a health care provider if:  You have more redness, swelling, or pain around your incision area.  Your incision feels warm to the touch.  You are not able to return to activities or do exercises as told by your health care provider. Get help right away if you:  Have any of these signs of infection: ? Fluid or blood coming from your incision area. ? Pus or a bad smell coming from your incision area. ? Chills or a fever.  Feel dizzy or you faint while standing.  Develop a rash.  Develop shortness of breath or you have difficulty breathing.  Cannot control when you urinate or have a bowel movement.  Become weak.  Are not able to use your legs. These symptoms may represent a serious problem that is an emergency. Do not wait to see if the symptoms will go away. Get medical help right away. Call your local emergency services (911  in the [place] Do not drive yourself to the hospital. Summary  After the procedure, it is common to have some pain around your incision area. You may also have muscle tightening (spasms) across the back.  Follow instructions from your health care provider about how to care for your incision area.  Do not lift anything that is heavier than 10 lb (4.5 kg) or the limit that your health care provider tells you, until he or she says that it is safe.  Contact your health care provider if you have more  redness, swelling, or pain around your incision area or if your incision feels warm to the touch. These can be signs of infection. This information is not intended to replace advice given to you by your health care provider. Make sure you discuss any questions you have with your health care provider. Document Revised: 05/16/2019 Document Reviewed: 05/16/2019 Elsevier Patient Education  [place].

## 2020-11-05 NOTE — Discharge Summary (Signed)
Procedure:   SECONDARY RIGHT L5/S1 HEMILAMINECTOMY & DISCECTOMY (Right      Procedure date: 11/06/2019 Diagnosis: lumbar radiculopathy    History: Tammy Deleon is s/p   SECONDARY RIGHT L5/S1 HEMILAMINECTOMY & DISCECTOMY (Right      POD0: Tolerated procedure well. Evaluated in post op recovery still disoriented from anesthesia but able to answer questions and obey commands.   Physical Exam: Vitals:   11/05/20 1001 11/05/20 1016  BP: 123/87 128/89  Pulse: (!) 107 (!) 102  Resp: 10 13  Temp: 97.9 F (36.6 C)   SpO2: 100% 100%    General: Alert and oriented, lying in bed Strength:5/5 throughout  Sensation: intact and symmetric throughout  Skin: incisional dressing clean, dry, intact  Data:  Recent Labs  Lab 10/29/20 1115  NA 134*  K 3.6  CL 105  CO2 21*  BUN 10  CREATININE 0.68  GLUCOSE 95  CALCIUM 9.3   No results for input(s): AST, ALT, ALKPHOS in the last 168 hours.  Invalid input(s): TBILI   Recent Labs  Lab 10/29/20 1115  WBC 8.5  HGB 13.3  HCT 39.1  PLT 349   Recent Labs  Lab 10/29/20 1115  APTT 27  INR 0.9         Assessment/Plan:  Tammy Deleon is POD0   SECONDARY RIGHT L5/S1 HEMILAMINECTOMY & DISCECTOMY (Right    She is cleared to discharge hoome once she ambulates, voids. Lonell Face, NP Department of Neurosurgery

## 2020-11-05 NOTE — Anesthesia Procedure Notes (Signed)
Procedure Name: Intubation Date/Time: 11/05/2020 7:25 AM Performed by: Lily Peer, Aquilla Voiles, CRNA Pre-anesthesia Checklist: Patient identified, Emergency Drugs available, Suction available and Patient being monitored Patient Re-evaluated:Patient Re-evaluated prior to induction Oxygen Delivery Method: Circle system utilized Preoxygenation: Pre-oxygenation with 100% oxygen Induction Type: IV induction Ventilation: Mask ventilation without difficulty Laryngoscope Size: McGraph and 3 Grade View: Grade I Tube type: Oral Tube size: 7.0 mm Number of attempts: 1 Airway Equipment and Method: Stylet Placement Confirmation: ETT inserted through vocal cords under direct vision,  positive ETCO2 and breath sounds checked- equal and bilateral Secured at: 20 cm Tube secured with: Tape Dental Injury: Teeth and Oropharynx as per pre-operative assessment

## 2020-11-05 NOTE — Interval H&P Note (Signed)
History and Physical Interval Note:  11/05/2020 6:37 AM  Tammy Deleon  has presented today for surgery, with the diagnosis of lumbar radiculopathy m54.16.  The various methods of treatment have been discussed with the patient and family. After consideration of risks, benefits and other options for treatment, the patient has consented to  Procedure(s): SECONDARY RIGHT L5/S1 HEMILAMINECTOMY & DISCECTOMY (Right) as a surgical intervention.  The patient's history has been reviewed, patient examined, no change in status, stable for surgery.  I have reviewed the patient's chart and labs.  Questions were answered to the patient's satisfaction.     [name]

## 2020-11-05 NOTE — H&P (Signed)
Tammy Deleon is an 35 y.o. female.   Chief Complaint: Right leg pain HPI: Tammy Deleon is here for evaluation of ongoing right leg pain. She says this is newer pain now on the right low back going into the buttocks and posterior thigh. This is not the same as it was prior to the last surgery but is just as severe. There is no associated numbness. She did go for an updated MRI given the recurrence of the pain and was found to have recurrent stenosis at L5/S1 on the right. She is finding it difficult to perform her normal activities given the pain. She denies any left-sided pain. Given this and failure to improve with medical management previously, we discussed surgical decompression    Past Medical History:  Diagnosis Date  . Anxiety   . Depression   . Irregular intermenstrual bleeding   . UTI (lower urinary tract infection)     Past Surgical History:  Procedure Laterality Date  . GALLBLADDER SURGERY    . HEMI-MICRODISCECTOMY LUMBAR LAMINECTOMY LEVEL 1 Right 01/02/2020   Procedure: RIGHT L5/S1 HEMILAMINECTOMY & DISCECTOMY;  Surgeon: [name], MD;  Location: [facility] ORS;  Service: Neurosurgery;  Laterality: Right;    History reviewed. No pertinent family history. Social History:  reports that she has never smoked. She has never used smokeless tobacco. She reports current alcohol use. She reports that she does not use drugs.  Allergies:  Allergies  Allergen Reactions  . Sulfa Antibiotics     Childhood allergy, unknown reaction    Medications Prior to Admission  Medication Sig Dispense Refill  . HYDROcodone-acetaminophen (NORCO/VICODIN) 5-325 MG tablet Take 1 tablet by mouth every 6 (six) hours as needed for moderate pain.    Marland Kitchen ibuprofen (ADVIL) 800 MG tablet Take 800 mg by mouth 3 (three) times daily as needed for pain.    Marland Kitchen lamoTRIgine (LAMICTAL) 200 MG tablet Take 200 mg by mouth daily.    . Levonorgestrel-Ethinyl Estradiol (AMETHIA) 0.15-0.03 &0.01 MG tablet Take 1 tablet by  mouth daily.    . traZODone (DESYREL) 100 MG tablet Take 100 mg by mouth at bedtime.      Results for orders placed or performed during the hospital encounter of 11/05/20 (from the past 48 hour(s))  Pregnancy, urine POC     Status: None   Collection Time: 11/05/20  6:24 AM  Result Value Ref Range   Preg Test, Ur NEGATIVE NEGATIVE    Comment:        THE SENSITIVITY OF THIS METHODOLOGY IS >24 mIU/mL    No results found.  Review of Systems General ROS: Negative Respiratory ROS: Negative Cardiovascular ROS: Negative Gastrointestinal ROS: Negative Genito-Urinary ROS: Negative Musculoskeletal ROS: Positive for back pain Neurological ROS: Positive for right leg pain Dermatological ROS: Negative   Last menstrual period 08/28/2020, unknown if currently breastfeeding. Physical Exam  General appearance: Alert, cooperative, in no acute distress CV: regular rate and rhythm Pulm: Clear to auscultation Back: Some tenderness noted over the midline and right paramedian area  Neurologic exam:  Mental status: alertness: alert, affect: normal Speech: fluent and clear Motor:strength symmetric 5/5 in bilateral hip flexion, knee flexion, knee extension, dorsiflexion, plantarflexion Sensory: intact to light touch in bilateral lower extremities Gait: normal   Imaging: MRI lumbar spine: There is a normal lordotic curvature. There is evidence of previous laminectomy at the right L5-S1 level. There is a large recurrent disc herniation at this level causing stenosis. There is no the level of central or foraminal  stenosis noted.   Assessment/Plan Proceed with secondary L5/S1 discectomy  [name], MD 11/05/2020, 6:34 AM

## 2020-11-05 NOTE — Anesthesia Preprocedure Evaluation (Addendum)
Anesthesia Evaluation  Patient identified by MRN, date of birth, ID band Patient awake    Reviewed: Allergy & Precautions, H&P , NPO status , Patient's Chart, lab work & pertinent test results  History of Anesthesia Complications Negative for: history of anesthetic complications  Airway Mallampati: I  TM Distance: >3 FB     Dental  (+) Teeth Intact   Pulmonary neg pulmonary ROS, neg sleep apnea, neg COPD, Not current smoker,    breath sounds clear to auscultation       Cardiovascular (-) hypertension(-) angina(-) Past MI and (-) Cardiac Stents (-) dysrhythmias  Rhythm:regular Rate:Normal     Neuro/Psych PSYCHIATRIC DISORDERS Anxiety Depression negative neurological ROS     GI/Hepatic negative GI ROS, Neg liver ROS,   Endo/Other  negative endocrine ROS  Renal/GU      Musculoskeletal   Abdominal   Peds  Hematology negative hematology ROS (+)   Anesthesia Other Findings Past Medical History: No date: Anxiety No date: Depression No date: Irregular intermenstrual bleeding No date: UTI (lower urinary tract infection)  Past Surgical History: No date: GALLBLADDER SURGERY 01/02/2020: HEMI-MICRODISCECTOMY LUMBAR LAMINECTOMY LEVEL 1; Right     Comment:  Procedure: RIGHT L5/S1 HEMILAMINECTOMY & DISCECTOMY;                Surgeon: [name], MD;  Location: [facility] ORS;  Service:              Neurosurgery;  Laterality: Right;     Reproductive/Obstetrics negative OB ROS                           Anesthesia Physical Anesthesia Plan  ASA: I  Anesthesia Plan: General ETT   Post-op Pain Management:    Induction:   PONV Risk Score and Plan: Ondansetron, Dexamethasone, Midazolam and Treatment may vary due to age or medical condition  Airway Management Planned:   Additional Equipment:   Intra-op Plan:   Post-operative Plan:   Informed Consent: I have reviewed the patients History and  Physical, chart, labs and discussed the procedure including the risks, benefits and alternatives for the proposed anesthesia with the patient or authorized representative who has indicated his/her understanding and acceptance.     Dental Advisory Given  Plan Discussed with: Anesthesiologist, CRNA and Surgeon  Anesthesia Plan Comments:        Anesthesia Quick Evaluation

## 2020-11-05 NOTE — Op Note (Signed)
Operative Note  SURGERY DATE:11/05/2020  PRE-OP DIAGNOSIS: Lumbar Stenosis withLumbar Radiculopathy(m48.062)  POST-OP DIAGNOSIS:Post-Op Diagnosis Codes: Lumbar Stenosis withLumbar Radiculopathy(m48.062)  Procedure(s) with comments: Right Secondary L5/S1Hemilaminectomy andDiscectomy  SURGEON:  * Malen Gauze, MD Lonell Face, NP, Asst  ANESTHESIA:General  OPERATIVE FINDINGS: Lateral recess stenosis at L5/S1on the right with scar tissue and disc material  OPERATIVE REPORT:   Indication: Ms.Phillipspresented to clinic on11/30 with recurrentright legpain after previous decompression.MRI revealedrightrecurrent L5/S1stenosiscompressingthetraversingnerve rootwith a herniated disc and scar tissue.We discussed decompression at that level.Therisks of surgery were explained to include hematoma, infection, damage to nerve roots, CSF leak, weakness, numbness, pain, need for future surgery including fusion, heart attack, and stroke.Sheelected to proceed with surgery for symptom relief.  Procedure The patient was brought to the OR after informed consent was obtained.She was given general anesthesia and intubated by the anesthesia service. Vascular access lines were placed.The patient was then placed prone on a Wilson frameensuring all pressure points were padded.Antibiotics were administered.A time-out was performed per protocol.   The patient was sterilely prepped and draped. Fluoroscopy confirmedL5/S1interspaceandthe priorincision was marked1.5cm off midlineon the right.The incision was instilled withlocal anesthetic with epinephrine. The skin was opened sharply and the dissection taken to the fascia. This was incised and initial dilator placedthe spinous processes and lamina of L5on therightout to the medial edge of the facet. Serial dilatorswere inserted via fluoroscopy and the final72m tube was placed at depth  of6cm.  The microscope was brought into the field. The overlying muscle was removed from lamina and medial facet. The previous laminotomy was identified, Scar tissue was stripped from the medial facet. Next, a matchstickdrill bit was used to remove theL5lamina centrallyand going laterally.The native underlying ligament was freed and removed with combination of rongeurs. This led to some scar tissue on the dura as well as disc material in the alteral recess. The dura was seen to be full and intact. Medially, a firm disc bulge was seen. The dura ane nerve were retracted and the disc space entered sharply. There was some firm disc material removed. The space was probed with curettes and rongeurs until a ball tip and further disc material removed. The nerve root was decompressed at this point. The epidural space was inspected and no residual disc seen. The area was irrigated profusely to remove other small fragments.Hemostasis was obtained and wound irrigated. Depomedrol was placed along thecal sac and nerve root.The working channel was removed.  The microscope was removed.Thefasciawas then closed using  0vicryl followed by thesubcutaneous and dermal layers with 2-0 vicryluntil the epidermis was well approximated. The skin was closed withDermabond..  The patient was returned to supine position and extubated by the anesthesia service. The patient was then taken to the PACU for post-operative care whereshe was moving extremities symmetrically.   ESTIMATED BLOOD LOSS: 10cc  SPECIMENS None  IMPLANT None   I performed the case in its entiretywith assistance of NP, Christine Zdeb  SDeetta Perla [place]

## 2020-11-05 NOTE — Progress Notes (Signed)
PHARMACY -  BRIEF ANTIBIOTIC NOTE   Pharmacy has received consult(s) for Cefazolin from an OR provider.  The patient's profile has been reviewed for ht/wt/allergies/indication/available labs.    One time order(s) placed for Cefazolin 2gm pre-op  Further antibiotics/pharmacy consults should be ordered by admitting physician if indicated.                       Renda Rolls, PharmD, [facility] 11/05/2020 6:22 AM

## 2020-11-05 NOTE — Transfer of Care (Signed)
Immediate Anesthesia Transfer of Care Note  Patient: Tammy Deleon  Procedure(s) Performed: SECONDARY RIGHT L5/S1 HEMILAMINECTOMY & DISCECTOMY (Right )  Patient Location: PACU  Anesthesia Type:General  Level of Consciousness: awake, alert  and oriented  Airway & Oxygen Therapy: Patient Spontanous Breathing and Patient connected to face mask oxygen  Post-op Assessment: Report given to RN and Post -op Vital signs reviewed and stable  Post vital signs: Reviewed and stable  Last Vitals:  Vitals Value Taken Time  BP 123/87 11/05/20 1001  Temp 36.6 C 11/05/20 1001  Pulse 106 11/05/20 1002  Resp 12 11/05/20 1002  SpO2 100 % 11/05/20 1002  Vitals shown include unvalidated device data.  Last Pain:  Vitals:   11/05/20 0644  TempSrc: Oral  PainSc: 7          Complications: No complications documented.

## 2020-11-05 NOTE — Anesthesia Postprocedure Evaluation (Signed)
Anesthesia Post Note  Patient: Tammy Deleon  Procedure(s) Performed: SECONDARY RIGHT L5/S1 HEMILAMINECTOMY & DISCECTOMY (Right )  Patient location during evaluation: PACU Anesthesia Type: General Level of consciousness: awake and alert Pain management: pain level controlled Vital Signs Assessment: post-procedure vital signs reviewed and stable Respiratory status: spontaneous breathing, nonlabored ventilation, respiratory function stable and patient connected to nasal cannula oxygen Cardiovascular status: blood pressure returned to baseline and stable Postop Assessment: no apparent nausea or vomiting Anesthetic complications: no   No complications documented.   Last Vitals:  Vitals:   11/05/20 1046 11/05/20 1055  BP: 119/81 119/77  Pulse: 95 66  Resp: 15 16  Temp: 36.9 C 36.9 C  SpO2: 97% 99%    Last Pain:  Vitals:   11/05/20 1055  TempSrc: Oral  PainSc: 3                  Precious Haws Eugean Arnott

## 2020-11-16 ENCOUNTER — Other Ambulatory Visit: Payer: Self-pay | Admitting: Internal Medicine

## 2021-01-01 ENCOUNTER — Other Ambulatory Visit: Payer: Self-pay | Admitting: Internal Medicine

## 2021-01-08 ENCOUNTER — Other Ambulatory Visit: Payer: Self-pay | Admitting: Neurosurgery

## 2021-01-11 ENCOUNTER — Other Ambulatory Visit: Payer: Self-pay | Admitting: Internal Medicine

## 2021-01-15 ENCOUNTER — Other Ambulatory Visit: Payer: Self-pay | Admitting: Neurosurgery

## 2021-01-15 DIAGNOSIS — Z9049 Acquired absence of other specified parts of digestive tract: Secondary | ICD-10-CM | POA: Diagnosis not present

## 2021-01-15 DIAGNOSIS — Q7649 Other congenital malformations of spine, not associated with scoliosis: Secondary | ICD-10-CM | POA: Diagnosis not present

## 2021-01-15 DIAGNOSIS — Z9889 Other specified postprocedural states: Secondary | ICD-10-CM | POA: Diagnosis not present

## 2021-01-18 ENCOUNTER — Other Ambulatory Visit: Payer: Self-pay | Admitting: Internal Medicine

## 2021-01-21 ENCOUNTER — Other Ambulatory Visit: Payer: Self-pay | Admitting: Neurosurgery

## 2021-01-21 DIAGNOSIS — M5441 Lumbago with sciatica, right side: Secondary | ICD-10-CM

## 2021-02-01 ENCOUNTER — Other Ambulatory Visit: Payer: Self-pay | Admitting: Neurosurgery

## 2021-02-04 ENCOUNTER — Other Ambulatory Visit: Payer: Self-pay

## 2021-02-04 ENCOUNTER — Ambulatory Visit
Admission: RE | Admit: 2021-02-04 | Discharge: 2021-02-04 | Disposition: A | Discharge status: Home / Self Care | Payer: 59 | Source: Ambulatory Visit | Attending: Neurosurgery | Admitting: Neurosurgery

## 2021-02-04 DIAGNOSIS — M5441 Lumbago with sciatica, right side: Secondary | ICD-10-CM | POA: Insufficient documentation

## 2021-02-04 DIAGNOSIS — M545 Low back pain, unspecified: Secondary | ICD-10-CM | POA: Diagnosis not present

## 2021-02-04 MED ORDER — GADOBUTROL 1 MMOL/ML IV SOLN
6.0000 mL | Freq: Once | INTRAVENOUS | Status: AC | PRN
  Administered 2021-02-04: 6 mL via INTRAVENOUS

## 2021-02-08 ENCOUNTER — Other Ambulatory Visit: Payer: Self-pay

## 2021-02-08 MED ORDER — HYDROCODONE-ACETAMINOPHEN 5-325 MG PO TABS
ORAL_TABLET | ORAL | 0 refills | Status: DC
Start: 2021-02-08 — End: 2021-02-19
  Filled 2021-02-11: qty 40, 10d supply, fill #0

## 2021-02-11 ENCOUNTER — Other Ambulatory Visit: Payer: Self-pay

## 2021-02-11 MED FILL — Trazodone HCl Tab 100 MG: ORAL | 90 days supply | Qty: 90 | Fill #0 | Status: AC

## 2021-02-15 ENCOUNTER — Other Ambulatory Visit: Payer: Self-pay

## 2021-02-15 MED ORDER — LEVONORGEST-ETH ESTRAD 91-DAY 0.15-0.03 &0.01 MG PO TABS
1.0000 | ORAL_TABLET | Freq: Every day | ORAL | 0 refills | Status: AC
  Filled 2021-02-15: qty 91, 91d supply, fill #0

## 2021-02-15 MED FILL — Lamotrigine Tab 200 MG: ORAL | 30 days supply | Qty: 30 | Fill #0 | Status: AC

## 2021-02-15 NOTE — Telephone Encounter (Signed)
A refill was sent in for contraception.  Please schedule an appointment so that we can continue the prescription.

## 2021-02-19 ENCOUNTER — Other Ambulatory Visit: Payer: Self-pay

## 2021-02-19 MED ORDER — HYDROCODONE-ACETAMINOPHEN 5-325 MG PO TABS
ORAL_TABLET | ORAL | 0 refills | Status: DC
  Filled 2021-02-19: qty 40, 10d supply, fill #0

## 2021-02-19 MED FILL — Ibuprofen Tab 800 MG: ORAL | 10 days supply | Qty: 30 | Fill #0 | Status: AC

## 2021-02-26 ENCOUNTER — Other Ambulatory Visit: Payer: Self-pay

## 2021-02-27 ENCOUNTER — Other Ambulatory Visit: Payer: Self-pay

## 2021-02-27 MED ORDER — HYDROCODONE-ACETAMINOPHEN 5-325 MG PO TABS
ORAL_TABLET | ORAL | 0 refills | Status: DC
  Filled 2021-02-27: qty 56, 14d supply, fill #0

## 2021-03-05 ENCOUNTER — Other Ambulatory Visit: Payer: Self-pay

## 2021-03-05 MED FILL — Ibuprofen Tab 800 MG: ORAL | 10 days supply | Qty: 30 | Fill #1 | Status: AC

## 2021-03-12 DIAGNOSIS — Z79899 Other long term (current) drug therapy: Secondary | ICD-10-CM | POA: Insufficient documentation

## 2021-03-12 DIAGNOSIS — M899 Disorder of bone, unspecified: Secondary | ICD-10-CM | POA: Insufficient documentation

## 2021-03-12 DIAGNOSIS — K5190 Ulcerative colitis, unspecified, without complications: Secondary | ICD-10-CM | POA: Insufficient documentation

## 2021-03-12 DIAGNOSIS — G894 Chronic pain syndrome: Secondary | ICD-10-CM | POA: Insufficient documentation

## 2021-03-12 DIAGNOSIS — Z789 Other specified health status: Secondary | ICD-10-CM | POA: Insufficient documentation

## 2021-03-12 NOTE — Progress Notes (Signed)
Patient: Tammy Deleon  Service Category: E/M  Provider: Gaspar Cola, MD  DOB: June 24, 1986  DOS: 03/13/2021  Referring Provider: [name], MD  MRN: [id number]  Setting: Ambulatory outpatient  PCP: Adin Hector, MD  Type: New Patient  Specialty: Interventional Pain Management    Location: Office  Delivery: Face-to-face     Primary Reason(s) for Visit: Encounter for initial evaluation of one or more chronic problems (new to examiner) potentially causing chronic pain, and posing a threat to normal musculoskeletal function. (Level of risk: High) CC: Back Pain (Lumbar right  s/p surgical procedure x 2  for herniated disc at L4/L5)  HPI  Tammy Deleon is a 35 y.o. year old, female patient, who comes for the first time to our practice referred by [name], MD for our initial evaluation of her chronic pain. She has Chronic constipation; Gestational hypertension w/o significant proteinuria in 3rd trimester; Anxiety state; B12 deficiency; Headache disorder; Loss of memory; Lumbar disc disease; Ulcerative colitis ([place]); Chronic pain syndrome; Pharmacologic therapy; Disorder of skeletal system; Problems influencing health status; Failed back surgical syndrome; Chronic low back pain (1ry area of Pain) (Right) w/o sciatica; Epidural fibrosis (S1) (Right); Lumbar facet joint syndrome (Right); Chronic hip pain (Right); Abnormal MRI, lumbar spine (02/04/2021); Lumbosacral facet arthropathy (L5-S1); and Lumbosacral lateral recess stenosis (L5-S1) (Bilateral) on their problem list. Today she comes in for evaluation of her Back Pain (Lumbar right  s/p surgical procedure x 2  for herniated disc at L4/L5)  Pain Assessment: Location: Lower,Right Back Radiating: into butt cheek and sometimes around to the front of thigh, down back of leg and across to the left side. Onset: More than a month ago Duration: Chronic pain Quality: Discomfort,Constant,Stabbing,Pressure Severity: 8 /10 (subjective,  self-reported pain score)  Effect on ADL: bending is very difficult, sitting for her job.  sometimes pressure from a bowel movement causes discomfort Timing: Constant Modifying factors: pain medications from Dr Lacinda Axon BP: (!) 159/93  HR: 89  Onset and Duration: Gradual and Present longer than 3 months Cause of pain: herniated disc Severity: Getting worse, NAS-11 at its worse: 10/10, NAS-11 at its best: 7/10, NAS-11 now: 8/10 and NAS-11 on the average: 8/10 Timing: Morning, Afternoon, During activity or exercise and After activity or exercise Aggravating Factors: Kneeling, Lifiting, Prolonged sitting, Prolonged standing, Squatting, Twisting and Walking Alleviating Factors: Hot packs, Medications and Resting Associated Problems: Numbness, Spasms, Tingling and Pain that wakes patient up Quality of Pain: Aching, Constant, Pressure-like, Sharp, Shooting, Stabbing and Throbbing Previous Examinations or Tests: MRI scan and Neurological evaluation Previous Treatments: Epidural steroid injections and Narcotic medications  According to the patient and the primary area of pain is that of the lower back (Right).  She indicates having had 2 back surgeries by Dr. Lacinda Axon.  According to the patient, the reason for the first surgery was right lower extremity pain, numbness, and weakness as well as low back pain.  However, she indicated that the symptoms on the right lower extremity were significantly worse than those of the right lower back.  After the surgery which took place around March 2021 she was completely pain-free of the lower extremity pain and the low back pain for a while until the pain began to return slowly, but only in the right side of the lower back.  She does admit continuing to have some numbness in the right lower extremity, but that problem was gone with the surgery.  By the end of June/July 2021 the pain  in the right side of the lower back had returned.  The patient then underwent a second back  surgery, this time for the pain in the right lower back, which took place around January 2022.  After that surgery, the pain continued and did not improve with the surgery.  According to the patient she is being sent to [place] by Dr. Lacinda Axon for evaluation and management of the pain as a consequence of "scar tissue".  The patient indicates that the only nerve block that she had was a steroid injection which was done at the [facility] before the surgery and it did not improve the pain, therefore leading to the surgery.  She does admit to having had physical therapy for this pain as well as having had a recent MRI after her second surgery.  Interventional treatments by Dr. Marland Kitchen Chasnis: 1. A Right L5-S1 transforaminal epidural injection (12/07/2019) under fluoroscopic guidance. 2. A Right S1 transforaminal epidural injection (12/07/2019) under fluoroscopic guidance.   In addition to the above, the patient indicates having occasional right lower extremity pain, which has occurred only twice since March.  She describes that the pain goes down to her knee On the right leg through the front and the back of the leg.  However this pain is intermittent and its not constant.  She does have numbness and that area which does seem to be constant.  Physical exam today was negative for straight leg raise, bilaterally but positive for exact reproduction of the patient's right sided low back pain with hyperextension of the lumbar spine as well as hyperextension on rotation towards the left side which triggered contralateral pain in the right lower back.  Patrick maneuver was positive on the right side for right hip arthralgia, but no pain coming from the sacroiliac joint.  The patient was able to toe walk and heel walk without any problems.  Today I took the time to provide the patient with information regarding my pain practice. The patient was informed that my practice is divided into two sections: an interventional pain  management section, as well as a completely separate and distinct medication management section. I explained that I have procedure days for my interventional therapies, and evaluation days for follow-ups and medication management. Because of the amount of documentation required during both, they are kept separated. This means that there is the possibility that she may be scheduled for a procedure on one day, and medication management the next. I have also informed her that because of staffing and facility limitations, I no longer take patients for medication management only. To illustrate the reasons for this, I gave the patient the example of surgeons, and how inappropriate it would be to refer a patient to his/her care, just to write for the post-surgical antibiotics on a surgery done by a different surgeon.   Because interventional pain management is my board-certified specialty, the patient was informed that joining my practice means that they are open to any and all interventional therapies. I made it clear that this does not mean that they will be forced to have any procedures done. What this means is that I believe interventional therapies to be essential part of the diagnosis and proper management of chronic pain conditions. Therefore, patients not interested in these interventional alternatives will be better served under the care of a different practitioner.  The patient was also made aware of my Comprehensive Pain Management Safety Guidelines where by joining my practice, they limit all of their  nerve blocks and joint injections to those done by our practice, for as long as we are retained to manage their care.   Historic Controlled Substance Pharmacotherapy Review  PMP and historical list of controlled substances: Hydrocodone/APAP 5/325, 1 tab p.o. 4 times daily; oxycodone IR 5 mg, 1 tab p.o. 3 times daily Current opioid analgesics:  Hydrocodone/APAP 5/325, 1 tab p.o. 4 times daily (#56) (filled  02/27/2021) MME/day: 20 mg/day  Historical Monitoring: The patient  reports no history of drug use. List of all UDS Test(s): No results found for: MDMA, COCAINSCRNUR, [place], [place], CANNABQUANT, THCU, [place] List of other Serum/Urine Drug Screening Test(s):  No results found for: AMPHSCRSER, BARBSCRSER, BENZOSCRSER, COCAINSCRSER, COCAINSCRNUR, PCPSCRSER, PCPQUANT, THCSCRSER, THCU, CANNABQUANT, OPIATESCRSER, OXYSCRSER, PROPOXSCRSER, ETH Historical Background Evaluation: [place] PMP: PDMP reviewed during this encounter. Online review of the past 89-monthperiod conducted.             PMP NARX Score Report:  Narcotic: 401 Sedative: 160 Stimulant: 341 [place] Department of public safety, offender search: (Editor, commissioningInformation) Non-contributory Risk Assessment Profile: Aberrant behavior: None observed or detected today Risk factors for fatal opioid overdose: None identified today PMP NARX Overdose Risk Score: 290 Fatal overdose hazard ratio (HR): Calculation deferred Non-fatal overdose hazard ratio (HR): Calculation deferred Risk of opioid abuse or dependence: 0.7-3.0% with doses ? 36 MME/day and 6.1-26% with doses ? 120 MME/day. Substance use disorder (SUD) risk level: See below Personal History of Substance Abuse (SUD-Substance use disorder):  Alcohol: Negative  Illegal Drugs: Negative  Rx Drugs: Negative  ORT Risk Level calculation: Low Risk  Opioid Risk Tool - 03/13/21 1346      Family History of Substance Abuse   Alcohol Negative    Illegal Drugs Negative    Rx Drugs Negative      Personal History of Substance Abuse   Alcohol Negative    Illegal Drugs Negative    Rx Drugs Negative      Age   Age between 127-45years  Yes      Psychological Disease   Psychological Disease Negative    Depression Negative      Total Score   Opioid Risk Tool Scoring 1    Opioid Risk Interpretation Low Risk          ORT Scoring interpretation table:  Score <3 = Low Risk for SUD  Score between  4-7 = Moderate Risk for SUD  Score >8 = High Risk for Opioid Abuse   PHQ-2 Depression Scale:  Total score:    PHQ-2 Scoring interpretation table: (Score and probability of major depressive disorder)  Score 0 = No depression  Score 1 = 15.4% Probability  Score 2 = 21.1% Probability  Score 3 = 38.4% Probability  Score 4 = 45.5% Probability  Score 5 = 56.4% Probability  Score 6 = 78.6% Probability   PHQ-9 Depression Scale:  Total score:    PHQ-9 Scoring interpretation table:  Score 0-4 = No depression  Score 5-9 = Mild depression  Score 10-14 = Moderate depression  Score 15-19 = Moderately severe depression  Score 20-27 = Severe depression (2.4 times higher risk of SUD and 2.89 times higher risk of overuse)   Pharmacologic Plan: As per protocol, I have not taken over any controlled substance management, pending the results of ordered tests and/or consults.            Initial impression: Pending review of available data and ordered tests.  Meds   Current Outpatient Medications:  .  amphetamine-dextroamphetamine (ADDERALL) 30 MG tablet, TAKE 1 TABLET BY MOUTH 2 (TWO) TIMES DAILY, Disp: 180 tablet, Rfl: 0 .  HYDROcodone-acetaminophen (NORCO/VICODIN) 5-325 MG tablet, TAKE 1 TABLET BY MOUTH EVERY 6 (SIX) HOURS AS NEEDED FOR PAIN FOR UP TO 10 DAYS, Disp: 40 tablet, Rfl: 0 .  ibuprofen (ADVIL) 800 MG tablet, TAKE 1 TABLET BY MOUTH EVERY 8 HOURS AS NEEDED FOR PAIN, Disp: 30 tablet, Rfl: 11 .  lamoTRIgine (LAMICTAL) 200 MG tablet, Take 200 mg by mouth daily., Disp: , Rfl:  .  Levonorgestrel-Ethinyl Estradiol (SIMPESSE) 0.15-0.03 &0.01 MG tablet, Take 1 tablet by mouth daily., Disp: 91 tablet, Rfl: 0 .  traZODone (DESYREL) 100 MG tablet, Take 100 mg by mouth at bedtime., Disp: , Rfl:  .  valACYclovir (VALTREX) 500 MG tablet, TAKE 1 TABLET BY MOUTH TWICE DAILY (Patient taking differently: Take 500 mg by mouth 2 (two) times daily as needed.), Disp: 12 tablet, Rfl: 4 .  phentermine (ADIPEX-P)  37.5 MG tablet, TAKE 1 TABLET (37.5 MG TOTAL) BY MOUTH EVERY MORNING BEFORE BREAKFAST, Disp: 30 tablet, Rfl: 0  Imaging Review  Lumbosacral Imaging: Lumbar MR wo contrast: Results for orders placed during the hospital encounter of 12/15/19 MR LUMBAR SPINE WO CONTRAST  Narrative CLINICAL DATA:  Right side low back pain with numbness and pain in the right leg since 09/23/2019. No known injury.  EXAM: MRI LUMBAR SPINE WITHOUT CONTRAST  TECHNIQUE: Multiplanar, multisequence MR imaging of the lumbar spine was performed. No intravenous contrast was administered.  COMPARISON:  None.  FINDINGS: Segmentation:  Standard.  Alignment:  Normal.  Vertebrae:  No fracture, evidence of discitis, or bone lesion.  Conus medullaris and cauda equina: Conus extends to the T12 level. Conus and cauda equina appear normal.  Paraspinal and other soft tissues: Negative.  Disc levels:  The T11-12 to L4-5 levels are negative.  L5-S1: There is a large right paracentral and subarticular recess disc protrusion with a superimposed extruded disc fragment extending cephalad in the subarticular recess. The extruded fragment measures approximately 1 cm AP by 1 cm transverse by 1 cm craniocaudal. The protrusion and extruded fragment deform and narrow the thecal sac and impinge on the descending right L5 and S1 roots. The foramina are open.  IMPRESSION: Large right paracentral and subarticular disc protrusion with a superimposed cephalad extending extruded disc fragment in the subarticular recess at L5-S1 result in impingement on the descending right L5 and S1 roots and marked narrowing of the thecal sac.   Electronically Signed By: Inge Rise M.D. On: 12/15/2019 13:11  Lumbar MR w/wo contrast: Results for orders placed during the hospital encounter of 02/04/21 MR Lumbar Spine W Wo Contrast  Narrative CLINICAL DATA:  Acute low back pain with right-sided sciatica. Back surgery  11/05/2020  EXAM: MRI LUMBAR SPINE WITHOUT AND WITH CONTRAST  TECHNIQUE: Multiplanar and multiecho pulse sequences of the lumbar spine were obtained without and with intravenous contrast.  CONTRAST:  31m GADAVIST GADOBUTROL 1 MMOL/ML IV SOLN  COMPARISON:  Lumbar MRI 11/26/2019  FINDINGS: Segmentation:  Normal  Alignment:  Normal  Vertebrae: Negative for fracture or mass. Mild discogenic bone marrow edema and enhancement has progressed since the prior study related to degenerative change and discectomy.  Conus medullaris and cauda equina: Conus extends to the T12 level. Conus and cauda equina appear normal.  Paraspinal and other soft tissues: Negative for paraspinous mass, adenopathy, fluid collection.  Disc levels:  L1-2: Negative  L2-3: Negative  L3-4: Negative  L4-5: Negative  L5-S1:  Interval right laminectomy and resection of large extruded disc fragment on the right. Small disc protrusion is present on the right. Epidural enhancement is seen around the right S1 nerve root. There is mild facet degeneration. Disc space narrowing and endplate spurring are present with mild subarticular stenosis bilaterally.  IMPRESSION: Postop laminectomy discectomy on the right at L5-S1. There is marked interval improved. Small right-sided disc protrusion with surrounding epidural scar formation is present. Right S1 nerve root appears adequately decompressed.   Electronically Signed By: Franchot Gallo M.D. On: 02/04/2021 13:50  Lumbar DG 2-3 views: Results for orders placed during the hospital encounter of 11/05/20 DG Lumbar Spine 2-3 Views  Narrative CLINICAL DATA:  Surgery  EXAM: LUMBAR SPINE - 2-3 VIEW; DG C-ARM 1-60 MIN  COMPARISON:  MRI of the lumbar spine September 25, 2020.  FINDINGS: Fluoro time: 12 seconds.  Four C-arm fluoroscopic images were obtained intraoperatively and submitted for post operative interpretation. These images demonstrate surgical  probe at the L5-S1 disc space posteriorly. Please see the performing provider's procedural report for further detail.  IMPRESSION: Intraoperative fluoroscopic imaging, as detailed above.   Electronically Signed By: Margaretha Sheffield MD On: 11/05/2020 09:47  Complexity Note: Imaging results reviewed. Results shared with Ms. Hardin Negus, using [employer].                        ROS  Cardiovascular: No reported cardiovascular signs or symptoms such as High blood pressure, coronary artery disease, abnormal heart rate or rhythm, heart attack, blood thinner therapy or heart weakness and/or failure Pulmonary or Respiratory: No reported pulmonary signs or symptoms such as wheezing and difficulty taking a deep full breath (Asthma), difficulty blowing air out (Emphysema), coughing up mucus (Bronchitis), persistent dry cough, or temporary stoppage of breathing during sleep Neurological: No reported neurological signs or symptoms such as seizures, abnormal skin sensations, urinary and/or fecal incontinence, being born with an abnormal open spine and/or a tethered spinal cord Psychological-Psychiatric: Anxiousness Gastrointestinal: Alternating episodes iof diarrhea and constipation (IBS-Irritable bowe syndrome) Genitourinary: No reported renal or genitourinary signs or symptoms such as difficulty voiding or producing urine, peeing blood, non-functioning kidney, kidney stones, difficulty emptying the bladder, difficulty controlling the flow of urine, or chronic kidney disease Hematological: Brusing easily Endocrine: No reported endocrine signs or symptoms such as high or low blood sugar, rapid heart rate due to high thyroid levels, obesity or weight gain due to slow thyroid or thyroid disease Rheumatologic: No reported rheumatological signs and symptoms such as fatigue, joint pain, tenderness, swelling, redness, heat, stiffness, decreased range of motion, with or without associated rash Musculoskeletal:  Negative for myasthenia gravis, muscular dystrophy, multiple sclerosis or malignant hyperthermia Work History: Working full time  Allergies  Ms. Mutz is allergic to sulfa antibiotics.  Laboratory Chemistry Profile   Renal Lab Results  Component Value Date   BUN 10 10/29/2020   CREATININE 0.68 10/29/2020   LABCREA 208 09/21/2019   GFRAA >60 12/30/2019   GFRNONAA >60 10/29/2020   SPECGRAV 1.015 01/11/2016   PHUR 7.5 01/11/2016   PROTEINUR NEGATIVE 10/29/2020     Electrolytes Lab Results  Component Value Date   NA 134 (L) 10/29/2020   K 3.6 10/29/2020   CL 105 10/29/2020   CALCIUM 9.3 10/29/2020     Hepatic Lab Results  Component Value Date   AST 17 09/22/2019   ALT 11 09/22/2019   ALBUMIN 2.5 (L) 09/22/2019   ALKPHOS 311 (H) 09/22/2019   LIPASE  80 03/17/2012     ID Lab Results  Component Value Date   HIV Non-reactive 09/08/2019   [place] NEGATIVE 11/01/2020   STAPHAUREUS NEGATIVE 10/29/2020   MRSAPCR NEGATIVE 10/29/2020   PREGTESTUR NEGATIVE 11/05/2020     Bone No results found for: VD25OH, DJ242AS3MHD, QQ2297LG9, QJ1941DE0, 25OHVITD1, 25OHVITD2, 25OHVITD3, TESTOFREE, TESTOSTERONE   Endocrine Lab Results  Component Value Date   GLUCOSE 95 10/29/2020   GLUCOSEU NEGATIVE 10/29/2020   TSH 1.21 09/29/2012     Neuropathy Lab Results  Component Value Date   HIV Non-reactive 09/08/2019     CNS No results found for: COLORCSF, APPEARCSF, RBCCOUNTCSF, WBCCSF, POLYSCSF, LYMPHSCSF, EOSCSF, PROTEINCSF, GLUCCSF, JCVIRUS, CSFOLI, IGGCSF, LABACHR, ACETBL, LABACHR, ACETBL   Inflammation (CRP: Acute  ESR: Chronic) Lab Results  Component Value Date   ESRSEDRATE 4 03/17/2012     Rheumatology No results found for: RF, ANA, LABURIC, URICUR, LYMEIGGIGMAB, LYMEABIGMQN, HLAB27   Coagulation Lab Results  Component Value Date   INR 0.9 10/29/2020   LABPROT 12.1 10/29/2020   APTT 27 10/29/2020   PLT 349 10/29/2020     Cardiovascular Lab Results   Component Value Date   HGB 13.3 10/29/2020   HCT 39.1 10/29/2020     Screening Lab Results  Component Value Date   SARSCOV2NAA NEGATIVE 11/01/2020   STAPHAUREUS NEGATIVE 10/29/2020   MRSAPCR NEGATIVE 10/29/2020   HIV Non-reactive 09/08/2019   PREGTESTUR NEGATIVE 11/05/2020     Cancer No results found for: CEA, CA125, LABCA2   Allergens No results found for: ALMOND, APPLE, ASPARAGUS, AVOCADO, BANANA, BARLEY, BASIL, BAYLEAF, GREENBEAN, LIMABEAN, WHITEBEAN, BEEFIGE, REDBEET, BLUEBERRY, BROCCOLI, CABBAGE, MELON, CARROT, CASEIN, CASHEWNUT, CAULIFLOWER, CELERY     Note: Lab results reviewed.  [place]  Drug: Ms. Herst  reports no history of drug use. Alcohol:  reports current alcohol use. Tobacco:  reports that she has never smoked. She has never used smokeless tobacco. Medical:  has a past medical history of Anxiety, Depression, Irregular intermenstrual bleeding, and UTI (lower urinary tract infection). Family: family history is not on file.  Past Surgical History:  Procedure Laterality Date  . GALLBLADDER SURGERY    . HEMI-MICRODISCECTOMY LUMBAR LAMINECTOMY LEVEL 1 Right 01/02/2020   Procedure: RIGHT L5/S1 HEMILAMINECTOMY & DISCECTOMY;  Surgeon: [name], MD;  Location: [facility] ORS;  Service: Neurosurgery;  Laterality: Right;  . HEMI-MICRODISCECTOMY LUMBAR LAMINECTOMY LEVEL 1 Right 11/05/2020   Procedure: SECONDARY RIGHT L5/S1 HEMILAMINECTOMY & DISCECTOMY;  Surgeon: [name], MD;  Location: [facility] ORS;  Service: Neurosurgery;  Laterality: Right;   Active Ambulatory Problems    Diagnosis Date Noted  . Chronic constipation 11/15/2015  . Gestational hypertension w/o significant proteinuria in 3rd trimester 09/21/2019  . Anxiety state 03/19/2016  . B12 deficiency 06/01/2017  . Headache disorder 08/24/2015  . Loss of memory 01/13/2017  . Lumbar disc disease 09/17/2020  . Ulcerative colitis ([place]) 03/12/2021  . Chronic pain syndrome 03/12/2021  . Pharmacologic therapy 03/12/2021   . Disorder of skeletal system 03/12/2021  . Problems influencing health status 03/12/2021  . Failed back surgical syndrome 03/13/2021  . Chronic low back pain (1ry area of Pain) (Right) w/o sciatica 03/13/2021  . Epidural fibrosis (S1) (Right) 03/13/2021  . Lumbar facet joint syndrome (Right) 03/13/2021  . Chronic hip pain (Right) 03/13/2021  . Abnormal MRI, lumbar spine (02/04/2021) 03/13/2021  . Lumbosacral facet arthropathy (L5-S1) 03/13/2021  . Lumbosacral lateral recess stenosis (L5-S1) (Bilateral) 03/13/2021   Resolved Ambulatory Problems    Diagnosis Date Noted  . No Resolved Ambulatory Problems  Past Medical History:  Diagnosis Date  . Anxiety   . Depression   . Irregular intermenstrual bleeding   . UTI (lower urinary tract infection)    Constitutional Exam  General appearance: Well nourished, well developed, and well hydrated. In no apparent acute distress Vitals:   03/13/21 1337  BP: (!) 159/93  Pulse: 89  Resp: 16  Temp: (!) 97.2 F (36.2 C)  TempSrc: Temporal  SpO2: 100%  Weight: 164 lb 12.8 oz (74.8 kg)  Height: _0  (1.575 m)   BMI Assessment: Estimated body mass index is 30.14 kg/m as calculated from the following:   Height as of this encounter: _1  (1.575 m).   Weight as of this encounter: 164 lb 12.8 oz (74.8 kg).  BMI interpretation table: BMI level Category Range association with higher incidence of chronic pain  <18 kg/m2 Underweight   18.5-24.9 kg/m2 Ideal body weight   25-29.9 kg/m2 Overweight Increased incidence by 20%  30-34.9 kg/m2 Obese (Class I) Increased incidence by 68%  35-39.9 kg/m2 Severe obesity (Class II) Increased incidence by 136%  >40 kg/m2 Extreme obesity (Class III) Increased incidence by 254%   Patient's current BMI Ideal Body weight  Body mass index is 30.14 kg/m. Ideal body weight: 50.1 kg (110 lb 7.2 oz) Adjusted ideal body weight: 60 kg (132 lb 3 oz)   BMI Readings from Last 4 Encounters:  03/13/21 30.14 kg/m   10/29/20 28.84 kg/m  12/29/19 27.98 kg/m  09/21/19 29.08 kg/m   Wt Readings from Last 4 Encounters:  03/13/21 164 lb 12.8 oz (74.8 kg)  10/29/20 157 lb 11.2 oz (71.5 kg)  12/29/19 153 lb (69.4 kg)  09/21/19 159 lb (72.1 kg)    Psych/Mental status: Alert, oriented x 3 (person, place, & time)       Eyes: PERLA Respiratory: No evidence of acute respiratory distress  Assessment  Primary Diagnosis & Pertinent Problem List: The primary encounter diagnosis was Chronic pain syndrome. Diagnoses of Pharmacologic therapy, Disorder of skeletal system, Problems influencing health status, Failed back surgical syndrome, Chronic low back pain (1ry area of Pain) (Right) w/o sciatica, Epidural fibrosis, Chronic hip pain (Right), Abnormal MRI, lumbar spine (02/04/2021), Lumbosacral facet arthropathy (L5-S1), Lumbosacral lateral recess stenosis (L5-S1) (Bilateral), and Lumbar facet joint syndrome (Right) were also pertinent to this visit.  Visit Diagnosis (New problems to examiner): 1. Chronic pain syndrome   2. Pharmacologic therapy   3. Disorder of skeletal system   4. Problems influencing health status   5. Failed back surgical syndrome   6. Chronic low back pain (1ry area of Pain) (Right) w/o sciatica   7. Epidural fibrosis   8. Chronic hip pain (Right)   9. Abnormal MRI, lumbar spine (02/04/2021)   10. Lumbosacral facet arthropathy (L5-S1)   11. Lumbosacral lateral recess stenosis (L5-S1) (Bilateral)   12. Lumbar facet joint syndrome (Right)    Plan of Care (Initial workup plan)  Note: Ms. Randal was reminded that as per protocol, today's visit has been an evaluation only. We have not taken over the patient's controlled substance management.  Problem-specific plan: No problem-specific Assessment & Plan notes found for this encounter.   Lab Orders     Compliance Drug Analysis, Ur     Comp. Metabolic Panel (12)     Magnesium     Vitamin B12     Sedimentation rate     25-Hydroxy  vitamin D Lcms D2+D3     C-reactive protein  Imaging Orders  DG Lumbar Spine Complete W/Bend     DG HIP UNILAT W OR W/O PELVIS 2-3 VIEWS RIGHT Referral Orders  No referral(s) requested today   Procedure Orders    No procedure(s) ordered today   Pharmacotherapy (current): Medications ordered:  No orders of the defined types were placed in this encounter.  Medications administered during this visit: Benita D. Forquer had no medications administered during this visit.   Pharmacological management options:  Opioid Analgesics: The patient was informed that there is no guarantee that she would be a candidate for opioid analgesics. The decision will be made following CDC guidelines. This decision will be based on the results of diagnostic studies, as well as Ms. Napier's risk profile.   Membrane stabilizer: To be determined at a later time  Muscle relaxant: To be determined at a later time  NSAID: To be determined at a later time  Other analgesic(s): To be determined at a later time   Interventional management options: Ms. Reimann was informed that there is no guarantee that she would be a candidate for interventional therapies. The decision will be based on the results of diagnostic studies, as well as Ms. Ludtke's risk profile.  Procedure(s) under consideration:  Diagnostic right caudal ESI #1 + diagnostic epidurogram  Possible candidate for right-sided Racz procedure  Diagnostic right lumbar facet block  Possible candidate for right thoracolumbar spinal cord stimulator trial/implant    Provider-requested follow-up: Return for (F2F), (40 min) 2V on evaluation day, (s/p Tests).  No future appointments.  Note by: Gaspar Cola, MD Date: 03/13/2021; Time: 4:37 PM

## 2021-03-13 ENCOUNTER — Encounter: Payer: Self-pay | Admitting: Pain Medicine

## 2021-03-13 ENCOUNTER — Other Ambulatory Visit: Payer: Self-pay | Admitting: Pain Medicine

## 2021-03-13 ENCOUNTER — Other Ambulatory Visit: Payer: Self-pay

## 2021-03-13 ENCOUNTER — Ambulatory Visit: Payer: 59 | Attending: Pain Medicine | Admitting: Pain Medicine

## 2021-03-13 VITALS — BP 159/93 | HR 89 | Temp 97.2°F | Resp 16 | Ht 62.0 in | Wt 164.8 lb

## 2021-03-13 DIAGNOSIS — M961 Postlaminectomy syndrome, not elsewhere classified: Secondary | ICD-10-CM | POA: Insufficient documentation

## 2021-03-13 DIAGNOSIS — Z79899 Other long term (current) drug therapy: Secondary | ICD-10-CM | POA: Insufficient documentation

## 2021-03-13 DIAGNOSIS — R937 Abnormal findings on diagnostic imaging of other parts of musculoskeletal system: Secondary | ICD-10-CM | POA: Insufficient documentation

## 2021-03-13 DIAGNOSIS — M4807 Spinal stenosis, lumbosacral region: Secondary | ICD-10-CM | POA: Diagnosis present

## 2021-03-13 DIAGNOSIS — G894 Chronic pain syndrome: Secondary | ICD-10-CM | POA: Diagnosis not present

## 2021-03-13 DIAGNOSIS — M47817 Spondylosis without myelopathy or radiculopathy, lumbosacral region: Secondary | ICD-10-CM | POA: Diagnosis present

## 2021-03-13 DIAGNOSIS — M899 Disorder of bone, unspecified: Secondary | ICD-10-CM | POA: Insufficient documentation

## 2021-03-13 DIAGNOSIS — M545 Low back pain, unspecified: Secondary | ICD-10-CM | POA: Diagnosis not present

## 2021-03-13 DIAGNOSIS — Z789 Other specified health status: Secondary | ICD-10-CM | POA: Insufficient documentation

## 2021-03-13 DIAGNOSIS — G96198 Other disorders of meninges, not elsewhere classified: Secondary | ICD-10-CM | POA: Diagnosis not present

## 2021-03-13 DIAGNOSIS — M47816 Spondylosis without myelopathy or radiculopathy, lumbar region: Secondary | ICD-10-CM | POA: Insufficient documentation

## 2021-03-13 DIAGNOSIS — G8929 Other chronic pain: Secondary | ICD-10-CM | POA: Insufficient documentation

## 2021-03-13 DIAGNOSIS — M25551 Pain in right hip: Secondary | ICD-10-CM | POA: Diagnosis not present

## 2021-03-13 NOTE — Patient Instructions (Signed)
______________________________________________________________________________________________  Specialty Pain Scale  Introduction:  There are significant differences in how pain is reported. The word pain usually refers to physical pain, but it is also a common synonym of suffering. The medical community uses a scale from 0 (zero) to 10 (ten) to report pain level. Zero (0) is described as "no pain", while ten (10) is described as "the worse pain you can imagine". The problem with this scale is that physical pain is reported along with suffering. Suffering refers to mental pain, or more often yet it refers to any unpleasant feeling, emotion or aversion associated with the perception of harm or threat of harm. It is the psychological component of pain.  Pain Specialists prefer to separate the two components. The pain scale used by this practice is the Verbal Numerical Rating Scale (VNRS-11). This scale is for the physical pain only. DO NOT INCLUDE how your pain psychologically affects you. This scale is for adults 107 years of age and older. It has 11 (eleven) levels. The 1st level is 0/10. This means: "right now, I have no pain". In the context of pain management, it also means: "right now, my physical pain is under control with the current therapy".  General Information:  The scale should reflect your current level of pain. Unless you are specifically asked for the level of your worst pain, or your average pain. If you are asked for one of these two, then it should be understood that it is over the past 24 hours.  Levels 1 (one) through 5 (five) are described below, and can be treated as an outpatient. Ambulatory pain management facilities such as ours are more than adequate to treat these levels. Levels 6 (six) through 10 (ten) are also described below, however, these must be treated as a hospitalized patient. While levels 6 (six) and 7 (seven) may be evaluated at an urgent care facility, levels 8  (eight) through 10 (ten) constitute medical emergencies and as such, they belong in a hospital's emergency department. When having these levels (as described below), do not come to our office. Our facility is not equipped to manage these levels. Go directly to an urgent care facility or an emergency department to be evaluated.  Definitions:  Activities of Daily Living (ADL): Activities of daily living (ADL or ADLs) is a term used in healthcare to refer to people's daily self-care activities. Health professionals often use a person's ability or inability to perform ADLs as a measurement of their functional status, particularly in regard to people post injury, with disabilities and the elderly. There are two ADL levels: Basic and Instrumental. Basic Activities of Daily Living (BADL  or BADLs) consist of self-care tasks that include: Bathing and showering; personal hygiene and grooming (including brushing/combing/styling hair); dressing; Toilet hygiene (getting to the toilet, cleaning oneself, and getting back up); eating and self-feeding (not including cooking or chewing and swallowing); functional mobility, often referred to as "transferring", as measured by the ability to walk, get in and out of bed, and get into and out of a chair; the broader definition (moving from one place to another while performing activities) is useful for people with different physical abilities who are still able to get around independently. Basic ADLs include the things many people do when they get up in the morning and get ready to go out of the house: get out of bed, go to the toilet, bathe, dress, groom, and eat. On the average, loss of function typically follows a particular order.  Hygiene is the first to go, followed by loss of toilet use and locomotion. The last to go is the ability to eat. When there is only one remaining area in which the person is independent, there is a 62.9% chance that it is eating and only a 3.5% chance  that it is hygiene. Instrumental Activities of Daily Living (IADL or IADLs) are not necessary for fundamental functioning, but they let an individual live independently in a community. IADL consist of tasks that include: cleaning and maintaining the house; home establishment and maintenance; care of others (including selecting and supervising caregivers); care of pets; child rearing; managing money; managing financials (investments, etc.); meal preparation and cleanup; shopping for groceries and necessities; moving within the community; safety procedures and emergency responses; health management and maintenance (taking prescribed medications); and using the telephone or other form of communication.  Instructions:  Most patients tend to report their pain as a combination of two factors, their physical pain and their psychosocial pain. This last one is also known as "suffering" and it is reflection of how physical pain affects you socially and psychologically. From now on, report them separately.  From this point on, when asked to report your pain level, report only your physical pain. Use the following table for reference.  Pain Clinic Pain Levels (0-5/10)  Pain Level Score  Description  No Pain 0   Mild pain 1 Nagging, annoying, but does not interfere with basic activities of daily living (ADL). Patients are able to eat, bathe, get dressed, toileting (being able to get on and off the toilet and perform personal hygiene functions), transfer (move in and out of bed or a chair without assistance), and maintain continence (able to control bladder and bowel functions). Blood pressure and heart rate are unaffected. A normal heart rate for a healthy adult ranges from 60 to 100 bpm (beats per minute).   Mild to moderate pain 2 Noticeable and distracting. Impossible to hide from other people. More frequent flare-ups. Still possible to adapt and function close to normal. It can be very annoying and may have  occasional stronger flare-ups. With discipline, patients may get used to it and adapt.   Moderate pain 3 Interferes significantly with activities of daily living (ADL). It becomes difficult to feed, bathe, get dressed, get on and off the toilet or to perform personal hygiene functions. Difficult to get in and out of bed or a chair without assistance. Very distracting. With effort, it can be ignored when deeply involved in activities.   Moderately severe pain 4 Impossible to ignore for more than a few minutes. With effort, patients may still be able to manage work or participate in some social activities. Very difficult to concentrate. Signs of autonomic nervous system discharge are evident: dilated pupils (mydriasis); mild sweating (diaphoresis); sleep interference. Heart rate becomes elevated (>115 bpm). Diastolic blood pressure (lower number) rises above 100 mmHg. Patients find relief in laying down and not moving.   Severe pain 5 Intense and extremely unpleasant. Associated with frowning face and frequent crying. Pain overwhelms the senses.  Ability to do any activity or maintain social relationships becomes significantly limited. Conversation becomes difficult. Pacing back and forth is common, as getting into a comfortable position is nearly impossible. Pain wakes you up from deep sleep. Physical signs will be obvious: pupillary dilation; increased sweating; goosebumps; brisk reflexes; cold, clammy hands and feet; nausea, vomiting or dry heaves; loss of appetite; significant sleep disturbance with inability to fall asleep or to  remain asleep. When persistent, significant weight loss is observed due to the complete loss of appetite and sleep deprivation.  Blood pressure and heart rate becomes significantly elevated. Caution: If elevated blood pressure triggers a pounding headache associated with blurred vision, then the patient should immediately seek attention at an urgent or emergency care unit, as  these may be signs of an impending stroke.    Emergency Department Pain Levels (6-10/10)  Emergency Room Pain 6 Severely limiting. Requires emergency care and should not be seen or managed at an outpatient pain management facility. Communication becomes difficult and requires great effort. Assistance to reach the emergency department may be required. Facial flushing and profuse sweating along with potentially dangerous increases in heart rate and blood pressure will be evident.   Distressing pain 7 Self-care is very difficult. Assistance is required to transport, or use restroom. Assistance to reach the emergency department will be required. Tasks requiring coordination, such as bathing and getting dressed become very difficult.   Disabling pain 8 Self-care is no longer possible. At this level, pain is disabling. The individual is unable to do even the most "basic" activities such as walking, eating, bathing, dressing, transferring to a bed, or toileting. Fine motor skills are lost. It is difficult to think clearly.   Incapacitating pain 9 Pain becomes incapacitating. Thought processing is no longer possible. Difficult to remember your own name. Control of movement and coordination are lost.   The worst pain imaginable 10 At this level, most patients pass out from pain. When this level is reached, collapse of the autonomic nervous system occurs, leading to a sudden drop in blood pressure and heart rate. This in turn results in a temporary and dramatic drop in blood flow to the brain, leading to a loss of consciousness. Fainting is one of the body's self defense mechanisms. Passing out puts the brain in a calmed state and causes it to shut down for a while, in order to begin the healing process.    Summary: 1.   Refer to this scale when providing [place] with your pain level. 2.   Be accurate and careful when reporting your pain level. This will help with your care. 3.   Over-reporting your pain level  will lead to loss of credibility. 4.   Even a level of 1/10 means that there is pain and will be treated at our facility. 5.   High, inaccurate reporting will be documented as "Symptom Exaggeration", leading to loss of credibility and suspicions of possible secondary gains such as obtaining more narcotics, or wanting to appear disabled, for fraudulent reasons. 6.   Only pain levels of 5 or below will be seen at our facility. 7.   Pain levels of 6 and above will be sent to the Emergency Department and the appointment cancelled.  ______________________________________________________________________________________________

## 2021-03-14 ENCOUNTER — Ambulatory Visit
Admission: RE | Admit: 2021-03-14 | Discharge: 2021-03-14 | Disposition: A | Discharge status: Home / Self Care | Payer: 59 | Source: Ambulatory Visit | Attending: Pain Medicine | Admitting: Pain Medicine

## 2021-03-14 ENCOUNTER — Other Ambulatory Visit: Payer: Self-pay

## 2021-03-14 DIAGNOSIS — G8929 Other chronic pain: Secondary | ICD-10-CM

## 2021-03-14 DIAGNOSIS — M5137 Other intervertebral disc degeneration, lumbosacral region: Secondary | ICD-10-CM | POA: Diagnosis not present

## 2021-03-14 DIAGNOSIS — M961 Postlaminectomy syndrome, not elsewhere classified: Secondary | ICD-10-CM | POA: Insufficient documentation

## 2021-03-14 DIAGNOSIS — M25551 Pain in right hip: Secondary | ICD-10-CM

## 2021-03-14 DIAGNOSIS — M545 Low back pain, unspecified: Secondary | ICD-10-CM | POA: Diagnosis not present

## 2021-03-14 DIAGNOSIS — M5136 Other intervertebral disc degeneration, lumbar region: Secondary | ICD-10-CM | POA: Diagnosis not present

## 2021-03-14 MED ORDER — HYDROCODONE-ACETAMINOPHEN 5-325 MG PO TABS
ORAL_TABLET | ORAL | 0 refills | Status: DC
  Filled 2021-03-14: qty 56, 14d supply, fill #0

## 2021-03-15 ENCOUNTER — Other Ambulatory Visit: Payer: Self-pay

## 2021-03-18 ENCOUNTER — Other Ambulatory Visit: Payer: Self-pay

## 2021-03-18 MED FILL — Trazodone HCl Tab 100 MG: ORAL | 90 days supply | Qty: 90 | Fill #1 | Status: CN

## 2021-03-18 MED FILL — Lamotrigine Tab 200 MG: ORAL | 30 days supply | Qty: 30 | Fill #1 | Status: AC

## 2021-03-20 LAB — 25-HYDROXY VITAMIN D LCMS D2+D3
25-Hydroxy, Vitamin D-2: <1 ng/mL
25-Hydroxy, Vitamin D-3: 50 ng/mL
25-Hydroxy, Vitamin D: 50 ng/mL

## 2021-03-20 LAB — COMP. METABOLIC PANEL (12)
AST: 18 IU/L (ref 0–40)
Albumin/Globulin Ratio: 1.6 (ref 1.2–2.2)
Albumin: 4.4 g/dL (ref 3.8–4.8)
Alkaline Phosphatase: 67 IU/L (ref 44–121)
BUN/Creatinine Ratio: 17 (ref 9–23)
BUN: 15 mg/dL (ref 6–20)
Bilirubin Total: <0.2 mg/dL (ref 0.0–1.2)
Calcium: 9.9 mg/dL (ref 8.7–10.2)
Chloride: 103 mmol/L (ref 96–106)
Creatinine, Ser: 0.89 mg/dL (ref 0.57–1.00)
Globulin, Total: 2.7 g/dL (ref 1.5–4.5)
Glucose: 85 mg/dL (ref 65–99)
Potassium: 4.7 mmol/L (ref 3.5–5.2)
Sodium: 139 mmol/L (ref 134–144)
Total Protein: 7.1 g/dL (ref 6.0–8.5)
eGFR: 87 mL/min/{1.73_m2} (ref >59)

## 2021-03-20 LAB — MAGNESIUM: Magnesium: 1.9 mg/dL (ref 1.6–2.3)

## 2021-03-20 LAB — SEDIMENTATION RATE: Sed Rate: 14 mm/hr (ref 0–32)

## 2021-03-20 LAB — VITAMIN B12: Vitamin B-12: 345 pg/mL (ref 232–1245)

## 2021-03-20 LAB — C-REACTIVE PROTEIN: CRP: 9 mg/L (ref 0–10)

## 2021-03-21 LAB — COMPLIANCE DRUG ANALYSIS, UR

## 2021-03-22 ENCOUNTER — Other Ambulatory Visit: Payer: Self-pay

## 2021-03-22 MED FILL — Ibuprofen Tab 800 MG: ORAL | 10 days supply | Qty: 30 | Fill #2 | Status: AC

## 2021-03-25 ENCOUNTER — Other Ambulatory Visit: Payer: Self-pay

## 2021-03-26 ENCOUNTER — Other Ambulatory Visit: Payer: Self-pay

## 2021-03-26 MED ORDER — HYDROCODONE-ACETAMINOPHEN 5-325 MG PO TABS
ORAL_TABLET | ORAL | 0 refills | Status: DC
  Filled 2021-03-29 – 2021-03-31 (×2): qty 56, 14d supply, fill #0

## 2021-03-29 ENCOUNTER — Other Ambulatory Visit: Payer: Self-pay

## 2021-04-02 ENCOUNTER — Other Ambulatory Visit: Payer: Self-pay

## 2021-04-04 ENCOUNTER — Other Ambulatory Visit: Payer: Self-pay

## 2021-04-04 MED FILL — Ibuprofen Tab 800 MG: ORAL | 10 days supply | Qty: 30 | Fill #3 | Status: AC

## 2021-04-09 ENCOUNTER — Other Ambulatory Visit: Payer: Self-pay

## 2021-04-09 ENCOUNTER — Encounter: Payer: Self-pay | Admitting: Pain Medicine

## 2021-04-09 ENCOUNTER — Ambulatory Visit: Payer: 59 | Attending: Pain Medicine | Admitting: Pain Medicine

## 2021-04-09 VITALS — BP 140/95 | HR 100 | Temp 98.2°F | Ht 62.0 in | Wt 162.0 lb

## 2021-04-09 DIAGNOSIS — M545 Low back pain, unspecified: Secondary | ICD-10-CM | POA: Insufficient documentation

## 2021-04-09 DIAGNOSIS — R937 Abnormal findings on diagnostic imaging of other parts of musculoskeletal system: Secondary | ICD-10-CM | POA: Diagnosis not present

## 2021-04-09 DIAGNOSIS — G894 Chronic pain syndrome: Secondary | ICD-10-CM | POA: Diagnosis not present

## 2021-04-09 DIAGNOSIS — M533 Sacrococcygeal disorders, not elsewhere classified: Secondary | ICD-10-CM | POA: Insufficient documentation

## 2021-04-09 DIAGNOSIS — Z9889 Other specified postprocedural states: Secondary | ICD-10-CM | POA: Insufficient documentation

## 2021-04-09 DIAGNOSIS — M47817 Spondylosis without myelopathy or radiculopathy, lumbosacral region: Secondary | ICD-10-CM | POA: Diagnosis not present

## 2021-04-09 DIAGNOSIS — M47816 Spondylosis without myelopathy or radiculopathy, lumbar region: Secondary | ICD-10-CM | POA: Diagnosis not present

## 2021-04-09 DIAGNOSIS — M961 Postlaminectomy syndrome, not elsewhere classified: Secondary | ICD-10-CM | POA: Diagnosis not present

## 2021-04-09 DIAGNOSIS — M25551 Pain in right hip: Secondary | ICD-10-CM | POA: Insufficient documentation

## 2021-04-09 DIAGNOSIS — M47898 Other spondylosis, sacral and sacrococcygeal region: Secondary | ICD-10-CM | POA: Diagnosis not present

## 2021-04-09 DIAGNOSIS — M9904 Segmental and somatic dysfunction of sacral region: Secondary | ICD-10-CM | POA: Diagnosis not present

## 2021-04-09 DIAGNOSIS — G8929 Other chronic pain: Secondary | ICD-10-CM | POA: Insufficient documentation

## 2021-04-09 DIAGNOSIS — Z79899 Other long term (current) drug therapy: Secondary | ICD-10-CM | POA: Diagnosis present

## 2021-04-09 DIAGNOSIS — G96198 Other disorders of meninges, not elsewhere classified: Secondary | ICD-10-CM | POA: Diagnosis not present

## 2021-04-09 DIAGNOSIS — M4807 Spinal stenosis, lumbosacral region: Secondary | ICD-10-CM | POA: Diagnosis not present

## 2021-04-09 DIAGNOSIS — Z79891 Long term (current) use of opiate analgesic: Secondary | ICD-10-CM | POA: Diagnosis present

## 2021-04-09 MED ORDER — HYDROCODONE-ACETAMINOPHEN 5-325 MG PO TABS
1.0000 | ORAL_TABLET | Freq: Four times a day (QID) | ORAL | 0 refills | Status: DC | PRN
  Filled 2021-04-09 – 2021-04-14 (×2): qty 100, 25d supply, fill #0

## 2021-04-09 NOTE — Progress Notes (Signed)
PROVIDER NOTE: Information contained herein reflects review and annotations entered in association with encounter. Interpretation of such information and data should be left to medically-trained personnel. Information provided to patient can be located elsewhere in the medical record under "Patient Instructions". Document created using STT-dictation technology, any transcriptional errors that may result from process are unintentional.    Patient: Tammy Deleon  Service Category: E/M  Provider: Gaspar Cola, MD  DOB: Sep 25, 1986  DOS: 04/09/2021  Specialty: Interventional Pain Management  MRN: [id number]  Setting: Ambulatory outpatient  PCP: Adin Hector, MD  Type: Established Patient    Referring Provider: Adin Hector, MD  Location: Office  Delivery: Face-to-face     Primary Reason(s) for Visit: Encounter for evaluation before starting new chronic pain management plan of care (Level of risk: moderate) CC: Back Pain (Right side)  HPI  Tammy Deleon is a 35 y.o. year old, female patient, who comes today for a follow-up evaluation to review the test results and decide on a treatment plan. She has Chronic constipation; Gestational hypertension w/o significant proteinuria in 3rd trimester; Anxiety state; B12 deficiency; Headache disorder; Loss of memory; Lumbar disc disease; Ulcerative colitis ([place]); Chronic pain syndrome; Pharmacologic therapy; Disorder of skeletal system; Problems influencing health status; Failed back surgical syndrome; Chronic low back pain (1ry area of Pain) (Right) w/o sciatica; Epidural fibrosis (S1) (Right); Lumbar facet joint syndrome (Right); Chronic hip pain (Right); Abnormal MRI, lumbar spine (02/04/2021); Lumbosacral facet arthropathy (L5-S1); Lumbosacral lateral recess stenosis (L5-S1) (Bilateral); History of lumbar laminectomy x2; Chronic use of opiate for therapeutic purpose; Chronic sacroiliac joint pain (Right); Somatic dysfunction of sacroiliac joint  (Right); and Other spondylosis, sacral and sacrococcygeal region on their problem list. Her primarily concern today is the Back Pain (Right side)  Pain Assessment: Location: Right Back Radiating: Denies Onset: More than a month ago Duration: Chronic pain Quality: Aching,Burning,Constant,Throbbing,Stabbing,Shooting Severity: 6 /10 (subjective, self-reported pain score)  Effect on ADL: limits my daily activities Timing: Constant Modifying factors: heating pad and meds BP: (!) 140/95  HR: 100  Tammy Deleon comes in today for a follow-up visit after her initial evaluation on 03/13/2021. Today we went over the results of her tests. These were explained in "Layman's terms". During today's appointment we went over my diagnostic impression, as well as the proposed treatment plan.  Review of initial evaluation: According to the patient and the primary area of pain is that of the lower back (Right).  She indicates having had 2 back surgeries by Dr. Lacinda Axon.  According to the patient, the reason for the first surgery was right lower extremity pain, numbness, and weakness as well as low back pain.  However, she indicated that the symptoms on the right lower extremity were significantly worse than those of the right lower back.  After the surgery which took place around March 2021 she was completely pain-free of the lower extremity pain and the low back pain for a while until the pain began to return slowly, but only in the right side of the lower back.  She does admit continuing to have some numbness in the right lower extremity, but that problem was gone with the surgery.  By the end of June/July 2021 the pain in the right side of the lower back had returned.  The patient then underwent a second back surgery, this time for the pain in the right lower back, which took place around January 2022.  After that surgery, the pain continued and did  not improve with the surgery.  According to the patient she is being sent  to [place] by Dr. Lacinda Axon for evaluation and management of the pain as a consequence of "scar tissue".  The patient indicates that the only nerve block that she had was a steroid injection which was done at the [facility] before the surgery and it did not improve the pain, therefore leading to the surgery.  She does admit to having had physical therapy for this pain as well as having had a recent MRI after her second surgery.  Interventional treatments by Dr. Marland Kitchen Chasnis: 1. A Right L5-S1 transforaminal epidural injection (12/07/2019) under fluoroscopic guidance. 2. A Right S1 transforaminal epidural injection (12/07/2019) under fluoroscopic guidance.  In addition to the above, the patient indicates having occasional right lower extremity pain, which has occurred only twice since March.  She describes that the pain goes down to her knee On the right leg through the front and the back of the leg.  However this pain is intermittent and its not constant.  She does have numbness and that area which does seem to be constant.  Physical exam  on the initial evaluation was negative for straight leg raise, bilaterally but positive for exact reproduction of the patient's right sided low back pain with hyperextension of the lumbar spine as well as hyperextension on rotation towards the left side which triggered contralateral pain in the right lower back.  Patrick maneuver was positive on the right side for right hip arthralgia, but no pain coming from the sacroiliac joint.  The patient was able to toe walk and heel walk without any problems.  Physical exam today again was negative on straight leg raise, bilaterally.  Hyperextension on rotation did not reproduce her pain when she rotated towards the left side but the pain was triggered contralaterally on the right side.  Patrick maneuver was positive for right sided sacroiliac joint arthralgia when testing the right side.  Testing the left side did cause some  discomfort on the right side which went away once we stabilized the pelvic joint.  Today's exam with suggest the pain to be coming from the right sacroiliac joint with some contribution from the facet joint on the right side.  In considering the treatment plan options, Ms. Crandle was reminded that I no longer take patients for medication management only. I asked her to let me know if she had no intention of taking advantage of the interventional therapies, so that we could make arrangements to provide this space to someone interested. I also made it clear that undergoing interventional therapies for the purpose of getting pain medications is very inappropriate on the part of a patient, and it will not be tolerated in this practice. This type of behavior would suggest true addiction and therefore it requires referral to an addiction specialist.   Further details on both, my assessment(s), as well as the proposed treatment plan, please see below.  Controlled Substance Pharmacotherapy Assessment REMS (Risk Evaluation and Mitigation Strategy)  Analgesic: Hydrocodone/APAP 5/325, 1 tab p.o. 4 times daily (#56) (filled 02/27/2021) MME/day: 20 mg/day  Pill Count: None expected due to no prior prescriptions written by our practice. Chauncey Fischer, RN  04/09/2021 12:51 PM  Sign when Signing Visit Safety precautions to be maintained throughout the outpatient stay will include: orient to surroundings, keep bed in low position, maintain call bell within reach at all times, provide assistance with transfer out of bed and ambulation.  Pharmacokinetics: Liberation and absorption (onset of action): WNL Distribution (time to peak effect): WNL Metabolism and excretion (duration of action): WNL         Pharmacodynamics: Desired effects: Analgesia: Ms. Levitan reports >50% benefit. Functional ability: Patient reports that medication allows her to accomplish basic ADLs Clinically meaningful improvement in  function (CMIF): Sustained CMIF goals met Perceived effectiveness: Described as relatively effective, allowing for increase in activities of daily living (ADL) Undesirable effects: Side-effects or Adverse reactions: None reported Monitoring: [place] PMP: PDMP reviewed during this encounter. Online review of the past 34-monthperiod previously conducted. Not applicable at this point since we have not taken over the patient's medication management yet. List of other Serum/Urine Drug Screening Test(s):  No results found for: AMPHSCRSER, BARBSCRSER, BENZOSCRSER, COCAINSCRSER, COCAINSCRNUR, PCPSCRSER, THCSCRSER, THCU, CANNABQUANT, OPIATESCRSER, OXYSCRSER, PBlanco ELincolnList of all UDS test(s) done:  Lab Results  Component Value Date   SUMMARY Note 03/13/2021   Last UDS on record: Summary  Date Value Ref Range Status  03/13/2021 Note  Final    Comment:    ==================================================================== Compliance Drug Analysis, Ur ==================================================================== Test                             Result       Flag       Units  Drug Present and Declared for Prescription Verification   Hydrocodone                    598          EXPECTED   ng/mg creat   Hydromorphone                  38           EXPECTED   ng/mg creat   Dihydrocodeine                 139          EXPECTED   ng/mg creat   Norhydrocodone                 504          EXPECTED   ng/mg creat    Sources of hydrocodone include scheduled prescription medications.    Hydromorphone, dihydrocodeine and norhydrocodone are expected    metabolites of hydrocodone. Hydromorphone and dihydrocodeine are    also available as scheduled prescription medications.    Lamotrigine                    PRESENT      EXPECTED   Trazodone                      PRESENT      EXPECTED   1,3 chlorophenyl piperazine    PRESENT      EXPECTED    1,3-chlorophenyl piperazine is an expected metabolite of  trazodone.    Acetaminophen                  PRESENT      EXPECTED   Ibuprofen                      PRESENT      EXPECTED  Drug Present not Declared for Prescription Verification   Tramadol                       >  3759        UNEXPECTED ng/mg creat   O-Desmethyltramadol            >3759        UNEXPECTED ng/mg creat   N-Desmethyltramadol            1017         UNEXPECTED ng/mg creat    Source of tramadol is a prescription medication. O-desmethyltramadol    and N-desmethyltramadol are expected metabolites of tramadol.    Naproxen                       PRESENT      UNEXPECTED  Drug Absent but Declared for Prescription Verification   Amphetamine                    Not Detected UNEXPECTED ng/mg creat   Phentermine                    Not Detected UNEXPECTED ==================================================================== Test                      Result    Flag   Units      Ref Range   Creatinine              133              mg/dL      >=20 ==================================================================== Declared Medications:  The flagging and interpretation on this report are based on the  following declared medications.  Unexpected results may arise from  inaccuracies in the declared medications.   **Note: The testing scope of this panel includes these medications:   Amphetamine (Adderall)  Hydrocodone (Norco)  Lamotrigine (Lamictal)  Phentermine (Adipex)  Trazodone (Desyrel)   **Note: The testing scope of this panel does not include small to  moderate amounts of these reported medications:   Acetaminophen (Norco)  Ibuprofen (Advil)   **Note: The testing scope of this panel does not include the  following reported medications:   Ethinyl Estradiol  Levonorgestrel  Valacyclovir (Valtrex) ==================================================================== For clinical consultation, please call [phone]. ====================================================================    UDS interpretation: No unexpected findings.          Medication Assessment Form: Patient introduced to form today Treatment compliance: Treatment may start today if patient agrees with proposed plan. Evaluation of compliance is not applicable at this point Risk Assessment Profile: Aberrant behavior: See initial evaluations. None observed or detected today Comorbid factors increasing risk of overdose: See initial evaluation. No additional risks detected today Opioid risk tool (ORT):  Opioid Risk  03/13/2021  Alcohol 0  Illegal Drugs 0  Rx Drugs 0  Alcohol 0  Illegal Drugs 0  Rx Drugs 0  Age between 16-45 years  1  Psychological Disease 0  Depression 0  Opioid Risk Tool Scoring 1  Opioid Risk Interpretation Low Risk    ORT Scoring interpretation table:  Score <3 = Low Risk for SUD  Score between 4-7 = Moderate Risk for SUD  Score >8 = High Risk for Opioid Abuse   Risk of substance use disorder (SUD): Low  Risk Mitigation Strategies:  Patient opioid safety counseling: Completed today. Counseling provided to patient as per "Patient Counseling Document". Document signed by patient, attesting to counseling and understanding Patient-Prescriber Agreement (PPA): Obtained today.  Controlled substance notification to other providers: Written and sent today.  Pharmacologic Plan: Today we may be taking over  the patient's pharmacological regimen. See below.             Laboratory Chemistry Profile   Renal Lab Results  Component Value Date   BUN 15 03/13/2021   CREATININE 0.89 03/13/2021   LABCREA 208 09/21/2019   BCR 17 03/13/2021   GFRAA >60 12/30/2019   GFRNONAA >60 10/29/2020   SPECGRAV 1.015 01/11/2016   PHUR 7.5 01/11/2016   PROTEINUR NEGATIVE 10/29/2020     Electrolytes Lab Results  Component Value Date   NA 139 03/13/2021   K 4.7 03/13/2021   CL 103 03/13/2021   CALCIUM 9.9 03/13/2021    MG 1.9 03/13/2021     Hepatic Lab Results  Component Value Date   AST 18 03/13/2021   ALT 11 09/22/2019   ALBUMIN 4.4 03/13/2021   ALKPHOS 67 03/13/2021   LIPASE 80 03/17/2012     ID Lab Results  Component Value Date   HIV Non-reactive 09/08/2019   SARSCOV2NAA NEGATIVE 11/01/2020   STAPHAUREUS NEGATIVE 10/29/2020   MRSAPCR NEGATIVE 10/29/2020   PREGTESTUR NEGATIVE 11/05/2020     Bone Lab Results  Component Value Date   25OHVITD1 50 03/13/2021   25OHVITD2 <1.0 03/13/2021   25OHVITD3 50 03/13/2021     Endocrine Lab Results  Component Value Date   GLUCOSE 85 03/13/2021   GLUCOSEU NEGATIVE 10/29/2020   TSH 1.21 09/29/2012     Neuropathy Lab Results  Component Value Date   VITAMINB12 345 03/13/2021   HIV Non-reactive 09/08/2019     CNS No results found for: COLORCSF, APPEARCSF, RBCCOUNTCSF, WBCCSF, POLYSCSF, LYMPHSCSF, EOSCSF, PROTEINCSF, GLUCCSF, JCVIRUS, CSFOLI, IGGCSF, LABACHR, ACETBL, LABACHR, ACETBL   Inflammation (CRP: Acute  ESR: Chronic) Lab Results  Component Value Date   CRP 9 03/13/2021   ESRSEDRATE 14 03/13/2021     Rheumatology No results found for: RF, ANA, LABURIC, URICUR, LYMEIGGIGMAB, LYMEABIGMQN, HLAB27   Coagulation Lab Results  Component Value Date   INR 0.9 10/29/2020   LABPROT 12.1 10/29/2020   APTT 27 10/29/2020   PLT 349 10/29/2020     Cardiovascular Lab Results  Component Value Date   HGB 13.3 10/29/2020   HCT 39.1 10/29/2020     Screening Lab Results  Component Value Date   SARSCOV2NAA NEGATIVE 11/01/2020   STAPHAUREUS NEGATIVE 10/29/2020   MRSAPCR NEGATIVE 10/29/2020   HIV Non-reactive 09/08/2019   PREGTESTUR NEGATIVE 11/05/2020     Cancer No results found for: CEA, CA125, LABCA2   Allergens No results found for: ALMOND, APPLE, ASPARAGUS, AVOCADO, BANANA, BARLEY, BASIL, BAYLEAF, GREENBEAN, LIMABEAN, WHITEBEAN, BEEFIGE, REDBEET, BLUEBERRY, BROCCOLI, CABBAGE, MELON, CARROT, CASEIN, CASHEWNUT, CAULIFLOWER,  CELERY     Note: Lab results reviewed.  Recent Diagnostic Imaging Review  Lumbosacral Imaging: Lumbar MR wo contrast: Results for orders placed during the hospital encounter of 12/15/19 MR LUMBAR SPINE WO CONTRAST  Narrative CLINICAL DATA:  Right side low back pain with numbness and pain in the right leg since 09/23/2019. No known injury.  EXAM: MRI LUMBAR SPINE WITHOUT CONTRAST  TECHNIQUE: Multiplanar, multisequence MR imaging of the lumbar spine was performed. No intravenous contrast was administered.  COMPARISON:  None.  FINDINGS: Segmentation:  Standard.  Alignment:  Normal.  Vertebrae:  No fracture, evidence of discitis, or bone lesion.  Conus medullaris and cauda equina: Conus extends to the T12 level. Conus and cauda equina appear normal.  Paraspinal and other soft tissues: Negative.  Disc levels:  The T11-12 to L4-5 levels are negative.  L5-S1: There is a large right paracentral  and subarticular recess disc protrusion with a superimposed extruded disc fragment extending cephalad in the subarticular recess. The extruded fragment measures approximately 1 cm AP by 1 cm transverse by 1 cm craniocaudal. The protrusion and extruded fragment deform and narrow the thecal sac and impinge on the descending right L5 and S1 roots. The foramina are open.  IMPRESSION:  Large right paracentral and subarticular disc protrusion with a superimposed cephalad extending extruded disc fragment in the subarticular recess at L5-S1 result in impingement on the descending right L5 and S1 roots and marked narrowing of the thecal sac.   Electronically Signed By: Inge Rise M.D. On: 12/15/2019 13:11  Lumbar MR w/wo contrast: Results for orders placed during the hospital encounter of 02/04/21 MR Lumbar Spine W Wo Contrast  Narrative CLINICAL DATA:  Acute low back pain with right-sided sciatica. Back surgery 11/05/2020  EXAM: MRI LUMBAR SPINE WITHOUT AND WITH  CONTRAST  TECHNIQUE: Multiplanar and multiecho pulse sequences of the lumbar spine were obtained without and with intravenous contrast.  CONTRAST:  63m GADAVIST GADOBUTROL 1 MMOL/ML IV SOLN  COMPARISON:  Lumbar MRI 11/26/2019  FINDINGS: Segmentation:  Normal  Alignment:  Normal  Vertebrae: Negative for fracture or mass. Mild discogenic bone marrow edema and enhancement has progressed since the prior study related to degenerative change and discectomy.  Conus medullaris and cauda equina: Conus extends to the T12 level. Conus and cauda equina appear normal.  Paraspinal and other soft tissues: Negative for paraspinous mass, adenopathy, fluid collection.  Disc levels:  L1-2: Negative  L2-3: Negative  L3-4: Negative  L4-5: Negative  L5-S1: Interval right laminectomy and resection of large extruded disc fragment on the right. Small disc protrusion is present on the right. Epidural enhancement is seen around the right S1 nerve root. There is mild facet degeneration. Disc space narrowing and endplate spurring are present with mild subarticular stenosis bilaterally.  IMPRESSION: Postop laminectomy discectomy on the right at L5-S1. There is marked interval improved. Small right-sided disc protrusion with surrounding epidural scar formation is present. Right S1 nerve root appears adequately decompressed.   Electronically Signed By: CFranchot GalloM.D. On: 02/04/2021 13:50  Results for orders placed during the hospital encounter of 09/25/20 MR Lumbar Spine W Wo Contrast  Narrative CLINICAL DATA:  Right leg and low back pain, prior surgery  EXAM: MRI LUMBAR SPINE WITHOUT AND WITH CONTRAST  TECHNIQUE: Multiplanar and multiecho pulse sequences of the lumbar spine were obtained without and with intravenous contrast.  CONTRAST:  655mGADAVIST GADOBUTROL 1 MMOL/ML IV SOLN  COMPARISON:  12/15/2019  FINDINGS: Segmentation:  Standard.  Alignment:   Stable  Vertebrae: The stable vertebral body heights. Degenerative endplate marrow edema at L5-S1. No suspicious osseous lesion.  Conus medullaris and cauda equina: Conus extends to the T12 level. Conus and cauda equina appear normal.  Paraspinal and other soft tissues: Lower lumbar postoperative change. Otherwise unremarkable.  Disc levels: Intervertebral disc heights and signal are maintained from L1-L2 through L4-L5. Mild facet arthropathy at L4-L5. There is no canal or foraminal stenosis at these levels.  L5-S1: Interval right laminotomy. There is persistent tissue in the ventral epidural space and right subarticular recess. This partially enhances and likely represents a combination of residual/recurrent disc herniation and postoperative tissue. There is moderate to marked effacement of the thecal sac and effacement of the right subarticular recess with compression of the traversing S1 nerve root. Unchanged right foraminal protrusion with endplate osteophytic ridging and bilateral facet arthropathy. Unchanged moderate right foraminal  stenosis. No left foraminal stenosis.  IMPRESSION: New postoperative changes at L5-S1. Combination of residual/recurrent disc herniation and postoperative tissue resulting in moderate to marked effacement of the thecal sac and effacement of the right subarticular recess with S1 nerve root compression. Unchanged right foraminal protrusion also at L5-S1 causing moderate stenosis.   Electronically Signed By: Macy Mis M.D. On: 09/25/2020 21:30  Lumbar DG 2-3 views: Results for orders placed during the hospital encounter of 11/05/20 DG Lumbar Spine 2-3 Views  Narrative CLINICAL DATA:  Surgery  EXAM: LUMBAR SPINE - 2-3 VIEW; DG C-ARM 1-60 MIN  COMPARISON:  MRI of the lumbar spine September 25, 2020.  FINDINGS: Fluoro time: 12 seconds.  Four C-arm fluoroscopic images were obtained intraoperatively and submitted for post operative  interpretation. These images demonstrate surgical probe at the L5-S1 disc space posteriorly. Please see the performing provider's procedural report for further detail.  IMPRESSION: Intraoperative fluoroscopic imaging, as detailed above.   Electronically Signed By: Margaretha Sheffield MD On: 11/05/2020 09:47  Lumbar DG Bending views: Results for orders placed during the hospital encounter of 03/14/21 DG Lumbar Spine Complete W/Bend  Narrative CLINICAL DATA:  Chronic low back pain radiating to the right hip. No injury.  EXAM: LUMBAR SPINE - COMPLETE WITH BENDING VIEWS  COMPARISON:  Lumbar MRI, 02/04/2021.  Radiographs, 11/05/2020.  FINDINGS: No fracture, bone lesion or spondylolisthesis.  Marked loss of disc height at L5-S1. Remaining lumbar discs are well preserved. Mild narrowing of the left L2-L3 facet joint. Remaining facet joints are well preserved.  No subluxation with flexion or extension.  Soft tissues are unremarkable.  IMPRESSION: 1. No fracture or acute finding. No spondylolisthesis and no subluxation with flexion or extension. 2. Disc degenerative change with marked loss of disc height at L5-S1 similar to the recent prior lumbar MRI. No other abnormalities.   Electronically Signed By: Lajean Manes M.D. On: 03/15/2021 15:32        Hip Imaging: Hip-R DG 2-3 views: Results for orders placed during the hospital encounter of 03/14/21 DG HIP UNILAT W OR W/O PELVIS 2-3 VIEWS RIGHT  Narrative CLINICAL DATA:  Chronic low back pain radiating to the right hip. No injury.  EXAM: DG HIP (WITH OR WITHOUT PELVIS) 2-3V RIGHT  COMPARISON:  None.  FINDINGS: No fracture, no fracture or bone lesion.  Hip joints, SI joints and symphysis pubis are normally spaced and aligned. No degenerative/arthropathic change.  Normal soft tissues.  IMPRESSION: Negative.   Electronically Signed By: Lajean Manes M.D. On: 03/15/2021 15:33  Complexity Note: Imaging  results reviewed. Results shared with Ms. Hardin Negus, using [employer].                        Meds   Current Outpatient Medications:  .  amphetamine-dextroamphetamine (ADDERALL) 30 MG tablet, TAKE 1 TABLET BY MOUTH 2 (TWO) TIMES DAILY, Disp: 180 tablet, Rfl: 0 .  HYDROcodone-acetaminophen (NORCO/VICODIN) 5-325 MG tablet, Take 1 tablet by mouth every 6 (six) hours as needed for severe pain. Must last 30 days., Disp: 100 tablet, Rfl: 0 .  ibuprofen (ADVIL) 800 MG tablet, TAKE 1 TABLET BY MOUTH EVERY 8 HOURS AS NEEDED FOR PAIN, Disp: 30 tablet, Rfl: 11 .  lamoTRIgine (LAMICTAL) 200 MG tablet, Take 200 mg by mouth daily., Disp: , Rfl:  .  Levonorgestrel-Ethinyl Estradiol (SIMPESSE) 0.15-0.03 &0.01 MG tablet, Take 1 tablet by mouth daily., Disp: 91 tablet, Rfl: 0 .  traZODone (DESYREL) 100 MG tablet, Take 100  mg by mouth at bedtime., Disp: , Rfl:  .  traZODone (DESYREL) 100 MG tablet, TAKE 1 TABLET (100 MG TOTAL) BY MOUTH NIGHTLY, Disp: 90 tablet, Rfl: 3 .  valACYclovir (VALTREX) 500 MG tablet, TAKE 1 TABLET BY MOUTH TWICE DAILY (Patient taking differently: Take 500 mg by mouth 2 (two) times daily as needed.), Disp: 12 tablet, Rfl: 4 .  phentermine (ADIPEX-P) 37.5 MG tablet, TAKE 1 TABLET (37.5 MG TOTAL) BY MOUTH EVERY MORNING BEFORE BREAKFAST, Disp: 30 tablet, Rfl: 0  ROS  Constitutional: Denies any fever or chills Gastrointestinal: No reported hemesis, hematochezia, vomiting, or acute GI distress Musculoskeletal: Denies any acute onset joint swelling, redness, loss of ROM, or weakness Neurological: No reported episodes of acute onset apraxia, aphasia, dysarthria, agnosia, amnesia, paralysis, loss of coordination, or loss of consciousness  Allergies  Ms. Colquhoun is allergic to sulfa antibiotics.  [place]  Drug: Ms. Rightmyer  reports no history of drug use. Alcohol:  reports current alcohol use. Tobacco:  reports that she has never smoked. She has never used smokeless tobacco. Medical:  has a  past medical history of Anxiety, Depression, Irregular intermenstrual bleeding, and UTI (lower urinary tract infection). Surgical: Ms. Sweeting  has a past surgical history that includes Gallbladder surgery; Hemi-microdiscectomy lumbar laminectomy level 1 (Right, 01/02/2020); and Hemi-microdiscectomy lumbar laminectomy level 1 (Right, 11/05/2020). Family: family history is not on file.  Constitutional Exam  General appearance: Well nourished, well developed, and well hydrated. In no apparent acute distress Vitals:   04/09/21 1251  BP: (!) 140/95  Pulse: 100  Temp: 98.2 F (36.8 C)  SpO2: 100%  Weight: 162 lb (73.5 kg)  Height: 5' 2"  (1.575 m)   BMI Assessment: Estimated body mass index is 29.63 kg/m as calculated from the following:   Height as of this encounter: 5' 2"  (1.575 m).   Weight as of this encounter: 162 lb (73.5 kg).  BMI interpretation table: BMI level Category Range association with higher incidence of chronic pain  <18 kg/m2 Underweight   18.5-24.9 kg/m2 Ideal body weight   25-29.9 kg/m2 Overweight Increased incidence by 20%  30-34.9 kg/m2 Obese (Class I) Increased incidence by 68%  35-39.9 kg/m2 Severe obesity (Class II) Increased incidence by 136%  >40 kg/m2 Extreme obesity (Class III) Increased incidence by 254%   Patient's current BMI Ideal Body weight  Body mass index is 29.63 kg/m. Ideal body weight: 50.1 kg (110 lb 7.2 oz) Adjusted ideal body weight: 59.5 kg (131 lb 1.1 oz)   BMI Readings from Last 4 Encounters:  04/09/21 29.63 kg/m  03/13/21 30.14 kg/m  10/29/20 28.84 kg/m  12/29/19 27.98 kg/m   Wt Readings from Last 4 Encounters:  04/09/21 162 lb (73.5 kg)  03/13/21 164 lb 12.8 oz (74.8 kg)  10/29/20 157 lb 11.2 oz (71.5 kg)  12/29/19 153 lb (69.4 kg)    Psych/Mental status: Alert, oriented x 3 (person, place, & time)       Eyes: PERLA Respiratory: No evidence of acute respiratory distress  Assessment & Plan  Primary Diagnosis & Pertinent  Problem List: The primary encounter diagnosis was Chronic low back pain (1ry area of Pain) (Right) w/o sciatica. Diagnoses of Failed back surgical syndrome, Epidural fibrosis (S1) (Right), Abnormal MRI, lumbar spine (02/04/2021), Lumbar facet joint syndrome (Right), Lumbosacral facet arthropathy (L5-S1), Lumbosacral lateral recess stenosis (L5-S1) (Bilateral), Chronic hip pain (Right), Chronic pain syndrome, History of lumbar laminectomy x2, Pharmacologic therapy, Chronic use of opiate for therapeutic purpose, Chronic sacroiliac joint pain (Right), Somatic  dysfunction of sacroiliac joint (Right), and Other spondylosis, sacral and sacrococcygeal region were also pertinent to this visit.  Visit Diagnosis: 1. Chronic low back pain (1ry area of Pain) (Right) w/o sciatica   2. Failed back surgical syndrome   3. Epidural fibrosis (S1) (Right)   4. Abnormal MRI, lumbar spine (02/04/2021)   5. Lumbar facet joint syndrome (Right)   6. Lumbosacral facet arthropathy (L5-S1)   7. Lumbosacral lateral recess stenosis (L5-S1) (Bilateral)   8. Chronic hip pain (Right)   9. Chronic pain syndrome   10. History of lumbar laminectomy x2   11. Pharmacologic therapy   12. Chronic use of opiate for therapeutic purpose   13. Chronic sacroiliac joint pain (Right)   14. Somatic dysfunction of sacroiliac joint (Right)   15. Other spondylosis, sacral and sacrococcygeal region    Problems updated and reviewed during this visit: Problem  History of lumbar laminectomy x2   L5-S1 right hemilaminectomy 01/02/2020 and 11/05/2020 by Dr. [name]   Chronic sacroiliac joint pain (Right)  Somatic dysfunction of sacroiliac joint (Right)  Other Spondylosis, Sacral and Sacrococcygeal Region  Chronic Use of Opiate for Therapeutic Purpose    Plan of Care  Pharmacotherapy (Medications Ordered): Meds ordered this encounter  Medications  . HYDROcodone-acetaminophen (NORCO/VICODIN) 5-325 MG tablet    Sig: Take 1 tablet by mouth  every 6 (six) hours as needed for severe pain. Must last 30 days.    Dispense:  100 tablet    Refill:  0    Chronic Pain: STOP Act (Not applicable) Fill 1 day early if closed on refill date. Avoid benzodiazepines within 8 hours of opioids    Procedure Orders     SACROILIAC JOINT INJECTION Lab Orders  No laboratory test(s) ordered today   Imaging Orders  No imaging studies ordered today   Referral Orders  No referral(s) requested today    Pharmacological management options:  Opioid Analgesics: We'll take over management today. See above orders Membrane stabilizer: Options discussed, including a trial. Muscle relaxant: We have discussed the possibility of a trial NSAID: Trial discussed. Other analgesic(s): To be determined at a later time     Interventional Therapies  Risk  Complexity Considerations:   Estimated body mass index is 29.63 kg/m as calculated from the following:   Height as of this encounter: 5' 2"  (1.575 m).   Weight as of this encounter: 162 lb (73.5 kg). WNL   Planned  Pending:   Diagnostic right sacroiliac joint block #1    Under consideration:   Diagnostic right sacroiliac joint block  Diagnostic right caudal ESI #1 + diagnostic epidurogram  Possible candidate for right-sided Racz procedure  Diagnostic right lumbar facet block  Possible candidate for right thoracolumbar spinal cord stimulator trial/implant    Completed:   None at this time   Therapeutic  Palliative (PRN) options:   None established    Provider-requested follow-up: Return for Procedure (w/ sedation): (R) SI BLK #1. Recent Visits Date Type Provider Dept  03/13/21 Office Visit Milinda Pointer, MD [facility]  Showing recent visits within past 90 days and meeting all other requirements Today's Visits Date Type Provider Dept  04/09/21 Office Visit Milinda Pointer, MD [facility]  Showing today's visits and meeting all other requirements Future  Appointments No visits were found meeting these conditions. Showing future appointments within next 90 days and meeting all other requirements  Primary Care Physician: Adin Hector, MD Note by: Gaspar Cola, MD Date:  04/09/2021; Time: 2:05 PM

## 2021-04-09 NOTE — Patient Instructions (Addendum)
____________________________________________________________________________________________  Preparing for Procedure with Sedation  Procedure appointments are limited to planned procedures: . No Prescription Refills. . No disability issues will be discussed. . No medication changes will be discussed.  Instructions: . Oral Intake: Do not eat or drink anything for at least 8 hours prior to your procedure. (Exception: Blood Pressure Medication. See below.) . Transportation: Unless otherwise stated by your physician, you may drive yourself after the procedure. . Blood Pressure Medicine: Do not forget to take your blood pressure medicine with a sip of water the morning of the procedure. If your Diastolic (lower reading)is above 100 mmHg, elective cases will be cancelled/rescheduled. . Blood thinners: These will need to be stopped for procedures. Notify our staff if you are taking any blood thinners. Depending on which one you take, there will be specific instructions on how and when to stop it. . Diabetics on insulin: Notify the staff so that you can be scheduled 1st case in the morning. If your diabetes requires high dose insulin, take only  of your normal insulin dose the morning of the procedure and notify the staff that you have done so. . Preventing infections: Shower with an antibacterial soap the morning of your procedure. . Build-up your immune system: Take 1000 mg of Vitamin C with every meal (3 times a day) the day prior to your procedure. Marland Kitchen Antibiotics: Inform the staff if you have a condition or reason that requires you to take antibiotics before dental procedures. . Pregnancy: If you are pregnant, call and cancel the procedure. . Sickness: If you have a cold, fever, or any active infections, call and cancel the procedure. . Arrival: You must be in the facility at least 30 minutes prior to your scheduled procedure. . Children: Do not bring children with you. . Dress appropriately:  Bring dark clothing that you would not mind if they get stained. . Valuables: Do not bring any jewelry or valuables.  Reasons to call and reschedule or cancel your procedure: (Following these recommendations will minimize the risk of a serious complication.) . Surgeries: Avoid having procedures within 2 weeks of any surgery. (Avoid for 2 weeks before or after any surgery). . Flu Shots: Avoid having procedures within 2 weeks of a flu shots or . (Avoid for 2 weeks before or after immunizations). . Barium: Avoid having a procedure within 7-10 days after having had a radiological study involving the use of radiological contrast. (Myelograms, Barium swallow or enema study). . Heart attacks: Avoid any elective procedures or surgeries for the initial 6 months after a "Myocardial Infarction" (Heart Attack). . Blood thinners: It is imperative that you stop these medications before procedures. Let us know if you if you take any blood thinner.  . Infection: Avoid procedures during or within two weeks of an infection (including chest colds or gastrointestinal problems). Symptoms associated with infections include: Localized redness, fever, chills, night sweats or profuse sweating, burning sensation when voiding, cough, congestion, stuffiness, runny nose, sore throat, diarrhea, nausea, vomiting, cold or Flu symptoms, recent or current infections. It is specially important if the infection is over the area that we intend to treat. Marland Kitchen Heart and lung problems: Symptoms that may suggest an active cardiopulmonary problem include: cough, chest pain, breathing difficulties or shortness of breath, dizziness, ankle swelling, uncontrolled high or unusually low blood pressure, and/or palpitations. If you are experiencing any of these symptoms, cancel your procedure and contact your primary care physician for an evaluation.  Remember:  Regular Business hours are:  Monday to Thursday 8:00 AM to 4:00 PM  Provider's  Schedule: Milinda Pointer, MD:  Procedure days: Tuesday and Thursday 7:30 AM to 4:00 PM  Gillis Santa, MD:  Procedure days: Monday and Wednesday 7:30 AM to 4:00 PM ____________________________________________________________________________________________   ____________________________________________________________________________________________  General Risks and Possible Complications  Patient Responsibilities: It is important that you read this as it is part of your informed consent. It is our duty to inform you of the risks and possible complications associated with treatments offered to you. It is your responsibility as a patient to read this and to ask questions about anything that is not clear or that you believe was not covered in this document.  Patient's Rights: You have the right to refuse treatment. You also have the right to change your mind, even after initially having agreed to have the treatment done. However, under this last option, if you wait until the last second to change your mind, you may be charged for the materials used up to that point.  Introduction: Medicine is not an Chief Strategy Officer. Everything in Medicine, including the lack of treatment(s), carries the potential for danger, harm, or loss (which is by definition: Risk). In Medicine, a complication is a secondary problem, condition, or disease that can aggravate an already existing one. All treatments carry the risk of possible complications. The fact that a side effects or complications occurs, does not imply that the treatment was conducted incorrectly. It must be clearly understood that these can happen even when everything is done following the highest safety standards.  No treatment: You can choose not to proceed with the proposed treatment alternative. The "PRO(s)" would include: avoiding the risk of complications associated with the therapy. The "CON(s)" would include: not getting any of the treatment  benefits. These benefits fall under one of three categories: diagnostic; therapeutic; and/or palliative. Diagnostic benefits include: getting information which can ultimately lead to improvement of the disease or symptom(s). Therapeutic benefits are those associated with the successful treatment of the disease. Finally, palliative benefits are those related to the decrease of the primary symptoms, without necessarily curing the condition (example: decreasing the pain from a flare-up of a chronic condition, such as incurable terminal cancer).  General Risks and Complications: These are associated to most interventional treatments. They can occur alone, or in combination. They fall under one of the following six (6) categories: no benefit or worsening of symptoms; bleeding; infection; nerve damage; allergic reactions; and/or death. 1. No benefits or worsening of symptoms: In Medicine there are no guarantees, only probabilities. No healthcare provider can ever guarantee that a medical treatment will work, they can only state the probability that it may. Furthermore, there is always the possibility that the condition may worsen, either directly, or indirectly, as a consequence of the treatment. 2. Bleeding: This is more common if the patient is taking a blood thinner, either prescription or over the counter (example: Goody Powders, Fish oil, Aspirin, Garlic, etc.), or if suffering a condition associated with impaired coagulation (example: Hemophilia, cirrhosis of the liver, low platelet counts, etc.). However, even if you do not have one on these, it can still happen. If you have any of these conditions, or take one of these drugs, make sure to notify your treating physician. 3. Infection: This is more common in patients with a compromised immune system, either due to disease (example: diabetes, cancer, human immunodeficiency virus [HIV], etc.), or due to medications or treatments (example: therapies used to treat  cancer and  rheumatological diseases). However, even if you do not have one on these, it can still happen. If you have any of these conditions, or take one of these drugs, make sure to notify your treating physician. 4. Nerve Damage: This is more common when the treatment is an invasive one, but it can also happen with the use of medications, such as those used in the treatment of cancer. The damage can occur to small secondary nerves, or to large primary ones, such as those in the spinal cord and brain. This damage may be temporary or permanent and it may lead to impairments that can range from temporary numbness to permanent paralysis and/or brain death. 5. Allergic Reactions: Any time a substance or material comes in contact with our body, there is the possibility of an allergic reaction. These can range from a mild skin rash (contact dermatitis) to a severe systemic reaction (anaphylactic reaction), which can result in death. 6. Death: In general, any medical intervention can result in death, most of the time due to an unforeseen complication. ____________________________________________________________________________________________  ____________________________________________________________________________________________  Medication Rules  Purpose: To inform patients, and their family members, of our rules and regulations.  Applies to: All patients receiving prescriptions (written or electronic).  Pharmacy of record: Pharmacy where electronic prescriptions will be sent. If written prescriptions are taken to a different pharmacy, please inform the nursing staff. The pharmacy listed in the electronic medical record should be the one where you would like electronic prescriptions to be sent.  Electronic prescriptions: In compliance with the [place] (STOP) Act of 2017 (Session Lanny Cramp [phone]), effective November 03, 2018, all controlled substances must  be electronically prescribed. Calling prescriptions to the pharmacy will cease to exist.  Prescription refills: Only during scheduled appointments. Applies to all prescriptions.  NOTE: The following applies primarily to controlled substances (Opioid* Pain Medications).   Type of encounter (visit): For patients receiving controlled substances, face-to-face visits are required. (Not an option or up to the patient.)  Patient's responsibilities: 1. Pain Pills: Bring all pain pills to every appointment (except for procedure appointments). 2. Pill Bottles: Bring pills in original pharmacy bottle. Always bring the newest bottle. Bring bottle, even if empty. 3. Medication refills: You are responsible for knowing and keeping track of what medications you take and those you need refilled. The day before your appointment: write a list of all prescriptions that need to be refilled. The day of the appointment: give the list to the admitting nurse. Prescriptions will be written only during appointments. No prescriptions will be written on procedure days. If you forget a medication: it will not be "Called in", "Faxed", or "electronically sent". You will need to get another appointment to get these prescribed. No early refills. Do not call asking to have your prescription filled early. 4. Prescription Accuracy: You are responsible for carefully inspecting your prescriptions before leaving our office. Have the discharge nurse carefully go over each prescription with you, before taking them home. Make sure that your name is accurately spelled, that your address is correct. Check the name and dose of your medication to make sure it is accurate. Check the number of pills, and the written instructions to make sure they are clear and accurate. Make sure that you are given enough medication to last until your next medication refill appointment. 5. Taking Medication: Take medication as prescribed. When it comes to  controlled substances, taking less pills or less frequently than prescribed is permitted and encouraged. Never  take more pills than instructed. Never take medication more frequently than prescribed.  6. Inform other Doctors: Always inform, all of your healthcare providers, of all the medications you take. 7. Pain Medication from other Providers: You are not allowed to accept any additional pain medication from any other Doctor or Healthcare provider. There are two exceptions to this rule. (see below) In the event that you require additional pain medication, you are responsible for notifying us, as stated below. 8. Cough Medicine: Often these contain an opioid, such as codeine or hydrocodone. Never accept or take cough medicine containing these opioids if you are already taking an opioid* medication. The combination may cause respiratory failure and death. 9. Medication Agreement: You are responsible for carefully reading and following our Medication Agreement. This must be signed before receiving any prescriptions from our practice. Safely store a copy of your signed Agreement. Violations to the Agreement will result in no further prescriptions. (Additional copies of our Medication Agreement are available upon request.) 10. Laws, Rules, & Regulations: All patients are expected to follow all Federal and [employer], [employer], Rules, [employer] [facility]. Ignorance of the Laws does not constitute a valid excuse.  11. Illegal drugs and Controlled Substances: The use of illegal substances (including, but not limited to marijuana and its derivatives) and/or the illegal use of any controlled substances is strictly prohibited. Violation of this rule may result in the immediate and permanent discontinuation of any and all prescriptions being written by our practice. The use of any illegal substances is prohibited. 12. Adopted CDC guidelines & recommendations: Target dosing levels will be at or below 60 MME/day. Use of  benzodiazepines** is not recommended.  Exceptions: There are only two exceptions to the rule of not receiving pain medications from other Healthcare Providers. 1. Exception #1 (Emergencies): In the event of an emergency (i.e.: accident requiring emergency care), you are allowed to receive additional pain medication. However, you are responsible for: As soon as you are able, call our office [phone], at any time of the day or night, and leave a message stating your name, the date and nature of the emergency, and the name and dose of the medication prescribed. In the event that your call is answered by a member of our staff, make sure to document and save the date, time, and the name of the person that took your information.  2. Exception #2 (Planned Surgery): In the event that you are scheduled by another doctor or dentist to have any type of surgery or procedure, you are allowed (for a period no longer than 30 days), to receive additional pain medication, for the acute post-op pain. However, in this case, you are responsible for picking up a copy of our "Post-op Pain Management for Surgeons" handout, and giving it to your surgeon or dentist. This document is available at our office, and does not require an appointment to obtain it. Simply go to our office during business hours (Monday-Thursday from 8:00 AM to 4:00 PM) (Friday 8:00 AM to 12:00 Noon) or if you have a scheduled appointment with [place], prior to your surgery, and ask for it by name. In addition, you are responsible for: calling our office [phone], at any time of the day or night, and leaving a message stating your name, name of your surgeon, type of surgery, and date of procedure or surgery. Failure to comply with your responsibilities may result in termination of therapy involving the controlled substances.  *Opioid medications include: morphine, codeine,  oxycodone, oxymorphone, hydrocodone, hydromorphone, meperidine, tramadol,  tapentadol, buprenorphine, fentanyl, methadone. **Benzodiazepine medications include: diazepam (Valium), alprazolam (Xanax), clonazepam (Klonopine), lorazepam (Ativan), clorazepate (Tranxene), chlordiazepoxide (Librium), estazolam (Prosom), oxazepam (Serax), temazepam (Restoril), triazolam (Halcion) (Last updated: 10/01/2020) ____________________________________________________________________________________________   ____________________________________________________________________________________________  Medication Recommendations and Reminders  Applies to: All patients receiving prescriptions (written and/or electronic).  Medication Rules & Regulations: These rules and regulations exist for your safety and that of others. They are not flexible and neither are we. Dismissing or ignoring them will be considered "non-compliance" with medication therapy, resulting in complete and irreversible termination of such therapy. (See document titled "Medication Rules" for more details.) In all conscience, because of safety reasons, we cannot continue providing a therapy where the patient does not follow instructions.  Pharmacy of record:   Definition: This is the pharmacy where your electronic prescriptions will be sent.   We do not endorse any particular pharmacy, however, we have experienced problems with [facility] not securing enough medication supply for the community.  We do not restrict you in your choice of pharmacy. However, once we write for your prescriptions, we will NOT be re-sending more prescriptions to fix restricted supply problems created by your pharmacy, or your insurance.   The pharmacy listed in the electronic medical record should be the one where you want electronic prescriptions to be sent.  If you choose to change pharmacy, simply notify our nursing staff.  Recommendations:  Keep all of your pain medications in a safe place, under lock and key, even if you live alone.  We will NOT replace lost, stolen, or damaged medication.  After you fill your prescription, take 1 week's worth of pills and put them away in a safe place. You should keep a separate, properly labeled bottle for this purpose. The remainder should be kept in the original bottle. Use this as your primary supply, until it runs out. Once it's gone, then you know that you have 1 week's worth of medicine, and it is time to come in for a prescription refill. If you do this correctly, it is unlikely that you will ever run out of medicine.  To make sure that the above recommendation works, it is very important that you make sure your medication refill appointments are scheduled at least 1 week before you run out of medicine. To do this in an effective manner, make sure that you do not leave the office without scheduling your next medication management appointment. Always ask the nursing staff to show you in your prescription , when your medication will be running out. Then arrange for the receptionist to get you a return appointment, at least 7 days before you run out of medicine. Do not wait until you have 1 or 2 pills left, to come in. This is very poor planning and does not take into consideration that we may need to cancel appointments due to bad weather, sickness, or emergencies affecting our staff.  DO NOT ACCEPT A "Partial Fill": If for any reason your pharmacy does not have enough pills/tablets to completely fill or refill your prescription, do not allow for a "partial fill". The law allows the pharmacy to complete that prescription within 72 hours, without requiring a new prescription. If they do not fill the rest of your prescription within those 72 hours, you will need a separate prescription to fill the remaining amount, which we will NOT provide. If the reason for the partial fill is your insurance, you will need to talk to the pharmacist  about payment alternatives for the remaining tablets, but again, DO  NOT ACCEPT A PARTIAL FILL, unless you can trust your pharmacist to obtain the remainder of the pills within 72 hours.  Prescription refills and/or changes in medication(s):   Prescription refills, and/or changes in dose or medication, will be conducted only during scheduled medication management appointments. (Applies to both, written and electronic prescriptions.)  No refills on procedure days. No medication will be changed or started on procedure days. No changes, adjustments, and/or refills will be conducted on a procedure day. Doing so will interfere with the diagnostic portion of the procedure.  No phone refills. No medications will be "called into the pharmacy".  No Fax refills.  No weekend refills.  No Holliday refills.  No after hours refills.  Remember:  Business hours are:  Monday to Thursday 8:00 AM to 4:00 PM Provider's Schedule: Milinda Pointer, MD - Appointments are:  Medication management: Monday and Wednesday 8:00 AM to 4:00 PM Procedure day: Tuesday and Thursday 7:30 AM to 4:00 PM Gillis Santa, MD - Appointments are:  Medication management: Tuesday and Thursday 8:00 AM to 4:00 PM Procedure day: Monday and Wednesday 7:30 AM to 4:00 PM (Last update: 05/23/2020) ____________________________________________________________________________________________   ____________________________________________________________________________________________  CBD (cannabidiol) WARNING  Applicable to: All individuals currently taking or considering taking CBD (cannabidiol) and, more important, all patients taking opioid analgesic controlled substances (pain medication). (Example: oxycodone; oxymorphone; hydrocodone; hydromorphone; morphine; methadone; tramadol; tapentadol; fentanyl; buprenorphine; butorphanol; dextromethorphan; meperidine; codeine; etc.)  Legal status: CBD remains a Schedule I drug prohibited for any use. CBD is illegal with one exception. In the [place], CBD has a limited Transport planner (FDA) approval for the treatment of two specific types of epilepsy disorders. Only one CBD product has been approved by the FDA for this purpose: "Epidiolex". FDA is aware that some companies are marketing products containing cannabis and cannabis-derived compounds in ways that violate the [employer], Drug and Cosmetic Act [facility] Act) and that may put the health and safety of consumers at risk. The FDA, a Federal agency, has not enforced the CBD status since 2018.   Legality: Some manufacturers ship CBD products nationally, which is illegal. Often such products are sold online and are therefore available throughout the country. CBD is openly sold in head shops and health food stores in some states where such sales have not been explicitly legalized. Selling unapproved products with unsubstantiated therapeutic claims is not only a violation of the law, but also can put patients at risk, as these products have not been proven to be safe or effective. Federal illegality makes it difficult to conduct research on CBD.  Reference: "FDA Regulation of Cannabis and Cannabis-Derived Products, Including Cannabidiol (CBD)" - [employer]  Warning: CBD is not FDA approved and has not undergo the same manufacturing controls as prescription drugs.  This means that the purity and safety of available CBD may be questionable. Most of the time, despite manufacturer's claims, it is contaminated with THC (delta-9-tetrahydrocannabinol - the chemical in marijuana responsible for the "HIGH").  When this is the case, the [place] contaminant will trigger a positive urine drug screen (UDS) test for Marijuana (carboxy-THC). Because a positive UDS for any illicit substance is a violation of our medication agreement, your opioid analgesics (pain medicine) may be  permanently discontinued.  MORE ABOUT CBD  General Information: CBD  is a derivative of the Marijuana (cannabis sativa) plant discovered in 29. It is one of the 113 identified substances found  in Marijuana. It accounts for up to 40% of the plant's extract. As of 2018, preliminary clinical studies on CBD included research for the treatment of anxiety, movement disorders, and pain. CBD is available and consumed in multiple forms, including inhalation of smoke or vapor, as an aerosol spray, and by mouth. It may be supplied as an oil containing CBD, capsules, dried cannabis, or as a liquid solution. CBD is thought not to be as psychoactive as THC (delta-9-tetrahydrocannabinol - the chemical in marijuana responsible for the "HIGH"). Studies suggest that CBD may interact with different biological target receptors in the body, including cannabinoid and other neurotransmitter receptors. As of 2018 the mechanism of action for its biological effects has not been determined.  Side-effects  Adverse reactions: Dry mouth, diarrhea, decreased appetite, fatigue, drowsiness, malaise, weakness, sleep disturbances, and others.  Drug interactions: CBC may interact with other medications such as blood-thinners. (Last update: 06/09/2020) ____________________________________________________________________________________________   ____________________________________________________________________________________________  Drug Holidays (Slow)  What is a "Drug Holiday"? Drug Holiday: is the name given to the period of time during which a patient stops taking a medication(s) for the purpose of eliminating tolerance to the drug.  Benefits . Improved effectiveness of opioids. . Decreased opioid dose needed to achieve benefits. . Improved pain with lesser dose.  What is tolerance? Tolerance: is the progressive decreased in effectiveness of a drug due to its repetitive use. With repetitive use, the body gets use  to the medication and as a consequence, it loses its effectiveness. This is a common problem seen with opioid pain medications. As a result, a larger dose of the drug is needed to achieve the same effect that used to be obtained with a smaller dose.  How long should a "Drug Holiday" last? You should stay off of the pain medicine for at least 14 consecutive days. (2 weeks)  Should I stop the medicine "cold [place]"? No. You should always coordinate with your Pain Specialist so that he/she can provide you with the correct medication dose to make the transition as smoothly as possible.  How do I stop the medicine? Slowly. You will be instructed to decrease the daily amount of pills that you take by one (1) pill every seven (7) days. This is called a "slow downward taper" of your dose. For example: if you normally take four (4) pills per day, you will be asked to drop this dose to three (3) pills per day for seven (7) days, then to two (2) pills per day for seven (7) days, then to one (1) per day for seven (7) days, and at the end of those last seven (7) days, this is when the "Drug Holiday" would start.   Will I have withdrawals? By doing a "slow downward taper" like this one, it is unlikely that you will experience any significant withdrawal symptoms. Typically, what triggers withdrawals is the sudden stop of a high dose opioid therapy. Withdrawals can usually be avoided by slowly decreasing the dose over a prolonged period of time. If you do not follow these instructions and decide to stop your medication abruptly, withdrawals may be possible.  What are withdrawals? Withdrawals: refers to the wide range of symptoms that occur after stopping or dramatically reducing opiate drugs after heavy and prolonged use. Withdrawal symptoms do not occur to patients that use low dose opioids, or those who take the medication sporadically. Contrary to benzodiazepine (example: Valium, Xanax, etc.) or alcohol  withdrawals ("Delirium Tremens"), opioid withdrawals are not lethal. Withdrawals are  the physical manifestation of the body getting rid of the excess receptors.  Expected Symptoms Early symptoms of withdrawal may include: . Agitation . Anxiety . Muscle aches . Increased tearing . Insomnia . Runny nose . Sweating . Yawning  Late symptoms of withdrawal may include: . Abdominal cramping . Diarrhea . Dilated pupils . Goose bumps . Nausea . Vomiting  Will I experience withdrawals? Due to the slow nature of the taper, it is very unlikely that you will experience any.  What is a slow taper? Taper: refers to the gradual decrease in dose.  (Last update: 05/23/2020) ____________________________________________________________________________________________     ____________________________________________________________________________________________  Preparing for Procedure with Sedation  Procedure appointments are limited to planned procedures: No Prescription Refills. No disability issues will be discussed. No medication changes will be discussed.  Instructions: Oral Intake: Do not eat or drink anything for at least 8 hours prior to your procedure. (Exception: Blood Pressure Medication. See below.) Transportation: Unless otherwise stated by your physician, you may drive yourself after the procedure. Blood Pressure Medicine: Do not forget to take your blood pressure medicine with a sip of water the morning of the procedure. If your Diastolic (lower reading)is above 100 mmHg, elective cases will be cancelled/rescheduled. Blood thinners: These will need to be stopped for procedures. Notify our staff if you are taking any blood thinners. Depending on which one you take, there will be specific instructions on how and when to stop it. Diabetics on insulin: Notify the staff so that you can be scheduled 1st case in the morning. If your diabetes requires high dose insulin, take only   of your normal insulin dose the morning of the procedure and notify the staff that you have done so. Preventing infections: Shower with an antibacterial soap the morning of your procedure. Build-up your immune system: Take 1000 mg of Vitamin C with every meal (3 times a day) the day prior to your procedure. Antibiotics: Inform the staff if you have a condition or reason that requires you to take antibiotics before dental procedures. Pregnancy: If you are pregnant, call and cancel the procedure. Sickness: If you have a cold, fever, or any active infections, call and cancel the procedure. Arrival: You must be in the facility at least 30 minutes prior to your scheduled procedure. Children: Do not bring children with you. Dress appropriately: Bring dark clothing that you would not mind if they get stained. Valuables: Do not bring any jewelry or valuables.  Reasons to call and reschedule or cancel your procedure: (Following these recommendations will minimize the risk of a serious complication.) Surgeries: Avoid having procedures within 2 weeks of any surgery. (Avoid for 2 weeks before or after any surgery). Flu Shots: Avoid having procedures within 2 weeks of a flu shots or . (Avoid for 2 weeks before or after immunizations). Barium: Avoid having a procedure within 7-10 days after having had a radiological study involving the use of radiological contrast. (Myelograms, Barium swallow or enema study). Heart attacks: Avoid any elective procedures or surgeries for the initial 6 months after a "Myocardial Infarction" (Heart Attack). Blood thinners: It is imperative that you stop these medications before procedures. Let us know if you if you take any blood thinner.  Infection: Avoid procedures during or within two weeks of an infection (including chest colds or gastrointestinal problems). Symptoms associated with infections include: Localized redness, fever, chills, night sweats or profuse sweating, burning  sensation when voiding, cough, congestion, stuffiness, runny nose, sore throat, diarrhea, nausea, vomiting, cold  or Flu symptoms, recent or current infections. It is specially important if the infection is over the area that we intend to treat. Heart and lung problems: Symptoms that may suggest an active cardiopulmonary problem include: cough, chest pain, breathing difficulties or shortness of breath, dizziness, ankle swelling, uncontrolled high or unusually low blood pressure, and/or palpitations. If you are experiencing any of these symptoms, cancel your procedure and contact your primary care physician for an evaluation.  Remember:  Regular Business hours are:  Monday to Thursday 8:00 AM to 4:00 PM  Provider's Schedule: Milinda Pointer, MD:  Procedure days: Tuesday and Thursday 7:30 AM to 4:00 PM  Gillis Santa, MD:  Procedure days: Monday and Wednesday 7:30 AM to 4:00 PM ____________________________________________________________________________________________  Sacroiliac (SI) Joint Injection Patient Information  Description: The sacroiliac joint connects the scrum (very low back and tailbone) to the ilium (a pelvic bone which also forms half of the hip joint).  Normally this joint experiences very little motion.  When this joint becomes inflamed or unstable low back and or hip and pelvis pain may result.  Injection of this joint with local anesthetics (numbing medicines) and steroids can provide diagnostic information and reduce pain.  This injection is performed with the aid of x-ray guidance into the tailbone area while you are lying on your stomach.   You may experience an electrical sensation down the leg while this is being done.  You may also experience numbness.  We also may ask if we are reproducing your normal pain during the injection.  Conditions which may be treated SI injection:  Low back, buttock, hip or leg pain  Preparation for the Injection:  Do not eat any solid  food or dairy products within 8 hours of your appointment.  You may drink clear liquids up to 3 hours before appointment.  Clear liquids include water, black coffee, juice or soda.  No milk or cream please. You may take your regular medications, including pain medications with a sip of water before your appointment.  Diabetics should hold regular insulin (if take separately) and take 1/2 normal NPH dose the morning of the procedure.  Carry some sugar containing items with you to your appointment. A driver must accompany you and be prepared to drive you home after your procedure. Bring all of your current medications with you. An IV may be inserted and sedation may be given at the discretion of the physician. A blood pressure cuff, EKG and other monitors will often be applied during the procedure.  Some patients may need to have extra oxygen administered for a short period.  You will be asked to provide medical information, including your allergies, prior to the procedure.  We must know immediately if you are taking blood thinners (like Coumadin/Warfarin) or if you are allergic to IV iodine contrast (dye).  We must know if you could possible be pregnant.  Possible side effects:  Bleeding from needle site Infection (rare, may require surgery) Nerve injury (rare) Numbness & tingling (temporary) A brief convulsion or seizure Light-headedness (temporary) Pain at injection site (several days) Decreased blood pressure (temporary) Weakness in the leg (temporary)   Call if you experience:  New onset weakness or numbness of an extremity below the injection site that last more than 8 hours. Hives or difficulty breathing ( go to the emergency room) Inflammation or drainage at the injection site Any new symptoms which are concerning to you  Please note:  Although the local anesthetic injected can often make  your back/ hip/ buttock/ leg feel good for several hours after the injections, the pain will  likely return.  It takes 3-7 days for steroids to work in the sacroiliac area.  You may not notice any pain relief for at least that one week.  If effective, we will often do a series of three injections spaced 3-6 weeks apart to maximally decrease your pain.  After the initial series, we generally will wait some months before a repeat injection of the same type.  If you have any questions, please call [phone] [facility]

## 2021-04-09 NOTE — Progress Notes (Signed)
Safety precautions to be maintained throughout the outpatient stay will include: orient to surroundings, keep bed in low position, maintain call bell within reach at all times, provide assistance with transfer out of bed and ambulation.  

## 2021-04-10 ENCOUNTER — Other Ambulatory Visit: Payer: Self-pay

## 2021-04-15 ENCOUNTER — Other Ambulatory Visit: Payer: Self-pay

## 2021-04-16 ENCOUNTER — Other Ambulatory Visit: Payer: Self-pay

## 2021-04-16 ENCOUNTER — Encounter: Payer: Self-pay | Admitting: Pain Medicine

## 2021-04-16 ENCOUNTER — Ambulatory Visit (HOSPITAL_BASED_OUTPATIENT_CLINIC_OR_DEPARTMENT_OTHER): Payer: 59 | Admitting: Pain Medicine

## 2021-04-16 ENCOUNTER — Ambulatory Visit
Admission: RE | Admit: 2021-04-16 | Discharge: 2021-04-16 | Disposition: A | Discharge status: Home / Self Care | Payer: 59 | Source: Ambulatory Visit | Attending: Pain Medicine | Admitting: Pain Medicine

## 2021-04-16 VITALS — BP 134/94 | HR 84 | Temp 98.3°F | Resp 14 | Ht 62.0 in | Wt 165.0 lb

## 2021-04-16 DIAGNOSIS — M47898 Other spondylosis, sacral and sacrococcygeal region: Secondary | ICD-10-CM

## 2021-04-16 DIAGNOSIS — M533 Sacrococcygeal disorders, not elsewhere classified: Secondary | ICD-10-CM | POA: Insufficient documentation

## 2021-04-16 DIAGNOSIS — G8929 Other chronic pain: Secondary | ICD-10-CM

## 2021-04-16 DIAGNOSIS — M9904 Segmental and somatic dysfunction of sacral region: Secondary | ICD-10-CM | POA: Insufficient documentation

## 2021-04-16 DIAGNOSIS — M545 Low back pain, unspecified: Secondary | ICD-10-CM | POA: Insufficient documentation

## 2021-04-16 DIAGNOSIS — M779 Enthesopathy, unspecified: Secondary | ICD-10-CM | POA: Insufficient documentation

## 2021-04-16 MED ORDER — ROPIVACAINE HCL 2 MG/ML IJ SOLN
9.0000 mL | Freq: Once | INTRAMUSCULAR | Status: AC
  Administered 2021-04-16: 9 mL via INTRA_ARTICULAR

## 2021-04-16 MED ORDER — FENTANYL CITRATE (PF) 100 MCG/2ML IJ SOLN
INTRAMUSCULAR | Status: AC
  Filled 2021-04-16: qty 2

## 2021-04-16 MED ORDER — MIDAZOLAM HCL 5 MG/5ML IJ SOLN
1.0000 mg | INTRAMUSCULAR | Status: DC | PRN
  Administered 2021-04-16: 2 mg via INTRAVENOUS

## 2021-04-16 MED ORDER — ROPIVACAINE HCL 2 MG/ML IJ SOLN
INTRAMUSCULAR | Status: AC
  Filled 2021-04-16: qty 20

## 2021-04-16 MED ORDER — MIDAZOLAM HCL 5 MG/5ML IJ SOLN
INTRAMUSCULAR | Status: AC
  Filled 2021-04-16: qty 5

## 2021-04-16 MED ORDER — METHYLPREDNISOLONE ACETATE 80 MG/ML IJ SUSP
80.0000 mg | Freq: Once | INTRAMUSCULAR | Status: AC
  Administered 2021-04-16: 80 mg via INTRA_ARTICULAR

## 2021-04-16 MED ORDER — LACTATED RINGERS IV SOLN
1000.0000 mL | Freq: Once | INTRAVENOUS | Status: AC
Start: 2021-04-16 — End: 2021-04-16
  Administered 2021-04-16: 1000 mL via INTRAVENOUS

## 2021-04-16 MED ORDER — METHYLPREDNISOLONE ACETATE 80 MG/ML IJ SUSP
INTRAMUSCULAR | Status: AC
  Filled 2021-04-16: qty 1

## 2021-04-16 MED ORDER — LIDOCAINE HCL (PF) 2 % IJ SOLN
INTRAMUSCULAR | Status: AC
  Filled 2021-04-16: qty 10

## 2021-04-16 MED ORDER — LIDOCAINE HCL 2 % IJ SOLN
20.0000 mL | Freq: Once | INTRAMUSCULAR | Status: AC
  Administered 2021-04-16: 400 mg

## 2021-04-16 MED ORDER — FENTANYL CITRATE (PF) 100 MCG/2ML IJ SOLN
25.0000 ug | INTRAMUSCULAR | Status: DC | PRN
Start: 2021-04-16 — End: 2021-04-16
  Administered 2021-04-16: 50 ug via INTRAVENOUS

## 2021-04-16 NOTE — Progress Notes (Signed)
Safety precautions to be maintained throughout the outpatient stay will include: orient to surroundings, keep bed in low position, maintain call bell within reach at all times, provide assistance with transfer out of bed and ambulation.  

## 2021-04-16 NOTE — Patient Instructions (Signed)

## 2021-04-16 NOTE — Progress Notes (Signed)
PROVIDER NOTE: Information contained herein reflects review and annotations entered in association with encounter. Interpretation of such information and data should be left to medically-trained personnel. Information provided to patient can be located elsewhere in the medical record under "Patient Instructions". Document created using STT-dictation technology, any transcriptional errors that may result from process are unintentional.    Patient: Tammy Deleon  Service Category: Procedure  Provider: Gaspar Cola, MD  DOB: 1985/11/29  DOS: 04/16/2021  Location: [facility]  MRN: [id number]  Setting: Ambulatory - outpatient  Referring Provider: Adin Hector, MD  Type: Established Patient  Specialty: Interventional Pain Management  PCP: Adin Hector, MD   Primary Reason for Visit: Interventional Pain Management Treatment. CC: Back Pain  Procedure:          Anesthesia, Analgesia, Anxiolysis:  Type: Diagnostic Sacroiliac Joint Steroid Injection #1  Region: Superior Lumbosacral Region Level: PSIS (Posterior Superior Iliac Spine) Laterality: Right  Type: Moderate (Conscious) Sedation combined with Local Anesthesia Indication(s): Analgesia and Anxiety Route: Intravenous (IV) IV Access: Secured Sedation: Meaningful verbal contact was maintained at all times during the procedure  Local Anesthetic: Lidocaine 1-2%  Position: Prone           Indications: 1. Other spondylosis, sacral and sacrococcygeal region   2. Chronic sacroiliac joint pain (Right)   3. Enthesopathy of sacroiliac joint (Right)   4. Somatic dysfunction of sacroiliac joint (Right)   5. Chronic low back pain (1ry area of Pain) (Right) w/o sciatica    Pain Score: Pre-procedure: 5 /10 Post-procedure: 0-No pain/10   Pre-op H&P Assessment:  Tammy Deleon is a 35 y.o. (year old), female patient, seen today for interventional treatment. She  has a past surgical history that includes Gallbladder  surgery; Hemi-microdiscectomy lumbar laminectomy level 1 (Right, 01/02/2020); and Hemi-microdiscectomy lumbar laminectomy level 1 (Right, 11/05/2020). Tammy Deleon has a current medication list which includes the following prescription(s): amphetamine-dextroamphetamine, hydrocodone-acetaminophen, ibuprofen, lamotrigine, levonorgestrel-ethinyl estradiol, trazodone, trazodone, valacyclovir, and phentermine, and the following Facility-Administered Medications: fentanyl and midazolam. Her primarily concern today is the Back Pain  Initial Vital Signs:  Pulse/HCG Rate: 84ECG Heart Rate: 84 Temp: (!) 97.2 F (36.2 C) Resp: 16 BP: (!) 136/97 SpO2: 100 %  BMI: Estimated body mass index is 30.18 kg/m as calculated from the following:   Height as of this encounter: 5' 2"  (1.575 m).   Weight as of this encounter: 165 lb (74.8 kg).  Risk Assessment: Allergies: Reviewed. She is allergic to sulfa antibiotics.  Allergy Precautions: None required Coagulopathies: Reviewed. None identified.  Blood-thinner therapy: None at this time Active Infection(s): Reviewed. None identified. Tammy Deleon is afebrile  Site Confirmation: Tammy Deleon was asked to confirm the procedure and laterality before marking the site Procedure checklist: Completed Consent: Before the procedure and under the influence of no sedative(s), amnesic(s), or anxiolytics, the patient was informed of the treatment options, risks and possible complications. To fulfill our ethical and legal obligations, as recommended by the American Medical Association's Code of Ethics, I have informed the patient of my clinical impression; the nature and purpose of the treatment or procedure; the risks, benefits, and possible complications of the intervention; the alternatives, including doing nothing; the risk(s) and benefit(s) of the alternative treatment(s) or procedure(s); and the risk(s) and benefit(s) of doing nothing. The patient was provided information  about the general risks and possible complications associated with the procedure. These may include, but are not limited to: failure to achieve desired goals, infection,  bleeding, organ or nerve damage, allergic reactions, paralysis, and death. In addition, the patient was informed of those risks and complications associated to the procedure, such as failure to decrease pain; infection; bleeding; organ or nerve damage with subsequent damage to sensory, motor, and/or autonomic systems, resulting in permanent pain, numbness, and/or weakness of one or several areas of the body; allergic reactions; (i.e.: anaphylactic reaction); and/or death. Furthermore, the patient was informed of those risks and complications associated with the medications. These include, but are not limited to: allergic reactions (i.e.: anaphylactic or anaphylactoid reaction(s)); adrenal axis suppression; blood sugar elevation that in diabetics may result in ketoacidosis or comma; water retention that in patients with history of congestive heart failure may result in shortness of breath, pulmonary edema, and decompensation with resultant heart failure; weight gain; swelling or edema; medication-induced neural toxicity; particulate matter embolism and blood vessel occlusion with resultant organ, and/or nervous system infarction; and/or aseptic necrosis of one or more joints. Finally, the patient was informed that Medicine is not an exact science; therefore, there is also the possibility of unforeseen or unpredictable risks and/or possible complications that may result in a catastrophic outcome. The patient indicated having understood very clearly. We have given the patient no guarantees and we have made no promises. Enough time was given to the patient to ask questions, all of which were answered to the patient's satisfaction. Tammy Deleon has indicated that she wanted to continue with the procedure. Attestation: I, the ordering provider, attest  that I have discussed with the patient the benefits, risks, side-effects, alternatives, likelihood of achieving goals, and potential problems during recovery for the procedure that I have provided informed consent. Date  Time: 04/16/2021  8:02 AM  Pre-Procedure Preparation:  Monitoring: As per clinic protocol. Respiration, ETCO2, SpO2, BP, heart rate and rhythm monitor placed and checked for adequate function Safety Precautions: Patient was assessed for positional comfort and pressure points before starting the procedure. Time-out: I initiated and conducted the "Time-out" before starting the procedure, as per protocol. The patient was asked to participate by confirming the accuracy of the "Time Out" information. Verification of the correct person, site, and procedure were performed and confirmed by me, the nursing staff, and the patient. "Time-out" conducted as per Joint Commission's Universal Protocol (UP.01.01.01). Time: 1027  Description of Procedure:          Target Area: Superior, posterior, aspect of the sacroiliac fissure Approach: Posterior, paraspinal, ipsilateral approach. Area Prepped: Entire Lower Lumbosacral Region DuraPrep (Iodine Povacrylex [0.7% available iodine] and Isopropyl Alcohol, 74% w/w) Safety Precautions: Aspiration looking for blood return was conducted prior to all injections. At no point did we inject any substances, as a needle was being advanced. No attempts were made at seeking any paresthesias. Safe injection practices and needle disposal techniques used. Medications properly checked for expiration dates. SDV (single dose vial) medications used. Description of the Procedure: Protocol guidelines were followed. The patient was placed in position over the procedure table. The target area was identified and the area prepped in the usual manner. Skin & deeper tissues infiltrated with local anesthetic. Appropriate amount of time allowed to pass for local anesthetics to take  effect. The procedure needle was advanced under fluoroscopic guidance into the sacroiliac joint until a firm endpoint was obtained. Proper needle placement secured. Negative aspiration confirmed. Solution injected in intermittent fashion, asking for systemic symptoms every 0.5cc of injectate. The needles were then removed and the area cleansed, making sure to leave some of the prepping  solution back to take advantage of its long term bactericidal properties.  Vitals:   04/16/21 0850 04/16/21 0858 04/16/21 0908 04/16/21 0918  BP: (!) 132/95 120/88 (!) 128/98 (!) 134/94  Pulse:      Resp: 16 14 12 14   Temp:  98.5 F (36.9 C)  98.3 F (36.8 C)  TempSrc:  Temporal  Temporal  SpO2: 100% 100% 100% 100%  Weight:      Height:        Start Time: 0844 hrs. End Time: 0850 hrs. Materials:  Needle(s) Type: Spinal Needle Gauge: 22G Length: 5.0-in Medication(s): Please see orders for medications and dosing details.  Imaging Guidance (Non-Spinal):          Type of Imaging Technique: Fluoroscopy Guidance (Non-Spinal) Indication(s): Assistance in needle guidance and placement for procedures requiring needle placement in or near specific anatomical locations not easily accessible without such assistance. Exposure Time: Please see nurses notes. Contrast: Before injecting any contrast, we confirmed that the patient did not have an allergy to iodine, shellfish, or radiological contrast. Once satisfactory needle placement was completed at the desired level, radiological contrast was injected. Contrast injected under live fluoroscopy. No contrast complications. See chart for type and volume of contrast used. Fluoroscopic Guidance: I was personally present during the use of fluoroscopy. "Tunnel Vision Technique" used to obtain the best possible view of the target area. Parallax error corrected before commencing the procedure. "Direction-depth-direction" technique used to introduce the needle under continuous  pulsed fluoroscopy. Once target was reached, antero-posterior, oblique, and lateral fluoroscopic projection used confirm needle placement in all planes. Images permanently stored in EMR. Interpretation: I personally interpreted the imaging intraoperatively. Adequate needle placement confirmed in multiple planes. Appropriate spread of contrast into desired area was observed. No evidence of afferent or efferent intravascular uptake. Permanent images saved into the patient's record.  Antibiotic Prophylaxis:   Anti-infectives (From admission, onward)    None      Indication(s): None identified  Post-operative Assessment:  Post-procedure Vital Signs:  Pulse/HCG Rate: 8482 Temp: 98.3 F (36.8 C) Resp: 14 BP: (!) 134/94 SpO2: 100 %  EBL: None  Complications: No immediate post-treatment complications observed by team, or reported by patient.  Note: The patient tolerated the entire procedure well. A repeat set of vitals were taken after the procedure and the patient was kept under observation following institutional policy, for this type of procedure. Post-procedural neurological assessment was performed, showing return to baseline, prior to discharge. The patient was provided with post-procedure discharge instructions, including a section on how to identify potential problems. Should any problems arise concerning this procedure, the patient was given instructions to immediately contact us, at any time, without hesitation. In any case, we plan to contact the patient by telephone for a follow-up status report regarding this interventional procedure.  Comments:  No additional relevant information.  Plan of Care  Orders:  Orders Placed This Encounter  Procedures   SACROILIAC JOINT INJECTION    Scheduling Instructions:     Side: Right-sided     Sedation: Patient's choice.     Timeframe: Today    Order Specific Question:   Where will this procedure be performed?    Answer:   [facility]   DG [facility] C-ARM 1-60 MIN NO REPORT    Intraoperative interpretation by procedural physician at [place].    Standing Status:   Standing    Number of Occurrences:   1    Order Specific Question:  Reason for exam:    Answer:   Assistance in needle guidance and placement for procedures requiring needle placement in or near specific anatomical locations not easily accessible without such assistance.   Informed Consent Details: Physician/Practitioner Attestation; Transcribe to consent form and obtain patient signature    Nursing Order: Transcribe to consent form and obtain patient signature. Note: Always confirm laterality of pain with Ms. Hardin Negus, before procedure.    Order Specific Question:   Physician/Practitioner attestation of informed consent for procedure/surgical case    Answer:   I, the physician/practitioner, attest that I have discussed with the patient the benefits, risks, side effects, alternatives, likelihood of achieving goals and potential problems during recovery for the procedure that I have provided informed consent.    Order Specific Question:   Procedure    Answer:   Sacroiliac Joint Block    Order Specific Question:   Physician/Practitioner performing the procedure    Answer:   Vela Render A. Dossie Arbour, MD    Order Specific Question:   Indication/Reason    Answer:   Chronic Low Back and Hip Pain secondary to Sacroiliac Joint Pain (Arthralgia/Arthropathy)   Provide equipment / supplies at bedside    "Block Tray" (Disposable  single use) Needle type: SpinalSpinal Amount/quantity: 1 Size: Medium (5-inch) Gauge: 22G    Standing Status:   Standing    Number of Occurrences:   1    Order Specific Question:   Specify    Answer:   Block Tray    Chronic Opioid Analgesic:  Hydrocodone/APAP 5/325, 1 tab p.o. 4 times daily (#56) (filled 02/27/2021) MME/day: 20 mg/day   Medications ordered for procedure: Meds ordered this encounter  Medications    lidocaine (XYLOCAINE) 2 % (with pres) injection 400 mg   lactated ringers infusion 1,000 mL   midazolam (VERSED) 5 MG/5ML injection 1-2 mg    Make sure Flumazenil is available in the pyxis when using this medication. If oversedation occurs, administer 0.2 mg IV over 15 sec. If after 45 sec no response, administer 0.2 mg again over 1 min; may repeat at 1 min intervals; not to exceed 4 doses (1 mg)   fentaNYL (SUBLIMAZE) injection 25-50 mcg    Make sure Narcan is available in the pyxis when using this medication. In the event of respiratory depression (RR< 8/min): Titrate NARCAN (naloxone) in increments of 0.1 to 0.2 mg IV at 2-3 minute intervals, until desired degree of reversal.   methylPREDNISolone acetate (DEPO-MEDROL) injection 80 mg   ropivacaine (PF) 2 mg/mL (0.2%) (NAROPIN) injection 9 mL    Medications administered: We administered lidocaine, lactated ringers, midazolam, fentaNYL, methylPREDNISolone acetate, and ropivacaine (PF) 2 mg/mL (0.2%).  See the medical record for exact dosing, route, and time of administration.  Follow-up plan:   Return in about 2 weeks (around 04/30/2021) for procedure day (afternoon F2F) (PPE).      Interventional Therapies  Risk  Complexity Considerations:   Estimated body mass index is 29.63 kg/m as calculated from the following:   Height as of this encounter: 5' 2"  (1.575 m).   Weight as of this encounter: 162 lb (73.5 kg). WNL   Planned  Pending:   Diagnostic right sacroiliac joint block #1    Under consideration:   Diagnostic right sacroiliac joint block  Diagnostic right caudal ESI #1 + diagnostic epidurogram  Possible candidate for right-sided Racz procedure  Diagnostic right lumbar facet block  Possible candidate for right thoracolumbar spinal cord stimulator trial/implant    Completed:  None at this time   Therapeutic  Palliative (PRN) options:   None established     Recent Visits Date Type Provider Dept  04/09/21 Office Visit  Milinda Pointer, MD [facility]  03/13/21 Office Visit Milinda Pointer, MD [facility]  Showing recent visits within past 90 days and meeting all other requirements Today's Visits Date Type Provider Dept  04/16/21 Procedure visit Milinda Pointer, MD [facility]  Showing today's visits and meeting all other requirements Future Appointments Date Type Provider Dept  04/30/21 Appointment Milinda Pointer, MD [facility]  Showing future appointments within next 90 days and meeting all other requirements Disposition: Discharge home  Discharge (Date  Time): 04/16/2021; 0919 hrs.   Primary Care Physician: Adin Hector, MD Location: [facility] Note by: Gaspar Cola, MD Date: 04/16/2021; Time: 12:12 PM  Disclaimer:  Medicine is not an Chief Strategy Officer. The only guarantee in medicine is that nothing is guaranteed. It is important to note that the decision to proceed with this intervention was based on the information collected from the patient. The Data and conclusions were drawn from the patient's questionnaire, the interview, and the physical examination. Because the information was provided in large part by the patient, it cannot be guaranteed that it has not been purposely or unconsciously manipulated. Every effort has been made to obtain as much relevant data as possible for this evaluation. It is important to note that the conclusions that lead to this procedure are derived in large part from the available data. Always take into account that the treatment will also be dependent on availability of resources and existing treatment guidelines, considered by other Pain Management Practitioners as being common knowledge and practice, at the time of the intervention. For Medico-Legal purposes, it is also important to point out that variation in procedural techniques and pharmacological choices are the acceptable norm.  The indications, contraindications, technique, and results of the above procedure should only be interpreted and judged by a Board-Certified Interventional Pain Specialist with extensive familiarity and expertise in the same exact procedure and technique.

## 2021-04-17 ENCOUNTER — Telehealth: Payer: Self-pay

## 2021-04-17 NOTE — Telephone Encounter (Signed)
Post procedure phone call.  Patient sates she is doing well.

## 2021-04-21 MED FILL — Ibuprofen Tab 800 MG: ORAL | 10 days supply | Qty: 30 | Fill #4 | Status: AC

## 2021-04-22 ENCOUNTER — Other Ambulatory Visit: Payer: Self-pay

## 2021-04-24 ENCOUNTER — Other Ambulatory Visit: Payer: Self-pay

## 2021-04-25 ENCOUNTER — Other Ambulatory Visit: Payer: Self-pay

## 2021-04-25 MED ORDER — AMPHETAMINE-DEXTROAMPHETAMINE 30 MG PO TABS
1.0000 | ORAL_TABLET | Freq: Two times a day (BID) | ORAL | 0 refills | Status: DC
  Filled 2021-04-25: qty 180, 90d supply, fill #0

## 2021-04-25 MED FILL — Lamotrigine Tab 200 MG: ORAL | 30 days supply | Qty: 30 | Fill #2 | Status: AC

## 2021-04-30 ENCOUNTER — Other Ambulatory Visit: Payer: Self-pay

## 2021-04-30 ENCOUNTER — Ambulatory Visit: Payer: 59 | Attending: Pain Medicine | Admitting: Pain Medicine

## 2021-04-30 ENCOUNTER — Encounter: Payer: Self-pay | Admitting: Pain Medicine

## 2021-04-30 VITALS — BP 148/95 | HR 102 | Temp 97.0°F | Resp 18 | Ht 62.0 in | Wt 165.0 lb

## 2021-04-30 DIAGNOSIS — M961 Postlaminectomy syndrome, not elsewhere classified: Secondary | ICD-10-CM | POA: Insufficient documentation

## 2021-04-30 DIAGNOSIS — M79604 Pain in right leg: Secondary | ICD-10-CM | POA: Diagnosis not present

## 2021-04-30 DIAGNOSIS — M545 Low back pain, unspecified: Secondary | ICD-10-CM | POA: Insufficient documentation

## 2021-04-30 DIAGNOSIS — Z79899 Other long term (current) drug therapy: Secondary | ICD-10-CM | POA: Diagnosis not present

## 2021-04-30 DIAGNOSIS — G8929 Other chronic pain: Secondary | ICD-10-CM | POA: Diagnosis not present

## 2021-04-30 DIAGNOSIS — G96198 Other disorders of meninges, not elsewhere classified: Secondary | ICD-10-CM | POA: Diagnosis not present

## 2021-04-30 DIAGNOSIS — G894 Chronic pain syndrome: Secondary | ICD-10-CM | POA: Diagnosis not present

## 2021-04-30 DIAGNOSIS — Z79891 Long term (current) use of opiate analgesic: Secondary | ICD-10-CM | POA: Insufficient documentation

## 2021-04-30 DIAGNOSIS — M533 Sacrococcygeal disorders, not elsewhere classified: Secondary | ICD-10-CM | POA: Diagnosis not present

## 2021-04-30 MED ORDER — HYDROCODONE-ACETAMINOPHEN 5-325 MG PO TABS
1.0000 | ORAL_TABLET | Freq: Four times a day (QID) | ORAL | 0 refills | Status: DC | PRN
  Filled 2021-06-12: qty 100, 30d supply, fill #0
  Filled 2021-06-12: qty 100, 25d supply, fill #0
  Filled 2021-06-12: qty 100, 30d supply, fill #0

## 2021-04-30 MED ORDER — HYDROCODONE-ACETAMINOPHEN 5-325 MG PO TABS
1.0000 | ORAL_TABLET | Freq: Four times a day (QID) | ORAL | 0 refills | Status: DC | PRN
  Filled 2021-05-10: qty 100, 25d supply, fill #0
  Filled 2021-05-12: qty 100, 30d supply, fill #0
  Filled 2021-05-15 (×2): qty 100, 25d supply, fill #0

## 2021-04-30 NOTE — Progress Notes (Signed)
PROVIDER NOTE: Information contained herein reflects review and annotations entered in association with encounter. Interpretation of such information and data should be left to medically-trained personnel. Information provided to patient can be located elsewhere in the medical record under "Patient Instructions". Document created using STT-dictation technology, any transcriptional errors that may result from process are unintentional.    Patient: Tammy Deleon  Service Category: E/M  Provider: Gaspar Cola, MD  DOB: Sep 13, 1986  DOS: 04/30/2021  Specialty: Interventional Pain Management  MRN: [id number]  Setting: Ambulatory outpatient  PCP: Tammy Hector, MD  Type: Established Patient    Referring Provider: Adin Hector, MD  Location: Office  Delivery: Face-to-face     HPI  [name], a 35 y.o. year old female, is here today because of her Chronic right-sided low back pain without sciatica [M54.50, G89.29]. [name] primary complain today is Back Pain (Dead center of her low back) Last encounter: My last encounter with her was on 04/16/2021. Pertinent problems: [name] has Headache disorder; Lumbar disc disease; Chronic pain syndrome; Failed back surgical syndrome; Chronic low back pain (1ry area of Pain) (Right) w/o sciatica; Epidural fibrosis (S1) (Right); Lumbar facet joint syndrome (Right); Chronic hip pain (Right); Abnormal MRI, lumbar spine (02/04/2021); Lumbosacral facet arthropathy (L5-S1); Lumbosacral lateral recess stenosis (L5-S1) (Bilateral); History of lumbar laminectomy x2; Chronic sacroiliac joint pain (Right); Somatic dysfunction of sacroiliac joint (Right); Other spondylosis, sacral and sacrococcygeal region; Enthesopathy of sacroiliac joint (Right); and Chronic lower extremity pain (intermittent) (2ry area of Pain) (Right) on their pertinent problem list. Pain Assessment: Severity of Chronic pain is reported as a 5 /10. Location: Back  Lower/buttocks at times. Onset: More than a month ago. Quality: Aching, Constant. Timing: Constant. Modifying factor(s): medications, rest, heat. Vitals:  height is 5' 2"  (1.575 m) and weight is 165 lb (74.8 kg). Her temporal temperature is 97 F (36.1 C) (abnormal). Her blood pressure is 148/95 (abnormal) and her pulse is 102 (abnormal). Her respiration is 18 and oxygen saturation is 100%.   Reason for encounter: both, medication management and post-procedure assessment.   The patient indicates doing well with the current medication regimen. No adverse reactions or side effects reported to the medications.   The patient indicates having attained 100% relief of the pain for the duration of the local anesthetic which then dropped down to a 50% improvement that lasted just a couple days.  At this point, the only discomfort that she indicates having is in the center of the lower back over the sacrum area.  She describes that this is not a pain, but more of a pressure and discomfort.  She is currently experiencing no lower extremity symptoms and she is also not having any pain on the right lower back region.  Physical exam today was negative for hyperextension on rotation as well as negative on the Patrick maneuver.  However, straight leg raise did reproduce some of the pain in the above-mentioned region.  She does have a history of a prior back surgery with possible epidural fibrosis.  At this time, the neck step is to do a diagnostic caudal epidural steroid injection/epidurogram to confirm whether or not we are in fact dealing with epidural fibrosis.  If the epidurogram is positive for an obstruction of the flow of contrast and she gets some benefit from the caudal epidural steroid injection, then she may be an adequate candidate for a Racz procedure.  The above was shared with the patient  who understood and accepted.  RTCB: 07/09/2021  Post-Procedure Evaluation  Procedure (04/16/2021): Diagnostic right SI  joint injection #1 under fluoroscopic guidance and IV sedation Pre-procedure pain level: 5/10 Post-procedure: 0/10 (100% relief)  Anxiolysis: Moderate conscious sedation.  Effectiveness during initial hour after procedure (Ultra-Short Term Relief): 100 %.  Local anesthetic used: Long-acting (4-6 hours) Effectiveness: Defined as any analgesic benefit obtained secondary to the administration of local anesthetics. This carries significant diagnostic value as to the etiological location, or anatomical origin, of the pain. Duration of benefit is expected to coincide with the duration of the local anesthetic used.  Effectiveness during initial 4-6 hours after procedure (Short-Term Relief): 100 %.  Long-term benefit: Defined as any relief past the pharmacologic duration of the local anesthetics.  Effectiveness past the initial 6 hours after procedure (Long-Term Relief): 50 % (for only a few days, then return to baseline).  Benefits, current: Defined as benefit present at the time of this evaluation.   Analgesia:  Back to baseline Function: Back to baseline ROM: Back to baseline  Pharmacotherapy Assessment  Analgesic: Hydrocodone/APAP 5/325, 1 tab p.o. 4 times daily (#56) (filled 02/27/2021) MME/day: 20 mg/day   Monitoring: [place] PMP: PDMP reviewed during this encounter.       Pharmacotherapy: No side-effects or adverse reactions reported. Compliance: No problems identified. Effectiveness: Clinically acceptable.  Hart Rochester, RN  04/30/2021  3:01 PM  Sign when Signing Visit Nursing Pain Medication Assessment:  Safety precautions to be maintained throughout the outpatient stay will include: orient to surroundings, keep bed in low position, maintain call bell within reach at all times, provide assistance with transfer out of bed and ambulation.  Medication Inspection Compliance: Pill count conducted under aseptic conditions, in front of the patient. Neither the pills nor the bottle was  removed from the patient's sight at any time. Once count was completed pills were immediately returned to the patient in their original bottle.  Medication: Hydrocodone/APAP Pill/Patch Count:  40 of 100 pills remain Pill/Patch Appearance: Markings consistent with prescribed medication Bottle Appearance: Standard pharmacy container. Clearly labeled. Filled Date: 06 / 13 / 2022 Last Medication intake:  Today    UDS:  Summary  Date Value Ref Range Status  03/13/2021 Note  Final    Comment:    ==================================================================== Compliance Drug Analysis, Ur ==================================================================== Test                             Result       Flag       Units  Drug Present and Declared for Prescription Verification   Hydrocodone                    598          EXPECTED   ng/mg creat   Hydromorphone                  38           EXPECTED   ng/mg creat   Dihydrocodeine                 139          EXPECTED   ng/mg creat   Norhydrocodone                 504          EXPECTED   ng/mg creat    Sources of hydrocodone include scheduled prescription  medications.    Hydromorphone, dihydrocodeine and norhydrocodone are expected    metabolites of hydrocodone. Hydromorphone and dihydrocodeine are    also available as scheduled prescription medications.    Lamotrigine                    PRESENT      EXPECTED   Trazodone                      PRESENT      EXPECTED   1,3 chlorophenyl piperazine    PRESENT      EXPECTED    1,3-chlorophenyl piperazine is an expected metabolite of trazodone.    Acetaminophen                  PRESENT      EXPECTED   Ibuprofen                      PRESENT      EXPECTED  Drug Present not Declared for Prescription Verification   Tramadol                       >3759        UNEXPECTED ng/mg creat   O-Desmethyltramadol            >3759        UNEXPECTED ng/mg creat   N-Desmethyltramadol            1017          UNEXPECTED ng/mg creat    Source of tramadol is a prescription medication. O-desmethyltramadol    and N-desmethyltramadol are expected metabolites of tramadol.    Naproxen                       PRESENT      UNEXPECTED  Drug Absent but Declared for Prescription Verification   Amphetamine                    Not Detected UNEXPECTED ng/mg creat   Phentermine                    Not Detected UNEXPECTED ==================================================================== Test                      Result    Flag   Units      Ref Range   Creatinine              133              mg/dL      >=20 ==================================================================== Declared Medications:  The flagging and interpretation on this report are based on the  following declared medications.  Unexpected results may arise from  inaccuracies in the declared medications.   **Note: The testing scope of this panel includes these medications:   Amphetamine (Adderall)  Hydrocodone (Norco)  Lamotrigine (Lamictal)  Phentermine (Adipex)  Trazodone (Desyrel)   **Note: The testing scope of this panel does not include small to  moderate amounts of these reported medications:   Acetaminophen (Norco)  Ibuprofen (Advil)   **Note: The testing scope of this panel does not include the  following reported medications:   Ethinyl Estradiol  Levonorgestrel  Valacyclovir (Valtrex) ==================================================================== For clinical consultation, please call [phone]. ====================================================================      ROS  Constitutional: Denies any fever or chills Gastrointestinal: No reported hemesis, hematochezia,  vomiting, or acute GI distress Musculoskeletal: Denies any acute onset joint swelling, redness, loss of ROM, or weakness Neurological: No reported episodes of acute onset apraxia, aphasia, dysarthria, agnosia, amnesia, paralysis, loss of  coordination, or loss of consciousness  Medication Review  HYDROcodone-acetaminophen, Levonorgestrel-Ethinyl Estradiol, amphetamine-dextroamphetamine, ibuprofen, lamoTRIgine, phentermine, traZODone, and valACYclovir  History Review  Allergy: Ms. Sarff is allergic to sulfa antibiotics. Drug: Ms. Castonguay  reports no history of drug use. Alcohol:  reports current alcohol use. Tobacco:  reports that she has never smoked. She has never used smokeless tobacco. Social: Ms. Eppard  reports that she has never smoked. She has never used smokeless tobacco. She reports current alcohol use. She reports that she does not use drugs. Medical:  has a past medical history of Anxiety, Depression, Irregular intermenstrual bleeding, and UTI (lower urinary tract infection). Surgical: Ms. Lichtenberger  has a past surgical history that includes Gallbladder surgery; Hemi-microdiscectomy lumbar laminectomy level 1 (Right, 01/02/2020); and Hemi-microdiscectomy lumbar laminectomy level 1 (Right, 11/05/2020). Family: family history is not on file.  Laboratory Chemistry Profile   Renal Lab Results  Component Value Date   BUN 15 03/13/2021   CREATININE 0.89 03/13/2021   LABCREA 208 09/21/2019   BCR 17 03/13/2021   GFRAA >60 12/30/2019   GFRNONAA >60 10/29/2020    Hepatic Lab Results  Component Value Date   AST 18 03/13/2021   ALT 11 09/22/2019   ALBUMIN 4.4 03/13/2021   ALKPHOS 67 03/13/2021   LIPASE 80 03/17/2012    Electrolytes Lab Results  Component Value Date   NA 139 03/13/2021   K 4.7 03/13/2021   CL 103 03/13/2021   CALCIUM 9.9 03/13/2021   MG 1.9 03/13/2021    Bone Lab Results  Component Value Date   25OHVITD1 50 03/13/2021   25OHVITD2 <1.0 03/13/2021   25OHVITD3 50 03/13/2021    Inflammation (CRP: Acute Phase) (ESR: Chronic Phase) Lab Results  Component Value Date   CRP 9 03/13/2021   ESRSEDRATE 14 03/13/2021          Note: Above Lab results reviewed.  Recent Imaging Review  DG  PAIN CLINIC C-ARM 1-60 MIN NO REPORT Fluoro was used, but no Radiologist interpretation will be provided.  Please refer to "NOTES" tab for provider progress note. Note: Reviewed        Physical Exam  General appearance: Well nourished, well developed, and well hydrated. In no apparent acute distress Mental status: Alert, oriented x 3 (person, place, & time)       Respiratory: No evidence of acute respiratory distress Eyes: PERLA Vitals: BP (!) 148/95   Pulse (!) 102   Temp (!) 97 F (36.1 C) (Temporal)   Resp 18   Ht 5' 2"  (1.575 m)   Wt 165 lb (74.8 kg)   SpO2 100%   BMI 30.18 kg/m  BMI: Estimated body mass index is 30.18 kg/m as calculated from the following:   Height as of this encounter: 5' 2"  (1.575 m).   Weight as of this encounter: 165 lb (74.8 kg). Ideal: Ideal body weight: 50.1 kg (110 lb 7.2 oz) Adjusted ideal body weight: 60 kg (132 lb 4.3 oz)  Assessment   Status Diagnosis  Recurring Controlled Reoccurring 1. Chronic low back pain (1ry area of Pain) (Right) w/o sciatica   2. Chronic lower extremity pain (intermittent) (2ry area of Pain) (Right)   3. Chronic sacroiliac joint pain (Right)   4. Failed back surgical syndrome   5. Epidural fibrosis (S1) (Right)  6. Chronic pain syndrome   7. Pharmacologic therapy   8. Chronic use of opiate for therapeutic purpose   9. Encounter for chronic pain management      Updated Problems: Problem  Chronic lower extremity pain (intermittent) (2ry area of Pain) (Right)   Described to run down the anterior and posterior aspect of the leg to the level of the knee with no pain below the knee.      Plan of Care  Problem-specific:  No problem-specific Assessment & Plan notes found for this encounter.  Ms. SHANTESE RAVEN has a current medication list which includes the following long-term medication(s): amphetamine-dextroamphetamine, [START ON 05/10/2021] hydrocodone-acetaminophen, [START ON 06/09/2021]  hydrocodone-acetaminophen, lamotrigine, lamotrigine, levonorgestrel-ethinyl estradiol, phentermine, trazodone, and trazodone.  Pharmacotherapy (Medications Ordered): Meds ordered this encounter  Medications   HYDROcodone-acetaminophen (NORCO/VICODIN) 5-325 MG tablet    Sig: Take 1 tablet by mouth every 6 (six) hours as needed for severe pain. Must last 30 days.    Dispense:  100 tablet    Refill:  0    Not a duplicate. Do NOT delete! Dispense 1 day early if closed on fill date. Warn not to take CNS-depressants 8 hours before or after taking opioid. Do not send refill request. Renewal requires appointment.   HYDROcodone-acetaminophen (NORCO/VICODIN) 5-325 MG tablet    Sig: Take 1 tablet by mouth every 6 (six) hours as needed for severe pain. Must last 30 days.    Dispense:  100 tablet    Refill:  0    Not a duplicate. Do NOT delete! Dispense 1 day early if closed on fill date. Warn not to take CNS-depressants 8 hours before or after taking opioid. Do not send refill request. Renewal requires appointment.    Orders:  Orders Placed This Encounter  Procedures   Caudal Epidural Injection    Standing Status:   Future    Standing Expiration Date:   05/30/2021    Scheduling Instructions:     Laterality: Midline     Level(s): Sacrococcygeal canal (Tailbone area)     Sedation: Patient's choice     Scheduling Timeframe: As soon as pre-approved    Order Specific Question:   Where will this procedure be performed?    Answer:   [facility]   Follow-up plan:   Return for Procedure (w/ sedation): (ML) Caudal ESI #1 + epidurogram.     Interventional Therapies  Risk  Complexity Considerations:   Estimated body mass index is 29.63 kg/m as calculated from the following:   Height as of this encounter: 5' 2"  (1.575 m).   Weight as of this encounter: 162 lb (73.5 kg). WNL   Planned  Pending:   Diagnostic right caudal ESI #1 + diagnostic epidurogram    Under consideration:   Possible  candidate for right-sided Racz procedure  Diagnostic right lumbar facet block  Possible candidate for right thoracolumbar spinal cord stimulator trial/implant    Completed:   Diagnostic right SI joint block x1 (04/16/2021)    Completed by Dr. Sharlet Salina: Right L5-S1 TFESI x1 (12/07/2019)  Right S1 tTFESI x1 (12/07/2019)    Therapeutic  Palliative (PRN) options:   None established      Recent Visits Date Type Provider Dept  04/16/21 Procedure visit Milinda Pointer, MD [facility]  04/09/21 Office Visit Milinda Pointer, MD [facility]  03/13/21 Office Visit Milinda Pointer, MD [facility]  Showing recent visits within past 90 days and meeting all other requirements Today's Visits  Date Type Provider Dept  04/30/21 Office Visit Milinda Pointer, MD [facility]  Showing today's visits and meeting all other requirements Future Appointments No visits were found meeting these conditions. Showing future appointments within next 90 days and meeting all other requirements I discussed the assessment and treatment plan with the patient. The patient was provided an opportunity to ask questions and all were answered. The patient agreed with the plan and demonstrated an understanding of the instructions.  Patient advised to call back or seek an in-person evaluation if the symptoms or condition worsens.  Duration of encounter: 40 minutes.  Note by: Gaspar Cola, MD Date: 04/30/2021; Time: 7:31 PM

## 2021-04-30 NOTE — Patient Instructions (Signed)
____________________________________________________________________________________________  Preparing for Procedure with Sedation  Procedure appointments are limited to planned procedures: No Prescription Refills. No disability issues will be discussed. No medication changes will be discussed.  Instructions: Oral Intake: Do not eat or drink anything for at least 8 hours prior to your procedure. (Exception: Blood Pressure Medication. See below.) Transportation: Unless otherwise stated by your physician, you may drive yourself after the procedure. Blood Pressure Medicine: Do not forget to take your blood pressure medicine with a sip of water the morning of the procedure. If your Diastolic (lower reading)is above 100 mmHg, elective cases will be cancelled/rescheduled. Blood thinners: These will need to be stopped for procedures. Notify our staff if you are taking any blood thinners. Depending on which one you take, there will be specific instructions on how and when to stop it. Diabetics on insulin: Notify the staff so that you can be scheduled 1st case in the morning. If your diabetes requires high dose insulin, take only  of your normal insulin dose the morning of the procedure and notify the staff that you have done so. Preventing infections: Shower with an antibacterial soap the morning of your procedure. Build-up your immune system: Take 1000 mg of Vitamin C with every meal (3 times a day) the day prior to your procedure. Antibiotics: Inform the staff if you have a condition or reason that requires you to take antibiotics before dental procedures. Pregnancy: If you are pregnant, call and cancel the procedure. Sickness: If you have a cold, fever, or any active infections, call and cancel the procedure. Arrival: You must be in the facility at least 30 minutes prior to your scheduled procedure. Children: Do not bring children with you. Dress appropriately: Bring dark clothing that you would  not mind if they get stained. Valuables: Do not bring any jewelry or valuables.  Reasons to call and reschedule or cancel your procedure: (Following these recommendations will minimize the risk of a serious complication.) Surgeries: Avoid having procedures within 2 weeks of any surgery. (Avoid for 2 weeks before or after any surgery). Flu Shots: Avoid having procedures within 2 weeks of a flu shots or . (Avoid for 2 weeks before or after immunizations). Barium: Avoid having a procedure within 7-10 days after having had a radiological study involving the use of radiological contrast. (Myelograms, Barium swallow or enema study). Heart attacks: Avoid any elective procedures or surgeries for the initial 6 months after a "Myocardial Infarction" (Heart Attack). Blood thinners: It is imperative that you stop these medications before procedures. Let us know if you if you take any blood thinner.  Infection: Avoid procedures during or within two weeks of an infection (including chest colds or gastrointestinal problems). Symptoms associated with infections include: Localized redness, fever, chills, night sweats or profuse sweating, burning sensation when voiding, cough, congestion, stuffiness, runny nose, sore throat, diarrhea, nausea, vomiting, cold or Flu symptoms, recent or current infections. It is specially important if the infection is over the area that we intend to treat. Heart and lung problems: Symptoms that may suggest an active cardiopulmonary problem include: cough, chest pain, breathing difficulties or shortness of breath, dizziness, ankle swelling, uncontrolled high or unusually low blood pressure, and/or palpitations. If you are experiencing any of these symptoms, cancel your procedure and contact your primary care physician for an evaluation.  Remember:  Regular Business hours are:  Monday to Thursday 8:00 AM to 4:00 PM  Provider's Schedule: Milinda Pointer, MD:  Procedure days: Tuesday and  Thursday 7:30 AM  to 4:00 PM  Gillis Santa, MD:  Procedure days: Monday and Wednesday 7:30 AM to 4:00 PM ____________________________________________________________________________________________  ____________________________________________________________________________________________  General Risks and Possible Complications  Patient Responsibilities: It is important that you read this as it is part of your informed consent. It is our duty to inform you of the risks and possible complications associated with treatments offered to you. It is your responsibility as a patient to read this and to ask questions about anything that is not clear or that you believe was not covered in this document.  Patient's Rights: You have the right to refuse treatment. You also have the right to change your mind, even after initially having agreed to have the treatment done. However, under this last option, if you wait until the last second to change your mind, you may be charged for the materials used up to that point.  Introduction: Medicine is not an Chief Strategy Officer. Everything in Medicine, including the lack of treatment(s), carries the potential for danger, harm, or loss (which is by definition: Risk). In Medicine, a complication is a secondary problem, condition, or disease that can aggravate an already existing one. All treatments carry the risk of possible complications. The fact that a side effects or complications occurs, does not imply that the treatment was conducted incorrectly. It must be clearly understood that these can happen even when everything is done following the highest safety standards.  No treatment: You can choose not to proceed with the proposed treatment alternative. The "PRO(s)" would include: avoiding the risk of complications associated with the therapy. The "CON(s)" would include: not getting any of the treatment benefits. These benefits fall under one of three categories: diagnostic;  therapeutic; and/or palliative. Diagnostic benefits include: getting information which can ultimately lead to improvement of the disease or symptom(s). Therapeutic benefits are those associated with the successful treatment of the disease. Finally, palliative benefits are those related to the decrease of the primary symptoms, without necessarily curing the condition (example: decreasing the pain from a flare-up of a chronic condition, such as incurable terminal cancer).  General Risks and Complications: These are associated to most interventional treatments. They can occur alone, or in combination. They fall under one of the following six (6) categories: no benefit or worsening of symptoms; bleeding; infection; nerve damage; allergic reactions; and/or death. No benefits or worsening of symptoms: In Medicine there are no guarantees, only probabilities. No healthcare provider can ever guarantee that a medical treatment will work, they can only state the probability that it may. Furthermore, there is always the possibility that the condition may worsen, either directly, or indirectly, as a consequence of the treatment. Bleeding: This is more common if the patient is taking a blood thinner, either prescription or over the counter (example: Goody Powders, Fish oil, Aspirin, Garlic, etc.), or if suffering a condition associated with impaired coagulation (example: Hemophilia, cirrhosis of the liver, low platelet counts, etc.). However, even if you do not have one on these, it can still happen. If you have any of these conditions, or take one of these drugs, make sure to notify your treating physician. Infection: This is more common in patients with a compromised immune system, either due to disease (example: diabetes, cancer, human immunodeficiency virus [HIV], etc.), or due to medications or treatments (example: therapies used to treat cancer and rheumatological diseases). However, even if you do not have one on  these, it can still happen. If you have any of these conditions, or take one of  these drugs, make sure to notify your treating physician. Nerve Damage: This is more common when the treatment is an invasive one, but it can also happen with the use of medications, such as those used in the treatment of cancer. The damage can occur to small secondary nerves, or to large primary ones, such as those in the spinal cord and brain. This damage may be temporary or permanent and it may lead to impairments that can range from temporary numbness to permanent paralysis and/or brain death. Allergic Reactions: Any time a substance or material comes in contact with our body, there is the possibility of an allergic reaction. These can range from a mild skin rash (contact dermatitis) to a severe systemic reaction (anaphylactic reaction), which can result in death. Death: In general, any medical intervention can result in death, most of the time due to an unforeseen complication. ____________________________________________________________________________________________

## 2021-04-30 NOTE — Progress Notes (Signed)
Nursing Pain Medication Assessment:  Safety precautions to be maintained throughout the outpatient stay will include: orient to surroundings, keep bed in low position, maintain call bell within reach at all times, provide assistance with transfer out of bed and ambulation.  Medication Inspection Compliance: Pill count conducted under aseptic conditions, in front of the patient. Neither the pills nor the bottle was removed from the patient's sight at any time. Once count was completed pills were immediately returned to the patient in their original bottle.  Medication: Hydrocodone/APAP Pill/Patch Count:  40 of 100 pills remain Pill/Patch Appearance: Markings consistent with prescribed medication Bottle Appearance: Standard pharmacy container. Clearly labeled. Filled Date: 06 / 13 / 2022 Last Medication intake:  Today

## 2021-05-09 ENCOUNTER — Other Ambulatory Visit: Payer: Self-pay

## 2021-05-09 MED ORDER — LEVONORGEST-ETH ESTRAD 91-DAY 0.15-0.03 &0.01 MG PO TABS
ORAL_TABLET | ORAL | 1 refills | Status: AC
  Filled 2021-05-09: qty 91, 91d supply, fill #0
  Filled 2021-08-14: qty 91, 91d supply, fill #1

## 2021-05-09 MED FILL — Ibuprofen Tab 800 MG: ORAL | 10 days supply | Qty: 30 | Fill #5 | Status: AC

## 2021-05-10 ENCOUNTER — Other Ambulatory Visit: Payer: Self-pay

## 2021-05-13 ENCOUNTER — Other Ambulatory Visit: Payer: Self-pay

## 2021-05-15 ENCOUNTER — Other Ambulatory Visit: Payer: Self-pay

## 2021-05-15 NOTE — Progress Notes (Signed)
PROVIDER NOTE: Information contained herein reflects review and annotations entered in association with encounter. Interpretation of such information and data should be left to medically-trained personnel. Information provided to patient can be located elsewhere in the medical record under "Patient Instructions". Document created using STT-dictation technology, any transcriptional errors that may result from process are unintentional.    Patient: Tammy Deleon  Service Category: Procedure  Provider: Gaspar Cola, MD  DOB: 11-Nov-1985  DOS: 05/16/2021  Location: [facility]  MRN: [id number]  Setting: Ambulatory - outpatient  Referring Provider: Adin Hector, MD  Type: Established Patient  Specialty: Interventional Pain Management  PCP: Adin Hector, MD   Primary Reason for Visit: Interventional Pain Management Treatment. CC: Back Pain (lower)  Procedure:          Anesthesia, Analgesia, Anxiolysis:  Type: Diagnostic Epidural Steroid Injection + Diagnostic Epidurogram #1  Region: Caudal Level: Sacrococcygeal   Laterality: Midline aiming at the right  Type: Minimal (Conscious) Anxiolysis combined with Local Anesthesia Indication(s): Analgesia and Anxiety Route:  Oral IV Access: Secured Sedation: Meaningful verbal contact was maintained at all times during the procedure  Local Anesthetic: Lidocaine 1-2%  Position: Prone   Indications: 1. Chronic low back pain (1ry area of Pain) (Right) w/o sciatica   2. Failed back surgical syndrome   3. Chronic lower extremity pain (intermittent) (2ry area of Pain) (Right)   4. Epidural fibrosis (S1) (Right)   5. DDD (degenerative disc disease), lumbosacral   6. Anxiety due to invasive procedure   7. Encounter for therapeutic procedure    Pain Score: Pre-procedure: 8 /10 Post-procedure: 3 /10   Pre-op H&P Assessment:  Tammy Deleon is a 35 y.o. (year old), female patient, seen today for interventional treatment. She   has a past surgical history that includes Gallbladder surgery; Hemi-microdiscectomy lumbar laminectomy level 1 (Right, 01/02/2020); and Hemi-microdiscectomy lumbar laminectomy level 1 (Right, 11/05/2020). Tammy Deleon has a current medication list which includes the following prescription(s): amphetamine-dextroamphetamine, hydrocodone-acetaminophen, [START ON 06/09/2021] hydrocodone-acetaminophen, ibuprofen, lamotrigine, lamotrigine, levonorgestrel-ethinyl estradiol, levonorgestrel-ethinyl estradiol, trazodone, trazodone, valacyclovir, and phentermine. Her primarily concern today is the Back Pain (lower)  Initial Vital Signs:  Pulse/HCG Rate: 90ECG Heart Rate: 87 Temp: (!) 97.2 F (36.2 C) Resp: 16 BP: 128/84 SpO2: 100 %  BMI: Estimated body mass index is 30.18 kg/m as calculated from the following:   Height as of this encounter: 5' 2"  (1.575 m).   Weight as of this encounter: 165 lb (74.8 kg).  Risk Assessment: Allergies: Reviewed. She is allergic to sulfa antibiotics.  Allergy Precautions: None required Coagulopathies: Reviewed. None identified.  Blood-thinner therapy: None at this time Active Infection(s): Reviewed. None identified. Tammy Deleon is afebrile  Site Confirmation: Tammy Deleon was asked to confirm the procedure and laterality before marking the site Procedure checklist: Completed Consent: Before the procedure and under the influence of no sedative(s), amnesic(s), or anxiolytics, the patient was informed of the treatment options, risks and possible complications. To fulfill our ethical and legal obligations, as recommended by the American Medical Association's Code of Ethics, I have informed the patient of my clinical impression; the nature and purpose of the treatment or procedure; the risks, benefits, and possible complications of the intervention; the alternatives, including doing nothing; the risk(s) and benefit(s) of the alternative treatment(s) or procedure(s); and the risk(s)  and benefit(s) of doing nothing. The patient was provided information about the general risks and possible complications associated with the procedure. These may include, but  are not limited to: failure to achieve desired goals, infection, bleeding, organ or nerve damage, allergic reactions, paralysis, and death. In addition, the patient was informed of those risks and complications associated to Spine-related procedures, such as failure to decrease pain; infection (i.e.: Meningitis, epidural or intraspinal abscess); bleeding (i.e.: epidural hematoma, subarachnoid hemorrhage, or any other type of intraspinal or peri-dural bleeding); organ or nerve damage (i.e.: Any type of peripheral nerve, nerve root, or spinal cord injury) with subsequent damage to sensory, motor, and/or autonomic systems, resulting in permanent pain, numbness, and/or weakness of one or several areas of the body; allergic reactions; (i.e.: anaphylactic reaction); and/or death. Furthermore, the patient was informed of those risks and complications associated with the medications. These include, but are not limited to: allergic reactions (i.e.: anaphylactic or anaphylactoid reaction(s)); adrenal axis suppression; blood sugar elevation that in diabetics may result in ketoacidosis or comma; water retention that in patients with history of congestive heart failure may result in shortness of breath, pulmonary edema, and decompensation with resultant heart failure; weight gain; swelling or edema; medication-induced neural toxicity; particulate matter embolism and blood vessel occlusion with resultant organ, and/or nervous system infarction; and/or aseptic necrosis of one or more joints. Finally, the patient was informed that Medicine is not an exact science; therefore, there is also the possibility of unforeseen or unpredictable risks and/or possible complications that may result in a catastrophic outcome. The patient indicated having understood very  clearly. We have given the patient no guarantees and we have made no promises. Enough time was given to the patient to ask questions, all of which were answered to the patient's satisfaction. Ms. Ghuman has indicated that she wanted to continue with the procedure. Attestation: I, the ordering provider, attest that I have discussed with the patient the benefits, risks, side-effects, alternatives, likelihood of achieving goals, and potential problems during recovery for the procedure that I have provided informed consent. Date  Time: 05/16/2021  8:10 AM  Pre-Procedure Preparation:  Monitoring: As per clinic protocol. Respiration, ETCO2, SpO2, BP, heart rate and rhythm monitor placed and checked for adequate function Safety Precautions: Patient was assessed for positional comfort and pressure points before starting the procedure. Time-out: I initiated and conducted the "Time-out" before starting the procedure, as per protocol. The patient was asked to participate by confirming the accuracy of the "Time Out" information. Verification of the correct person, site, and procedure were performed and confirmed by me, the nursing staff, and the patient. "Time-out" conducted as per Joint Commission's Universal Protocol (UP.01.01.01). Time: 0915  Description of Procedure:          Target Area: Caudal Epidural Canal. Approach: Midline approach. Area Prepped: Entire Posterior Sacrococcygeal Region DuraPrep (Iodine Povacrylex [0.7% available iodine] and Isopropyl Alcohol, 74% w/w) Safety Precautions: Aspiration looking for blood return was conducted prior to all injections. At no point did we inject any substances, as a needle was being advanced. No attempts were made at seeking any paresthesias. Safe injection practices and needle disposal techniques used. Medications properly checked for expiration dates. SDV (single dose vial) medications used. Description of the Procedure: Protocol guidelines were followed.  The patient was placed in position over the fluoroscopy table. The target area was identified and the area prepped in the usual manner. Skin & deeper tissues infiltrated with local anesthetic. Appropriate amount of time allowed to pass for local anesthetics to take effect. The procedure needles were then advanced to the target area. Proper needle placement secured. Negative aspiration confirmed. Solution injected  in intermittent fashion, asking for systemic symptoms every 0.5cc of injectate. The needles were then removed and the area cleansed, making sure to leave some of the prepping solution back to take advantage of its long term bactericidal properties. Vitals:   05/16/21 0915 05/16/21 0919 05/16/21 0922 05/16/21 0938  BP: (!) 125/100 (!) 136/105 (!) 137/103 (!) 128/99  Pulse:      Resp: 18 18 16 15   Temp:    (!) 97.3 F (36.3 C)  TempSrc:      SpO2: 100% 100% 100% 100%  Weight:      Height:        Start Time: 0915 hrs. End Time: 0922 hrs. Materials:  Needle(s) Type: Epidural needle Gauge: 17G Length: 3.5-in Medication(s): Please see orders for medications and dosing details.  Imaging Guidance (Spinal):          Type of Imaging Technique: Fluoroscopy Guidance (Spinal) Indication(s): Assistance in needle guidance and placement for procedures requiring needle placement in or near specific anatomical locations not easily accessible without such assistance. Exposure Time: Please see nurses notes. Contrast: Before injecting any contrast, we confirmed that the patient did not have an allergy to iodine, shellfish, or radiological contrast. Once satisfactory needle placement was completed at the desired level, radiological contrast was injected. Contrast injected under live fluoroscopy. No contrast complications. See chart for type and volume of contrast used. Fluoroscopic Guidance: I was personally present during the use of fluoroscopy. "Tunnel Vision Technique" used to obtain the best  possible view of the target area. Parallax error corrected before commencing the procedure. "Direction-depth-direction" technique used to introduce the needle under continuous pulsed fluoroscopy. Once target was reached, antero-posterior, oblique, and lateral fluoroscopic projection used confirm needle placement in all planes. Images permanently stored in EMR. Interpretation: I personally interpreted the imaging intraoperatively. Adequate needle placement confirmed in multiple planes. Appropriate spread of contrast into desired area was observed. No evidence of afferent or efferent intravascular uptake. No intrathecal or subarachnoid spread observed. Permanent images saved into the patient's record.  Diagnostic Epidurogram:  Contrast: Before injecting any contrast, we confirmed that the patient did not have an allergy to iodine, shellfish, or radiological contrast. For accuracy purposes, contrast was injected under live fluoroscopy. Study personally interpreted intraoparatively. Type: Non-ionic, water soluble, hypoallergenic, myelogram-compatible, radiological contrast used. Please see orders and nurses note for specific choice of contrast. Volume: Please see nurses note for injected volume.  Observations:  Spinal Alignment: Adequate       Vertebral body: Intact Lamina: Intact Disc: Disc hight preserved Facet: Within Normal Limits.        Hardware: None  Spread: Appropriate epidural spread of contrast Anterior: Adequate Posterior: Adequate Superior (cephalad): Adequate Inferior (caudad): Adequate Right lateral: Adequate Left lateral: Adequate Plica medialis dorsalis: Medially aligned Nerve root(s): Adequate  Epidural extravasation: None observed Intrathecal: No intrathecal spread identified Subarachnoid: No subarachnoid spread pattern observed Vascular: No evidence of afferent or efferent intravascular uptake  Impression: Technically successful epidurogram. See above for details Note:  Hard copies saved to EMR.  Antibiotic Prophylaxis:   Anti-infectives (From admission, onward)    None      Indication(s): None identified  Post-operative Assessment:  Post-procedure Vital Signs:  Pulse/HCG Rate: 9084 Temp: (!) 97.3 F (36.3 C) Resp: 15 BP: (!) 128/99 SpO2: 100 %  EBL: None  Complications: No immediate post-treatment complications observed by team, or reported by patient.  Note: The patient tolerated the entire procedure well. A repeat set of vitals were taken after the  procedure and the patient was kept under observation following institutional policy, for this type of procedure. Post-procedural neurological assessment was performed, showing return to baseline, prior to discharge. The patient was provided with post-procedure discharge instructions, including a section on how to identify potential problems. Should any problems arise concerning this procedure, the patient was given instructions to immediately contact us, at any time, without hesitation. In any case, we plan to contact the patient by telephone for a follow-up status report regarding this interventional procedure.  Comments:  No additional relevant information.  Plan of Care  Orders:  Orders Placed This Encounter  Procedures   Caudal Epidural Injection    Scheduling Instructions:     Laterality: Right-sided     Level(s): Sacrococcygeal canal (Tailbone area)     Sedation: Patient's choice     Timeframe: Today    Order Specific Question:   Where will this procedure be performed?    Answer:   [facility]   DG [facility] C-ARM 1-60 MIN NO REPORT    Intraoperative interpretation by procedural physician at [place].    Standing Status:   Standing    Number of Occurrences:   1    Order Specific Question:   Reason for exam:    Answer:   Assistance in needle guidance and placement for procedures requiring needle placement in or near specific anatomical locations not easily  accessible without such assistance.   Informed Consent Details: Physician/Practitioner Attestation; Transcribe to consent form and obtain patient signature    Nursing Order: Transcribe to consent form and obtain patient signature. Note: Always confirm laterality of pain with Ms. Hardin Negus, before procedure.    Order Specific Question:   Physician/Practitioner attestation of informed consent for procedure/surgical case    Answer:   I, the physician/practitioner, attest that I have discussed with the patient the benefits, risks, side effects, alternatives, likelihood of achieving goals and potential problems during recovery for the procedure that I have provided informed consent.    Order Specific Question:   Procedure    Answer:   Caudal epidural steroid injection    Order Specific Question:   Physician/Practitioner performing the procedure    Answer:   Christyn Gutkowski A. Dossie Arbour, MD    Order Specific Question:   Indication/Reason    Answer:   Low back pain and lower extremity pain secondary to lumbosacral radiculitis   Provide equipment / supplies at bedside    "Epidural Tray" (Disposable  single use) Catheter: NOT required    Standing Status:   Standing    Number of Occurrences:   1    Order Specific Question:   Specify    Answer:   Epidural Tray   Saline lock IV    (Routine) Please secure IV access as safety precaution for rapid administration of medications in case of adverse intra-procedural reactions or events. Discontinue prior to discharge, but no sooner than 15 min after procedure.    Standing Status:   Standing    Number of Occurrences:   1    Chronic Opioid Analgesic:  Hydrocodone/APAP 5/325, 1 tab p.o. 4 times daily (#56) (filled 02/27/2021) MME/day: 20 mg/day   Medications ordered for procedure: Meds ordered this encounter  Medications   diazepam (VALIUM) tablet 10 mg    Make sure Flumazenil is available in the pyxis when using this medication. If oversedation occurs, administer  0.2 mg IV over 15 sec. If after 45 sec no response, administer 0.2 mg again over 1  min; may repeat at 1 min intervals; not to exceed 4 doses (1 mg)   iohexol (OMNIPAQUE) 180 MG/ML injection 10 mL    Must be Myelogram-compatible. If not available, you may substitute with a water-soluble, non-ionic, hypoallergenic, myelogram-compatible radiological contrast medium.   lidocaine (XYLOCAINE) 2 % (with pres) injection 400 mg   sodium chloride flush (NS) 0.9 % injection 2 mL   ropivacaine (PF) 2 mg/mL (0.2%) (NAROPIN) injection 2 mL   triamcinolone acetonide (KENALOG-40) injection 40 mg    Medications administered: We administered diazepam, iohexol, lidocaine, sodium chloride flush, ropivacaine (PF) 2 mg/mL (0.2%), and triamcinolone acetonide.  See the medical record for exact dosing, route, and time of administration.  Follow-up plan:   Return in about 2 weeks (around 05/30/2021) for procedure day (afternoon F2F) (PPE).      Interventional Therapies  Risk  Complexity Considerations:   Estimated body mass index is 29.63 kg/m as calculated from the following:   Height as of this encounter: 5' 2"  (1.575 m).   Weight as of this encounter: 162 lb (73.5 kg). WNL   Planned  Pending:   Diagnostic right caudal ESI #1 + diagnostic epidurogram    Under consideration:   Possible candidate for right-sided Racz procedure  Diagnostic right lumbar facet block  Possible candidate for right thoracolumbar spinal cord stimulator trial/implant    Completed:   Diagnostic right SI joint block x1 (04/16/2021)    Completed by Dr. Sharlet Salina: Right L5-S1 TFESI x1 (12/07/2019)  Right S1 tTFESI x1 (12/07/2019)    Therapeutic  Palliative (PRN) options:   None established    Recent Visits Date Type Provider Dept  04/30/21 Office Visit Milinda Pointer, MD [facility]  04/16/21 Procedure visit Milinda Pointer, MD [facility]  04/09/21 Office Visit Milinda Pointer, MD [facility]  03/13/21 Office Visit Milinda Pointer, MD [facility]  Showing recent visits within past 90 days and meeting all other requirements Today's Visits Date Type Provider Dept  05/16/21 Procedure visit Milinda Pointer, MD [facility]  Showing today's visits and meeting all other requirements Future Appointments Date Type Provider Dept  05/30/21 Appointment Milinda Pointer, MD [facility]  07/17/21 Appointment Milinda Pointer, MD [facility]  Showing future appointments within next 90 days and meeting all other requirements Disposition: Discharge home  Discharge (Date  Time): 05/16/2021; 0940 hrs.   Primary Care Physician: Adin Hector, MD Location: [facility] Note by: Gaspar Cola, MD Date: 05/16/2021; Time: 7:16 PM  Disclaimer:  Medicine is not an Chief Strategy Officer. The only guarantee in medicine is that nothing is guaranteed. It is important to note that the decision to proceed with this intervention was based on the information collected from the patient. The Data and conclusions were drawn from the patient's questionnaire, the interview, and the physical examination. Because the information was provided in large part by the patient, it cannot be guaranteed that it has not been purposely or unconsciously manipulated. Every effort has been made to obtain as much relevant data as possible for this evaluation. It is important to note that the conclusions that lead to this procedure are derived in large part from the available data. Always take into account that the treatment will also be dependent on availability of resources and existing treatment guidelines, considered by other Pain Management Practitioners as being common knowledge and practice, at the time of the intervention. For Medico-Legal purposes, it is also important  to point out that variation in procedural techniques and pharmacological  choices are the acceptable norm. The indications, contraindications, technique, and results of the above procedure should only be interpreted and judged by a Board-Certified Interventional Pain Specialist with extensive familiarity and expertise in the same exact procedure and technique.

## 2021-05-16 ENCOUNTER — Ambulatory Visit (HOSPITAL_BASED_OUTPATIENT_CLINIC_OR_DEPARTMENT_OTHER): Payer: 59 | Admitting: Pain Medicine

## 2021-05-16 ENCOUNTER — Ambulatory Visit
Admission: RE | Admit: 2021-05-16 | Discharge: 2021-05-16 | Disposition: A | Discharge status: Home / Self Care | Payer: 59 | Source: Ambulatory Visit | Attending: Pain Medicine | Admitting: Pain Medicine

## 2021-05-16 ENCOUNTER — Encounter: Payer: Self-pay | Admitting: Pain Medicine

## 2021-05-16 ENCOUNTER — Other Ambulatory Visit: Payer: Self-pay

## 2021-05-16 VITALS — BP 128/99 | HR 90 | Temp 97.3°F | Resp 15 | Ht 62.0 in | Wt 165.0 lb

## 2021-05-16 DIAGNOSIS — M5137 Other intervertebral disc degeneration, lumbosacral region: Secondary | ICD-10-CM | POA: Diagnosis not present

## 2021-05-16 DIAGNOSIS — M961 Postlaminectomy syndrome, not elsewhere classified: Secondary | ICD-10-CM

## 2021-05-16 DIAGNOSIS — G96198 Other disorders of meninges, not elsewhere classified: Secondary | ICD-10-CM | POA: Insufficient documentation

## 2021-05-16 DIAGNOSIS — G8929 Other chronic pain: Secondary | ICD-10-CM | POA: Diagnosis not present

## 2021-05-16 DIAGNOSIS — M545 Low back pain, unspecified: Secondary | ICD-10-CM | POA: Insufficient documentation

## 2021-05-16 DIAGNOSIS — M79604 Pain in right leg: Secondary | ICD-10-CM | POA: Insufficient documentation

## 2021-05-16 DIAGNOSIS — Z5189 Encounter for other specified aftercare: Secondary | ICD-10-CM | POA: Diagnosis not present

## 2021-05-16 DIAGNOSIS — F419 Anxiety disorder, unspecified: Secondary | ICD-10-CM | POA: Diagnosis not present

## 2021-05-16 DIAGNOSIS — M51379 Other intervertebral disc degeneration, lumbosacral region without mention of lumbar back pain or lower extremity pain: Secondary | ICD-10-CM

## 2021-05-16 MED ORDER — IOHEXOL 180 MG/ML  SOLN
10.0000 mL | Freq: Once | INTRAMUSCULAR | Status: AC
  Administered 2021-05-16: 5 mL via EPIDURAL

## 2021-05-16 MED ORDER — SODIUM CHLORIDE 0.9% FLUSH
2.0000 mL | Freq: Once | INTRAVENOUS | Status: AC
  Administered 2021-05-16: 2 mL

## 2021-05-16 MED ORDER — ROPIVACAINE HCL 2 MG/ML IJ SOLN
2.0000 mL | Freq: Once | INTRAMUSCULAR | Status: AC
  Administered 2021-05-16: 2 mL via EPIDURAL

## 2021-05-16 MED ORDER — SODIUM CHLORIDE (PF) 0.9 % IJ SOLN
INTRAMUSCULAR | Status: AC
  Filled 2021-05-16: qty 10

## 2021-05-16 MED ORDER — LIDOCAINE HCL 2 % IJ SOLN
20.0000 mL | Freq: Once | INTRAMUSCULAR | Status: AC
  Administered 2021-05-16: 200 mg
  Filled 2021-05-16: qty 20

## 2021-05-16 MED ORDER — DIAZEPAM 5 MG PO TABS
10.0000 mg | ORAL_TABLET | Freq: Once | ORAL | Status: AC
  Administered 2021-05-16: 10 mg via ORAL
  Filled 2021-05-16: qty 2

## 2021-05-16 MED ORDER — TRIAMCINOLONE ACETONIDE 40 MG/ML IJ SUSP
40.0000 mg | Freq: Once | INTRAMUSCULAR | Status: AC
Start: 2021-05-16 — End: 2021-05-16
  Administered 2021-05-16: 40 mg
  Filled 2021-05-16: qty 1

## 2021-05-16 MED FILL — Diazepam Tab 5 MG: ORAL | Qty: 2 | Status: AC

## 2021-05-16 NOTE — Patient Instructions (Signed)

## 2021-05-16 NOTE — Progress Notes (Signed)
Safety precautions to be maintained throughout the outpatient stay will include: orient to surroundings, keep bed in low position, maintain call bell within reach at all times, provide assistance with transfer out of bed and ambulation.  

## 2021-05-17 ENCOUNTER — Telehealth: Payer: Self-pay

## 2021-05-17 NOTE — Telephone Encounter (Signed)
PoSt procedure phone call.  Patient states she is doing good.

## 2021-05-20 ENCOUNTER — Telehealth: Payer: Self-pay | Admitting: Pain Medicine

## 2021-05-20 NOTE — Telephone Encounter (Signed)
Patient states that she is feeling sore at the site where she had her caudal ESI on 07/14. Denies any HA, fever, redness at site or any other S/S infection. Got good short term relief but pain started the next day. Instructed to watch for S/S infection and to call us if worsens/new symptoms. Patient understands instructions.

## 2021-05-22 ENCOUNTER — Other Ambulatory Visit: Payer: Self-pay

## 2021-05-22 MED FILL — Ibuprofen Tab 800 MG: ORAL | 10 days supply | Qty: 30 | Fill #6 | Status: AC

## 2021-05-27 NOTE — Progress Notes (Signed)
PROVIDER NOTE: Information contained herein reflects review and annotations entered in association with encounter. Interpretation of such information and data should be left to medically-trained personnel. Information provided to patient can be located elsewhere in the medical record under "Patient Instructions". Document created using STT-dictation technology, any transcriptional errors that may result from process are unintentional.    Patient: Tammy Deleon  Service Category: E/M  Provider: Gaspar Cola, MD  DOB: 02-21-1986  DOS: 05/30/2021  Specialty: Interventional Pain Management  MRN: [id number]  Setting: Ambulatory outpatient  PCP: Adin Hector, MD  Type: Established Patient    Referring Provider: Adin Hector, MD  Location: Office  Delivery: Face-to-face     HPI  [name], a 35 y.o. year old female, is here today because of her Chronic right-sided low back pain without sciatica [M54.50, G89.29]. Ms. Ginsberg primary complain today is Back Pain (lower) Last encounter: My last encounter with her was on 05/20/2021. Pertinent problems: Ms. Leventhal has Headache disorder; Lumbar disc disease; Chronic pain syndrome; Failed back surgical syndrome; Chronic low back pain (1ry area of Pain) (Right) w/o sciatica; Epidural fibrosis (S1) (Right); Lumbar facet joint syndrome (Right); Chronic hip pain (Right); Abnormal MRI, lumbar spine (02/04/2021); Lumbosacral facet arthropathy (L5-S1); Lumbosacral lateral recess stenosis (L5-S1) (Bilateral); History of lumbar laminectomy x2; Chronic sacroiliac joint pain (Right); Somatic dysfunction of sacroiliac joint (Right); Other spondylosis, sacral and sacrococcygeal region; Enthesopathy of sacroiliac joint (Right); Chronic lower extremity pain (intermittent) (2ry area of Pain) (Right); and DDD (degenerative disc disease), lumbosacral on their pertinent problem list. Pain Assessment: Severity of Chronic pain is reported as a 6 /10.  Location: Back Lower/denies. Onset: More than a month ago. Quality: Aching, Dull, Sharp, Stabbing. Timing: Constant. Modifying factor(s): Hydrocodone. Vitals:  height is 5' 2"  (1.575 m) and weight is 164 lb (74.4 kg). Her temporal temperature is 96.2 F (35.7 C) (abnormal). Her blood pressure is 137/99 (abnormal) and her pulse is 94. Her respiration is 16 and oxygen saturation is 99%.   Reason for encounter: both, medication management and post-procedure assessment.   The patient indicates doing well with the current medication regimen. No adverse reactions or side effects reported to the medications.   The patient indicates having attained 100% relief of the pain for the duration of the local anesthetic which then went away once the local wore off.  Today I went over the fluoroscopy films from the procedure where I showed the patient the flow of the contrast to the left side, opposite to where her pain is located.  This would strongly suggest the presence of epidural fibrosis around the L5/S1 nerve roots.  Today we went over the possible alternatives and I have proposed to the patient to proceed with a Racz procedure aiming at the right side for the purpose of lysis of epidural adhesions.  I went ahead and explained the procedure to the patient and she has understood and agreed with it.  We will be doing the procedure providing the patient with some anxiolysis in the form of oral Valium.  Last time we went ahead and gave her 10 mg of Valium but it was given to the patient in the clinic and I do not think that it provided her with enough time to properly work.  The patient indicated that she did not really feel that much of a benefit.  RTCB: 07/09/2021 (07/17/2021) will need to be rescheduled.  Post-Procedure Evaluation  Procedure (05/16/2021):  Type: Diagnostic Epidural  Steroid Injection + Diagnostic Epidurogram #1  Region: Caudal Level: Sacrococcygeal   Laterality: Midline aiming at the  right  Pre-procedure pain level: 8/10 Post-procedure: 3/10 (> 50% relief)  Anxiolysis: oral.  Effectiveness during initial hour after procedure (Ultra-Short Term Relief): 100 %.  Local anesthetic used: Long-acting (4-6 hours) Effectiveness: Defined as any analgesic benefit obtained secondary to the administration of local anesthetics. This carries significant diagnostic value as to the etiological location, or anatomical origin, of the pain. Duration of benefit is expected to coincide with the duration of the local anesthetic used.  Effectiveness during initial 4-6 hours after procedure (Short-Term Relief): 100 %.  Long-term benefit: Defined as any relief past the pharmacologic duration of the local anesthetics.  Effectiveness past the initial 6 hours after procedure (Long-Term Relief): 0 %.  Benefits, current: Defined as benefit present at the time of this evaluation.   Analgesia:  Back to baseline Function: Back to baseline ROM: Back to baseline  Pharmacotherapy Assessment  Analgesic: Hydrocodone/APAP 5/325, 1 tab p.o. 4 times daily (#56) (filled 02/27/2021) MME/day: 20 mg/day   Monitoring: [place] PMP: PDMP reviewed during this encounter.       Pharmacotherapy: No side-effects or adverse reactions reported. Compliance: No problems identified. Effectiveness: Clinically acceptable.  No notes on file  UDS:  Summary  Date Value Ref Range Status  03/13/2021 Note  Final    Comment:    ==================================================================== Compliance Drug Analysis, Ur ==================================================================== Test                             Result       Flag       Units  Drug Present and Declared for Prescription Verification   Hydrocodone                    598          EXPECTED   ng/mg creat   Hydromorphone                  38           EXPECTED   ng/mg creat   Dihydrocodeine                 139          EXPECTED   ng/mg creat    Norhydrocodone                 504          EXPECTED   ng/mg creat    Sources of hydrocodone include scheduled prescription medications.    Hydromorphone, dihydrocodeine and norhydrocodone are expected    metabolites of hydrocodone. Hydromorphone and dihydrocodeine are    also available as scheduled prescription medications.    Lamotrigine                    PRESENT      EXPECTED   Trazodone                      PRESENT      EXPECTED   1,3 chlorophenyl piperazine    PRESENT      EXPECTED    1,3-chlorophenyl piperazine is an expected metabolite of trazodone.    Acetaminophen                  PRESENT      EXPECTED   Ibuprofen  PRESENT      EXPECTED  Drug Present not Declared for Prescription Verification   Tramadol                       >3759        UNEXPECTED ng/mg creat   O-Desmethyltramadol            >3759        UNEXPECTED ng/mg creat   N-Desmethyltramadol            1017         UNEXPECTED ng/mg creat    Source of tramadol is a prescription medication. O-desmethyltramadol    and N-desmethyltramadol are expected metabolites of tramadol.    Naproxen                       PRESENT      UNEXPECTED  Drug Absent but Declared for Prescription Verification   Amphetamine                    Not Detected UNEXPECTED ng/mg creat   Phentermine                    Not Detected UNEXPECTED ==================================================================== Test                      Result    Flag   Units      Ref Range   Creatinine              133              mg/dL      >=20 ==================================================================== Declared Medications:  The flagging and interpretation on this report are based on the  following declared medications.  Unexpected results may arise from  inaccuracies in the declared medications.   **Note: The testing scope of this panel includes these medications:   Amphetamine (Adderall)  Hydrocodone (Norco)  Lamotrigine  (Lamictal)  Phentermine (Adipex)  Trazodone (Desyrel)   **Note: The testing scope of this panel does not include small to  moderate amounts of these reported medications:   Acetaminophen (Norco)  Ibuprofen (Advil)   **Note: The testing scope of this panel does not include the  following reported medications:   Ethinyl Estradiol  Levonorgestrel  Valacyclovir (Valtrex) ==================================================================== For clinical consultation, please call [phone]. ====================================================================      ROS  Constitutional: Denies any fever or chills Gastrointestinal: No reported hemesis, hematochezia, vomiting, or acute GI distress Musculoskeletal: Denies any acute onset joint swelling, redness, loss of ROM, or weakness Neurological: No reported episodes of acute onset apraxia, aphasia, dysarthria, agnosia, amnesia, paralysis, loss of coordination, or loss of consciousness  Medication Review  HYDROcodone-acetaminophen, Levonorgestrel-Ethinyl Estradiol, amphetamine-dextroamphetamine, diazepam, ibuprofen, lamoTRIgine, phentermine, traZODone, and valACYclovir  History Review  Allergy: Ms. Goerke is allergic to sulfa antibiotics. Drug: Ms. Sagun  reports no history of drug use. Alcohol:  reports current alcohol use. Tobacco:  reports that she has never smoked. She has never used smokeless tobacco. Social: Ms. Plant  reports that she has never smoked. She has never used smokeless tobacco. She reports current alcohol use. She reports that she does not use drugs. Medical:  has a past medical history of Anxiety, Depression, Irregular intermenstrual bleeding, and UTI (lower urinary tract infection). Surgical: Ms. Bossard  has a past surgical history that includes Gallbladder surgery; Hemi-microdiscectomy lumbar laminectomy level 1 (Right, 01/02/2020); and Hemi-microdiscectomy lumbar laminectomy level  1 (Right,  11/05/2020). Family: family history is not on file.  Laboratory Chemistry Profile   Renal Lab Results  Component Value Date   BUN 15 03/13/2021   CREATININE 0.89 03/13/2021   LABCREA 208 09/21/2019   BCR 17 03/13/2021   GFRAA >60 12/30/2019   GFRNONAA >60 10/29/2020    Hepatic Lab Results  Component Value Date   AST 18 03/13/2021   ALT 11 09/22/2019   ALBUMIN 4.4 03/13/2021   ALKPHOS 67 03/13/2021   LIPASE 80 03/17/2012    Electrolytes Lab Results  Component Value Date   NA 139 03/13/2021   K 4.7 03/13/2021   CL 103 03/13/2021   CALCIUM 9.9 03/13/2021   MG 1.9 03/13/2021    Bone Lab Results  Component Value Date   25OHVITD1 50 03/13/2021   25OHVITD2 <1.0 03/13/2021   25OHVITD3 50 03/13/2021    Inflammation (CRP: Acute Phase) (ESR: Chronic Phase) Lab Results  Component Value Date   CRP 9 03/13/2021   ESRSEDRATE 14 03/13/2021         Note: Above Lab results reviewed.  Recent Imaging Review  DG PAIN CLINIC C-ARM 1-60 MIN NO REPORT Fluoro was used, but no Radiologist interpretation will be provided.  Please refer to "NOTES" tab for provider progress note. Note: Reviewed          Physical Exam  General appearance: Well nourished, well developed, and well hydrated. In no apparent acute distress Mental status: Alert, oriented x 3 (person, place, & time)       Respiratory: No evidence of acute respiratory distress Eyes: PERLA Vitals: BP (!) 137/99   Pulse 94   Temp (!) 96.2 F (35.7 C) (Temporal)   Resp 16   Ht 5' 2"  (1.575 m)   Wt 164 lb (74.4 kg)   SpO2 99%   BMI 30.00 kg/m  BMI: Estimated body mass index is 30 kg/m as calculated from the following:   Height as of this encounter: 5' 2"  (1.575 m).   Weight as of this encounter: 164 lb (74.4 kg). Ideal: Ideal body weight: 50.1 kg (110 lb 7.2 oz) Adjusted ideal body weight: 59.8 kg (131 lb 13.9 oz)  Assessment   Status Diagnosis  Controlled Controlled Controlled 1. Chronic low back pain (1ry  area of Pain) (Right) w/o sciatica   2. Chronic lower extremity pain (intermittent) (2ry area of Pain) (Right)   3. Epidural fibrosis (S1) (Right)   4. DDD (degenerative disc disease), lumbosacral   5. Failed back surgical syndrome   6. History of lumbar laminectomy x2   7. Lumbosacral lateral recess stenosis (L5-S1) (Bilateral)   8. Abnormal MRI, lumbar spine (02/04/2021)      Updated Problems: No problems updated.  Plan of Care  Problem-specific:  No problem-specific Assessment & Plan notes found for this encounter.  Ms. KIT BRUBACHER has a current medication list which includes the following long-term medication(s): amphetamine-dextroamphetamine, [START ON 06/09/2021] hydrocodone-acetaminophen, lamotrigine, lamotrigine, levonorgestrel-ethinyl estradiol, trazodone, trazodone, levonorgestrel-ethinyl estradiol, and phentermine.  Pharmacotherapy (Medications Ordered): Meds ordered this encounter  Medications   diazepam (VALIUM) 10 MG tablet    Sig: Take 1 tablet (10 mg total) by mouth 60 (sixty) minutes before procedure for 1 dose. Take with a sip of water, on an empty stomach. Do not eat anything for 6 hours prior to procedure.    Dispense:  1 tablet    Refill:  0    Orders:  Orders Placed This Encounter  Procedures   Racz Epidurolysis  Standing Status:   Future    Standing Expiration Date:   05/30/2022    Scheduling Instructions:     Procedure: RACZ Epidural Lysis of Adhesions     Side: Midline     Sedation: Moderate Conscious Sedation     Timeframe: ASAA    Order Specific Question:   Where will this procedure be performed?    Answer:   [facility]   Informed Consent Details: Physician/Practitioner Attestation; Transcribe to consent form and obtain patient signature    Nursing Order: Transcribe to consent form and obtain patient signature. Note: Always confirm laterality of pain with Ms. Hardin Negus, before procedure.    Order Specific Question:    Physician/Practitioner attestation of informed consent for procedure/surgical case    Answer:   I, the physician/practitioner, attest that I have discussed with the patient the benefits, risks, side effects, alternatives, likelihood of achieving goals and potential problems during recovery for the procedure that I have provided informed consent.    Order Specific Question:   Procedure    Answer:   RACZ Procedure (Epidural Lysis of Adhesions)    Order Specific Question:   Physician/Practitioner performing the procedure    Answer:   Jerson Furukawa A. Dossie Arbour, MD    Order Specific Question:   Indication/Reason    Answer:   Chronic low back pain and lower extremity pain secondary to epidural fibrosis associated with a failed back surgical syndrome    Follow-up plan:   Return for Procedure (Oral sedation): (R) RACZ Procedure #1.     Interventional Therapies  Risk  Complexity Considerations:   Estimated body mass index is 29.63 kg/m as calculated from the following:   Height as of this encounter: 5' 2"  (1.575 m).   Weight as of this encounter: 162 lb (73.5 kg). WNL   Planned  Pending:   Diagnostic right caudal ESI #1 + diagnostic epidurogram    Under consideration:   Possible candidate for right-sided Racz procedure  Diagnostic right lumbar facet block  Possible candidate for right thoracolumbar spinal cord stimulator trial/implant    Completed:   Diagnostic right SI joint block x1 (04/16/2021)    Completed by Dr. Sharlet Salina: Right L5-S1 TFESI x1 (12/07/2019)  Right S1 tTFESI x1 (12/07/2019)    Therapeutic  Palliative (PRN) options:   None established     Recent Visits Date Type Provider Dept  05/16/21 Procedure visit Milinda Pointer, MD [facility]  04/30/21 Office Visit Milinda Pointer, MD [facility]  04/16/21 Procedure visit Milinda Pointer, MD [facility]  04/09/21 Office Visit Milinda Pointer, MD [facility]  03/13/21  Office Visit Milinda Pointer, MD [facility]  Showing recent visits within past 90 days and meeting all other requirements Today's Visits Date Type Provider Dept  05/30/21 Office Visit Milinda Pointer, MD [facility]  Showing today's visits and meeting all other requirements Future Appointments Date Type Provider Dept  06/13/21 Appointment Milinda Pointer, MD [facility]  07/02/21 Appointment Milinda Pointer, MD [facility]  Showing future appointments within next 90 days and meeting all other requirements I discussed the assessment and treatment plan with the patient. The patient was provided an opportunity to ask questions and all were answered. The patient agreed with the plan and demonstrated an understanding of the instructions.  Patient advised to call back or seek an in-person evaluation if the symptoms or condition worsens.  Duration of encounter: 35 minutes.  Note by: Gaspar Cola, MD  Date: 05/30/2021; Time: 4:45 PM

## 2021-05-29 ENCOUNTER — Other Ambulatory Visit: Payer: Self-pay

## 2021-05-29 MED FILL — Trazodone HCl Tab 100 MG: ORAL | 90 days supply | Qty: 90 | Fill #1 | Status: AC

## 2021-05-29 MED FILL — Lamotrigine Tab 200 MG: ORAL | 30 days supply | Qty: 30 | Fill #3 | Status: AC

## 2021-05-30 ENCOUNTER — Other Ambulatory Visit: Payer: Self-pay

## 2021-05-30 ENCOUNTER — Ambulatory Visit: Payer: 59 | Attending: Pain Medicine | Admitting: Pain Medicine

## 2021-05-30 ENCOUNTER — Telehealth: Payer: Self-pay

## 2021-05-30 ENCOUNTER — Encounter: Payer: Self-pay | Admitting: Pain Medicine

## 2021-05-30 VITALS — BP 137/99 | HR 94 | Temp 96.2°F | Resp 16 | Ht 62.0 in | Wt 164.0 lb

## 2021-05-30 DIAGNOSIS — M79604 Pain in right leg: Secondary | ICD-10-CM | POA: Diagnosis not present

## 2021-05-30 DIAGNOSIS — M4807 Spinal stenosis, lumbosacral region: Secondary | ICD-10-CM | POA: Insufficient documentation

## 2021-05-30 DIAGNOSIS — Z9889 Other specified postprocedural states: Secondary | ICD-10-CM | POA: Diagnosis not present

## 2021-05-30 DIAGNOSIS — R937 Abnormal findings on diagnostic imaging of other parts of musculoskeletal system: Secondary | ICD-10-CM | POA: Diagnosis not present

## 2021-05-30 DIAGNOSIS — M5137 Other intervertebral disc degeneration, lumbosacral region: Secondary | ICD-10-CM | POA: Insufficient documentation

## 2021-05-30 DIAGNOSIS — M961 Postlaminectomy syndrome, not elsewhere classified: Secondary | ICD-10-CM | POA: Insufficient documentation

## 2021-05-30 DIAGNOSIS — G96198 Other disorders of meninges, not elsewhere classified: Secondary | ICD-10-CM | POA: Diagnosis not present

## 2021-05-30 DIAGNOSIS — G8929 Other chronic pain: Secondary | ICD-10-CM | POA: Diagnosis not present

## 2021-05-30 DIAGNOSIS — M545 Low back pain, unspecified: Secondary | ICD-10-CM | POA: Insufficient documentation

## 2021-05-30 MED ORDER — DIAZEPAM 10 MG PO TABS
10.0000 mg | ORAL_TABLET | ORAL | 0 refills | Status: DC
  Filled 2021-05-30 (×4): qty 1, 1d supply, fill #0

## 2021-05-30 NOTE — Telephone Encounter (Signed)
Pt is scheduled for a RACZ procedure on 06/13/21 at 8am

## 2021-05-30 NOTE — Patient Instructions (Signed)
______________________________________________________________________  Preparing for Procedure with Oral Anxiolytics  Definition: Anxiolytics - Medications that provide muscle relaxation and decrease anxiety.  Procedure appointments are limited to planned procedures: No Prescription Refills. No disability issues will be discussed. No medication changes will be discussed.  Instructions: Oral Intake: Do not eat or drink anything for at least 6 hours prior to your procedure. (Exception: Blood Pressure Medication. See below.)  Anxiolytic Medication: This medication is meant to relax you during your procedure. DO NOT DRIVE OR OPERATE DANGEROUS MACHINERY WHILE UNDER THE INFLUENCE OF THIS MEDICATION. Take the oral medication (i.e.: Valium) as prescribed on the day of your procedure with just a sip of water. Prescription will be sent to your pharmacy, prior to the day of your procedure. Topical anesthetic: Your physician might have recommended a topical anesthetic/analgesic to apply over the area where the procedure will be done. If so, apply to area 1 hour prior to procedure. For exact location of application, ask your healthcare provider.    Transportation: A driver is required. You may not drive yourself after the procedure. Blood Pressure Medicine: Do not forget to take your blood pressure medicine with a sip of water the morning of the procedure. If your Diastolic (lower reading) is above 100 mmHg, elective cases will be cancelled/rescheduled. Blood thinners: These will need to be stopped for procedures. Notify our staff if you are taking any blood thinners. Depending on which one you take, there will be specific instructions on how and when to stop it. Diabetics on insulin: Notify the staff so that you can be scheduled 1st case in the morning. If your diabetes requires high dose insulin, take only  of your normal insulin dose the morning of the procedure and notify the staff that you have done  so. Preventing infections: Shower with an antibacterial soap the morning of your procedure. Build-up your immune system: Take 1000 mg of Vitamin C with every meal (3 times a day) the day prior to your procedure. Antibiotics: Inform the staff if you have a condition or reason that requires you to take antibiotics before dental procedures. Pregnancy: If you are pregnant, call and cancel the procedure. Sickness: If you have a cold, fever, or any active infections, call and cancel the procedure. Arrival: You must be in the facility at least 30 minutes prior to your scheduled procedure. Children: Do not bring children with you. Dress appropriately: Bring dark clothing that you would not mind if they get stained. Valuables: Do not bring any jewelry or valuables.  Reasons to call and reschedule or cancel your procedure:  NOTE: Following these recommendations will minimize the risk of a serious complication. Surgeries: Avoid having procedures within 2 weeks of any surgery. (Avoid for 2 weeks before or after any surgery). Flu Shots: Avoid having procedures within 2 weeks of a flu shots. (Avoid for 2 weeks before or after immunizations). Barium: Avoid having a procedure within 7-10 days after having had a radiological study involving the use of radiological contrast. (Myelograms, Barium swallow or enema study). Heart attacks: Avoid any elective procedures or surgeries for the initial 6 months after a "Myocardial Infarction" (Heart Attack). Blood thinners: It is imperative that you stop these medications before procedures. Let us know if you if you take any blood thinner.  Infection: Avoid procedures during or within two weeks of an infection (including chest colds or gastrointestinal problems). Symptoms associated with infections include: Localized redness, fever, chills, night sweats or profuse sweating, burning sensation when voiding, cough, congestion,  stuffiness, runny nose, sore throat, diarrhea,  nausea, vomiting, cold or Flu symptoms, recent or current infections. It is especially important if the infection is over the area that we intend to treat. Heart and lung problems: Symptoms that may suggest an active cardiopulmonary problem include: cough, chest pain, breathing difficulties or shortness of breath, dizziness, ankle swelling, uncontrolled high or unusually low blood pressure, and/or palpitations. If you are experiencing any of these symptoms, cancel your procedure and contact your primary care physician for an evaluation.  Remember:  Regular Business hours are:  Monday to Thursday 8:00 AM to 4:00 PM  Provider's Schedule: Milinda Pointer, MD:  Procedure days: Tuesday and Thursday 7:30 AM to 4:00 PM  Gillis Santa, MD:  Procedure days: Monday and Wednesday 7:30 AM to 4:00 PM ______________________________________________________________________  ____________________________________________________________________________________________  General Risks and Possible Complications  Patient Responsibilities: It is important that you read this as it is part of your informed consent. It is our duty to inform you of the risks and possible complications associated with treatments offered to you. It is your responsibility as a patient to read this and to ask questions about anything that is not clear or that you believe was not covered in this document.  Patient's Rights: You have the right to refuse treatment. You also have the right to change your mind, even after initially having agreed to have the treatment done. However, under this last option, if you wait until the last second to change your mind, you may be charged for the materials used up to that point.  Introduction: Medicine is not an Chief Strategy Officer. Everything in Medicine, including the lack of treatment(s), carries the potential for danger, harm, or loss (which is by definition: Risk). In Medicine, a complication is a secondary  problem, condition, or disease that can aggravate an already existing one. All treatments carry the risk of possible complications. The fact that a side effects or complications occurs, does not imply that the treatment was conducted incorrectly. It must be clearly understood that these can happen even when everything is done following the highest safety standards.  No treatment: You can choose not to proceed with the proposed treatment alternative. The "PRO(s)" would include: avoiding the risk of complications associated with the therapy. The "CON(s)" would include: not getting any of the treatment benefits. These benefits fall under one of three categories: diagnostic; therapeutic; and/or palliative. Diagnostic benefits include: getting information which can ultimately lead to improvement of the disease or symptom(s). Therapeutic benefits are those associated with the successful treatment of the disease. Finally, palliative benefits are those related to the decrease of the primary symptoms, without necessarily curing the condition (example: decreasing the pain from a flare-up of a chronic condition, such as incurable terminal cancer).  General Risks and Complications: These are associated to most interventional treatments. They can occur alone, or in combination. They fall under one of the following six (6) categories: no benefit or worsening of symptoms; bleeding; infection; nerve damage; allergic reactions; and/or death. No benefits or worsening of symptoms: In Medicine there are no guarantees, only probabilities. No healthcare provider can ever guarantee that a medical treatment will work, they can only state the probability that it may. Furthermore, there is always the possibility that the condition may worsen, either directly, or indirectly, as a consequence of the treatment. Bleeding: This is more common if the patient is taking a blood thinner, either prescription or over the counter (example: Goody  Powders, Fish oil, Aspirin, Garlic, etc.),  or if suffering a condition associated with impaired coagulation (example: Hemophilia, cirrhosis of the liver, low platelet counts, etc.). However, even if you do not have one on these, it can still happen. If you have any of these conditions, or take one of these drugs, make sure to notify your treating physician. Infection: This is more common in patients with a compromised immune system, either due to disease (example: diabetes, cancer, human immunodeficiency virus [HIV], etc.), or due to medications or treatments (example: therapies used to treat cancer and rheumatological diseases). However, even if you do not have one on these, it can still happen. If you have any of these conditions, or take one of these drugs, make sure to notify your treating physician. Nerve Damage: This is more common when the treatment is an invasive one, but it can also happen with the use of medications, such as those used in the treatment of cancer. The damage can occur to small secondary nerves, or to large primary ones, such as those in the spinal cord and brain. This damage may be temporary or permanent and it may lead to impairments that can range from temporary numbness to permanent paralysis and/or brain death. Allergic Reactions: Any time a substance or material comes in contact with our body, there is the possibility of an allergic reaction. These can range from a mild skin rash (contact dermatitis) to a severe systemic reaction (anaphylactic reaction), which can result in death. Death: In general, any medical intervention can result in death, most of the time due to an unforeseen complication. ____________________________________________________________________________________________

## 2021-06-10 NOTE — Progress Notes (Signed)
PROVIDER NOTE: Information contained herein reflects review and annotations entered in association with encounter. Interpretation of such information and data should be left to medically-trained personnel. Information provided to patient can be located elsewhere in the medical record under "Patient Instructions". Document created using STT-dictation technology, any transcriptional errors that may result from process are unintentional.    Patient: Tammy Deleon  Service Category: Procedure  Provider: Gaspar Cola, MD  DOB: [birth year]  DOS: 06/13/2021  Location: [facility]  MRN: [id number]  Setting: Ambulatory - outpatient  Referring Provider: Adin Hector, MD  Type: Established Patient  Specialty: Interventional Pain Management  PCP: Adin Hector, MD   Primary Reason for Visit: Interventional Pain Management Treatment. CC: Back Pain (Low/buttocks)  Procedure:          Anesthesia, Analgesia, Anxiolysis:  Type: Therapeutic Percutaneous Epidural Neuroplasty and Lysis of Adhesions (RACZ Procedure)           Region: Caudal Level: Sacrococcygeal   Laterality: Midline         Type: Minimal (Conscious) Anxiolysis combined with Local Anesthesia Indication(s): Analgesia and Anxiety Route: Intravenous (IV) IV Access: Secured Sedation: Meaningful verbal contact was maintained at all times during the procedure  Local Anesthetic: Lidocaine 1-2%  Position: Prone   Indications: 1. Failed back surgical syndrome   2. Chronic low back pain (1ry area of Pain) (Right) w/o sciatica   3. Chronic lower extremity pain (intermittent) (2ry area of Pain) (Right)   4. DDD (degenerative disc disease), lumbosacral   5. Epidural fibrosis (S1) (Right)   6. History of lumbar laminectomy x2   7. Lumbosacral lateral recess stenosis (L5-S1) (Bilateral)    Pain Score: Pre-procedure: 7 /10 Post-procedure: 4 /10   RTCB: 10/07/2021  Pre-op H&P Assessment:  Tammy Deleon is a 35 y.o.  (year old), female patient, seen today for interventional treatment. She  has a past surgical history that includes Gallbladder surgery; Hemi-microdiscectomy lumbar laminectomy level 1 (Right, 01/02/2020); and Hemi-microdiscectomy lumbar laminectomy level 1 (Right, 11/05/2020). Tammy Deleon has a current medication list which includes the following prescription(s): amphetamine-dextroamphetamine, diazepam, hydrocodone-acetaminophen, ibuprofen, lamotrigine, lamotrigine, levonorgestrel-ethinyl estradiol, trazodone, trazodone, valacyclovir, [START ON 07/09/2021] hydrocodone-acetaminophen, [START ON 08/08/2021] hydrocodone-acetaminophen, [START ON 09/07/2021] hydrocodone-acetaminophen, levonorgestrel-ethinyl estradiol, and phentermine, and the following Facility-Administered Medications: cefazolin, lidocaine, midazolam, ropivacaine (pf) 2 mg/ml (0.2%), sodium chloride hypertonic 10% epidural injection, and triamcinolone acetonide. Her primarily concern today is the Back Pain (Low/buttocks)  Initial Vital Signs:  Pulse/HCG Rate: 88ECG Heart Rate: 77 Temp: (!) 97 F (36.1 C) Resp: 16 BP: 108/82 SpO2: 100 %  BMI: Estimated body mass index is 30 kg/m as calculated from the following:   Height as of this encounter: 5' 2"  (1.575 m).   Weight as of this encounter: 164 lb (74.4 kg).  Risk Assessment: Allergies: Reviewed. She is allergic to sulfa antibiotics.  Allergy Precautions: None required Coagulopathies: Reviewed. None identified.  Blood-thinner therapy: None at this time Active Infection(s): Reviewed. None identified. Tammy Deleon is afebrile  Site Confirmation: Tammy Deleon was asked to confirm the procedure and laterality before marking the site Procedure checklist: Completed Consent: Before the procedure and under the influence of no sedative(s), amnesic(s), or anxiolytics, the patient was informed of the treatment options, risks and possible complications. To fulfill our ethical and legal obligations,  as recommended by the American Medical Association's Code of Ethics, I have informed the patient of my clinical impression; the nature and purpose of the treatment or procedure; the risks,  benefits, and possible complications of the intervention; the alternatives, including doing nothing; the risk(s) and benefit(s) of the alternative treatment(s) or procedure(s); and the risk(s) and benefit(s) of doing nothing. The patient was provided information about the general risks and possible complications associated with the procedure. These may include, but are not limited to: failure to achieve desired goals, infection, bleeding, organ or nerve damage, allergic reactions, paralysis, and death. In addition, the patient was informed of those risks and complications associated to Spine-related procedures, such as failure to decrease pain; infection (i.e.: Meningitis, epidural or intraspinal abscess); bleeding (i.e.: epidural hematoma, subarachnoid hemorrhage, or any other type of intraspinal or peri-dural bleeding); organ or nerve damage (i.e.: Any type of peripheral nerve, nerve root, or spinal cord injury) with subsequent damage to sensory, motor, and/or autonomic systems, resulting in permanent pain, numbness, and/or weakness of one or several areas of the body; allergic reactions; (i.e.: anaphylactic reaction); and/or death. Furthermore, the patient was informed of those risks and complications associated with the medications. These include, but are not limited to: allergic reactions (i.e.: anaphylactic or anaphylactoid reaction(s)); adrenal axis suppression; blood sugar elevation that in diabetics may result in ketoacidosis or comma; water retention that in patients with history of congestive heart failure may result in shortness of breath, pulmonary edema, and decompensation with resultant heart failure; weight gain; swelling or edema; medication-induced neural toxicity; particulate matter embolism and blood vessel  occlusion with resultant organ, and/or nervous system infarction; and/or aseptic necrosis of one or more joints. Finally, the patient was informed that Medicine is not an exact science; therefore, there is also the possibility of unforeseen or unpredictable risks and/or possible complications that may result in a catastrophic outcome. The patient indicated having understood very clearly. We have given the patient no guarantees and we have made no promises. Enough time was given to the patient to ask questions, all of which were answered to the patient's satisfaction. Ms. Guyett has indicated that she wanted to continue with the procedure. Attestation: I, the ordering provider, attest that I have discussed with the patient the benefits, risks, side-effects, alternatives, likelihood of achieving goals, and potential problems during recovery for the procedure that I have provided informed consent. Date  Time: 06/13/2021  8:05 AM  Pre-Procedure Preparation:  Monitoring: As per clinic protocol. Respiration, ETCO2, SpO2, BP, heart rate and rhythm monitor placed and checked for adequate function Safety Precautions: Patient was assessed for positional comfort and pressure points before starting the procedure. Time-out: I initiated and conducted the "Time-out" before starting the procedure, as per protocol. The patient was asked to participate by confirming the accuracy of the "Time Out" information. Verification of the correct person, site, and procedure were performed and confirmed by me, the nursing staff, and the patient. "Time-out" conducted as per Joint Commission's Universal Protocol (UP.01.01.01). Time: 0855  Description of Procedure:          Target Area: Caudal Epidural Canal Approach: Midline approach Area Prepped: Entire Posterior Sacrococcygeal Region DuraPrep (Iodine Povacrylex [0.7% available iodine] and Isopropyl Alcohol, 74% w/w) Safety Precautions: Aspiration looking for blood return was  conducted prior to all injections. At no point did I inject any substances, as a needle was being advanced. No attempts were made at seeking any paresthesias. Safe injection practices and needle disposal techniques used. Medications properly checked for expiration dates. SDV (single dose vial) medications used. Description of the Procedure: Protocol guidelines were followed. The patient was placed in position over the fluoroscopy table. Patient assessed for  comfort and pressure points before starting procedure. The target area was identified and the area prepped in the usual manner. Skin & deeper tissues infiltrated with local anesthetic. Appropriate amount of time allowed to pass for local anesthetics to take effect. The epidural needle was then advanced to the target area, via the sacral hiatus, between the sacral cornu. Proper needle placement secured. Negative aspiration confirmed. Step 1: Epidurogram performed by slowly injecting a non-ionic, water-soluble, hypoallergenic, myelogram-compatible radiological contrast. Defect identified and Racz catheter advanced slowly next to rest proximal to it without attempting to perforate or puncture the defect. At this point, the epidural needle was removed. Step 2: Contrast was again injected, this time thru the catheter, under live fluoroscopy, closely observing for vascular uptake or intrathecal spread. Step 3: Once no vascular uptake or intrathecal spread was confirmed, a 2 mL test-dose using 2% PF-Lidocaine with 1:200,000 Epinephrine was injected thru the catheter, while closely monitoring for an increase in heart rate or evidence of spinal anesthesia. Step 4: After waiting over 5 minutes, the patient was assessed for evidence of a spinal block. Step 5: Once I had confirmed that there was no vascular uptake or evidence of intrathecal spread, I then slowly injected 1,500 Units of hyaluronidase, carefully monitoring for allergic reactions. I then waited 10  minutes, using this time to secure the catheter using a sterile benzoin tincture swab and (11m x 100 mm) steri-strip. After confirming vitals to be stable, the patient was transferred to the recovery area where Ms. PBarbawas kept under constant observation and monitored as per post-sedation protocol. Step 6: After the 10 minutes, I proceeded to slowly inject a solution containing 0.2% MPF-Ropivacaine (4 mL) + 0.9% PF-NSS (5 mL) + SDV Triamcinolone 40 mg/mL (1 mL), in intermittent fashion, asking for systemic symptoms every 0.5cc of injectate. Step 7:  30 minutes later, a neurological exam was conducted for any evidence of a spinal blockade. Step 8:  After confirming the absence of anesthesia, I slowly injected the 10% PF-Hypertonic Saline, stopping to assess, any time the patient described any discomfort. Once the procedure was completed, I removed the catheter, taking time to show the patient its spring tip, and demonstrating to the patient that none had been left behind. EBL: None Materials & Medications used:  1. Racz Tun-L-Kath (Epimed, GAvera [place] Catheter. (or similar)(Perifix 20Gx100cm) 2. 2" Foam Tape 3. Benzoin tincture swab 4. Steri-Strip (14mx 100 mm) 5. Non-occlusive Transparent Dressing (3MT TegadermT) 6. Bacteriostatic Filter for Epidural Catheter 7. Epidural Kit for 20G catheter Needle(s) used: 20g - 10cm, Tuohy-style epidural needle   Medications used:  1. Skin infiltration: 2.0% Lidocaine (1073m2. Test-dose: 1.5 % Lidocaine w/ Epi 1:200,000 (5ml20m. Epidural Steroid injection:  A). Steroid: Triamcinolone 40 mg/mL (SDV) (1ml)73m. Local Anesthetic: 0.2% PF-Ropivacaine (2 mg/mL) (4 mL) C). Dilution agent: 0.9% PF-NSS (injectable saline) (5 mL) 6. Neurolytic Agent: 10% PF-Sodium Chloride (Hypertonic Saline) (10ml)52m.4% PF-Sodium Chloride (4.273mL) 49m-Sterile H2O (5.727mL) =2m PF-Sodium Chloride (10mL)] 759mar softening agent: Hyaluronidase (150 units/mL) x (10  mL) = 1500 Units 8. Radiological Contrast Media: Isovue-M 200 (10 mL)  Vitals:   06/13/21 1018 06/13/21 1028 06/13/21 1038 06/13/21 1049  BP: 125/88 120/82 117/89 132/89  Pulse:      Resp: 10 12 14 17   Temp:      SpO2: 100% 100% 100% 100%  Weight:      Height:        Start Time: 0855 hrs. End Time: [id number]  hrs. Epidurogram:  Epidurogram flow pattern demonstrated a restricted low at the level of the surgery. The epidural catheter was placed next to this restriction without attempting to perforate it.  Initial injection of contrast demonstrated this solution to be preferentially moving towards the left side of the caudal canal and lower epidural space with no demarcation of the S1 or L5 nerve roots.  The catheter was then introduced and ran through the right side of the caudal canal into the area of the S1 nerve root.  Contrast was again injected in this area and it perfectly delineated the S1 nerve root but nothing for the L5.  Several attempts were made at repositioning the contrast for the purpose of getting to the L5 but none of them were successful and ultimately the catheter was left at a location where we could treat the right S1 nerve root.  The patient was informed of this and she was also informed the fact that we may not be able to get the L5 through this particular approach and we may have to later attempt to get into a transforaminal approach. Materials:  Needle(s) Type: Epidural needle Gauge: 20G Length: 3.5-in Medication(s): Please see orders for medications and dosing details.  Imaging Guidance (Spinal):          Type of Imaging Technique: Fluoroscopy Guidance (Spinal) Indication(s): Assistance in needle guidance and placement for procedures requiring needle placement in or near specific anatomical locations not easily accessible without such assistance. Exposure Time: Please see nurses notes. Contrast: Before injecting any contrast, we confirmed that the patient did not have an  allergy to iodine, shellfish, or radiological contrast. Once satisfactory needle placement was completed at the desired level, radiological contrast was injected. Contrast injected under live fluoroscopy. No contrast complications. See chart for type and volume of contrast used. Fluoroscopic Guidance: I was personally present during the use of fluoroscopy. "Tunnel Vision Technique" used to obtain the best possible view of the target area. Parallax error corrected before commencing the procedure. "Direction-depth-direction" technique used to introduce the needle under continuous pulsed fluoroscopy. Once target was reached, antero-posterior, oblique, and lateral fluoroscopic projection used confirm needle placement in all planes. Images permanently stored in EMR. Interpretation: I personally interpreted the imaging intraoperatively. Adequate needle placement confirmed in multiple planes. Appropriate spread of contrast into desired area was observed. No evidence of afferent or efferent intravascular uptake. No intrathecal or subarachnoid spread observed. Permanent images saved into the patient's record.  Antibiotic Prophylaxis:   Anti-infectives (From admission, onward)    Start     Dose/Rate Route Frequency Ordered Stop   06/13/21 0857  ceFAZolin (ANCEF) IVPB 1 g/50 mL premix        1 g 100 mL/hr over 30 Minutes Intravenous 30 min pre-op 06/13/21 0857     06/13/21 0830  ceFAZolin (ANCEF) powder 1 g  Status:  Discontinued        1 g Other 30 min pre-op 06/13/21 0818 06/13/21 0820      Indication(s): None identified  Post-operative Assessment:  Post-procedure Vital Signs:  Pulse/HCG Rate: 8894 Temp: (!) 97 F (36.1 C) Resp: 17 BP: 132/89 SpO2: 100 %  EBL: None  Complications: No immediate post-treatment complications observed by team, or reported by patient.  Note: The patient tolerated the entire procedure well. A repeat set of vitals were taken after the procedure and the patient was  kept under observation following institutional policy, for this type of procedure. Post-procedural neurological assessment was performed, showing return to  baseline, prior to discharge. The patient was provided with post-procedure discharge instructions, including a section on how to identify potential problems. Should any problems arise concerning this procedure, the patient was given instructions to immediately contact us, at any time, without hesitation. In any case, we plan to contact the patient by telephone for a follow-up status report regarding this interventional procedure.  Comments:  No additional relevant information.  Plan of Care  Orders:  Orders Placed This Encounter  Procedures   Racz (One Day)    Equipment: Epidural Tray; Racz Catheter; 10% Hypertonic Saline; Hyaluronidase; 3/4" Steri-strips; 4x4 Sterile Gauze pack.    Scheduling Instructions:     Procedure: RACZ Epidural Lysis of Adhesions     Side: Midline     Sedation: Moderate Conscious Sedation     Timeframe: Today   DG PAIN CLIN[facility] C-ARM 1-60 MIN NO REPORT    Intraoperative interpretation by procedural physician at [place].    Standing Status:   Standing    Number of Occurrences:   1    Order Specific Question:   Reason for exam:    Answer:   Assistance in needle guidance and placement for procedures requiring needle placement in or near specific anatomical locations not easily accessible without such assistance.   Informed Consent Details: Physician/Practitioner Attestation; Transcribe to consent form and obtain patient signature    Nursing Order: Transcribe to consent form and obtain patient signature. Note: Always confirm laterality of pain with Ms. Hardin Negus, before procedure.    Order Specific Question:   Physician/Practitioner attestation of informed consent for procedure/surgical case    Answer:   I, the physician/practitioner, attest that I have discussed with the patient the benefits, risks, side  effects, alternatives, likelihood of achieving goals and potential problems during recovery for the procedure that I have provided informed consent.    Order Specific Question:   Procedure    Answer:   RACZ Procedure (Epidural Lysis of Adhesions)    Order Specific Question:   Physician/Practitioner performing the procedure    Answer:   Nathon Stefanski A. Dossie Arbour, MD    Order Specific Question:   Indication/Reason    Answer:   Chronic low back pain and lower extremity pain secondary to epidural fibrosis associated with a failed back surgical syndrome   Provide equipment / supplies at bedside    "Epidural Tray" (Disposable  single use) Catheter: NOT required    Standing Status:   Standing    Number of Occurrences:   1    Order Specific Question:   Specify    Answer:   Epidural Tray    Chronic Opioid Analgesic:  Hydrocodone/APAP 5/325, 1 tab p.o. 4 times daily (#56) (filled 02/27/2021) MME/day: 20 mg/day   Medications ordered for procedure: Meds ordered this encounter  Medications   iohexol (OMNIPAQUE) 180 MG/ML injection 10 mL    Must be Myelogram-compatible. If not available, you may substitute with a water-soluble, non-ionic, hypoallergenic, myelogram-compatible radiological contrast medium.   lidocaine (XYLOCAINE) 2 % (with pres) injection 400 mg   lactated ringers infusion 1,000 mL   midazolam (VERSED) 5 MG/5ML injection 1-2 mg    Make sure Flumazenil is available in the pyxis when using this medication. If oversedation occurs, administer 0.2 mg IV over 15 sec. If after 45 sec no response, administer 0.2 mg again over 1 min; may repeat at 1 min intervals; not to exceed 4 doses (1 mg)   DISCONTD: ceFAZolin (ANCEF) powder 1 g   lidocaine-EPINEPHrine (XYLOCAINE  W/EPI) 2 %-1:200000 (PF) injection 10 mL    If 2% is unavailable, may be substituted with 1.5%, but must be preservative-free. If 1:200,000 concentration of epinephrine is not available, it may be substituted with 1:100,000. Notify  physician of changes, before procedure.   ropivacaine (PF) 2 mg/mL (0.2%) (NAROPIN) injection 4 mL   triamcinolone acetonide (KENALOG-40) injection 40 mg   hyaluronidase Human (HYLENEX) injection 1,500 Units    10 mL of the 150 Units/mL   sodium chloride hypertonic 10% epidural injection    For Racz Epidural Lysis of Adhesions.   sodium chloride flush (NS) 0.9 % injection 4 mL   HYDROcodone-acetaminophen (NORCO/VICODIN) 5-325 MG tablet    Sig: Take 1 tablet by mouth every 6 (six) hours as needed for severe pain. Must last 30 days.    Dispense:  100 tablet    Refill:  0    Not a duplicate. Do NOT delete! Dispense 1 day early if closed on fill date. Warn not to take CNS-depressants 8 hours before or after taking opioid. Do not send refill request. Renewal requires appointment.   HYDROcodone-acetaminophen (NORCO/VICODIN) 5-325 MG tablet    Sig: Take 1 tablet by mouth every 6 (six) hours as needed for severe pain. Must last 30 days.    Dispense:  100 tablet    Refill:  0    Not a duplicate. Do NOT delete! Dispense 1 day early if closed on fill date. Warn not to take CNS-depressants 8 hours before or after taking opioid. Do not send refill request. Renewal requires appointment.   HYDROcodone-acetaminophen (NORCO/VICODIN) 5-325 MG tablet    Sig: Take 1 tablet by mouth every 6 (six) hours as needed for severe pain. Must last 30 days.    Dispense:  100 tablet    Refill:  0    Not a duplicate. Do NOT delete! Dispense 1 day early if closed on fill date. Warn not to take CNS-depressants 8 hours before or after taking opioid. Do not send refill request. Renewal requires appointment.   sodium chloride (PF) 0.9 % injection    Shatley, Jennifer   : cabinet override   lidocaine HCl (PF) (XYLOCAINE) 2 % injection    Shatley, Jennifer   : cabinet override   ropivacaine (PF) 2 mg/mL (0.2%) (NAROPIN) 2 MG/ML injection    Shatley, Jennifer   : cabinet override   triamcinolone acetonide (KENALOG-40) 40 MG/ML  injection    Shatley, Jennifer   : cabinet override   fentaNYL (SUBLIMAZE) 100 MCG/2ML injection    Shatley, Jennifer   : cabinet override   midazolam (VERSED) 5 MG/5ML injection    Shatley, Jennifer   : cabinet override   ceFAZolin (ANCEF) IVPB 1 g/50 mL premix    Order Specific Question:   Antibiotic Indication:    Answer:   Surgical Prophylaxis    Medications administered: We administered iohexol, lactated ringers, lidocaine-EPINEPHrine, ropivacaine (PF) 2 mg/mL (0.2%), hyaluronidase Human, sodium chloride flush, sodium chloride (PF), lidocaine HCl (PF), triamcinolone acetonide, fentaNYL, and midazolam.  See the medical record for exact dosing, route, and time of administration.  Follow-up plan:   Return in about 2 weeks (around 06/27/2021) for (T,Th) (VV) (PPE).      Interventional Therapies  Risk  Complexity Considerations:   Estimated body mass index is 29.63 kg/m as calculated from the following:   Height as of this encounter: 5' 2"  (1.575 m).   Weight as of this encounter: 162 lb (73.5 kg). WNL   Planned  Pending:   Diagnostic right caudal ESI #1 + diagnostic epidurogram    Under consideration:   Possible candidate for right-sided Racz procedure  Diagnostic right lumbar facet block  Possible candidate for right thoracolumbar spinal cord stimulator trial/implant    Completed:   Diagnostic right SI joint block x1 (04/16/2021)    Completed by Dr. Sharlet Salina: Right L5-S1 TFESI x1 (12/07/2019)  Right S1 tTFESI x1 (12/07/2019)    Therapeutic  Palliative (PRN) options:   None established      Recent Visits Date Type Provider Dept  05/30/21 Office Visit Milinda Pointer, MD [facility]  05/16/21 Procedure visit Milinda Pointer, MD [facility]  04/30/21 Office Visit Milinda Pointer, MD [facility]  04/16/21 Procedure visit Milinda Pointer, MD [facility]  04/09/21 Office Visit Milinda Pointer, MD [facility]  Showing recent visits within past 90 days and meeting all other requirements Today's Visits Date Type Provider Dept  06/13/21 Procedure visit Milinda Pointer, MD [facility]  Showing today's visits and meeting all other requirements Future Appointments Date Type Provider Dept  07/18/21 Appointment Milinda Pointer, MD [facility]  Showing future appointments within next 90 days and meeting all other requirements Disposition: Discharge home  Discharge (Date  Time): 06/13/2021; 1050 hrs.   Primary Care Physician: Adin Hector, MD Location: [facility] Note by: Gaspar Cola, MD Date: 06/13/2021; Time: 12:29 PM  Disclaimer:  Medicine is not an Chief Strategy Officer. The only guarantee in medicine is that nothing is guaranteed. It is important to note that the decision to proceed with this intervention was based on the information collected from the patient. The Data and conclusions were drawn from the patient's questionnaire, the interview, and the physical examination. Because the information was provided in large part by the patient, it cannot be guaranteed that it has not been purposely or unconsciously manipulated. Every effort has been made to obtain as much relevant data as possible for this evaluation. It is important to note that the conclusions that lead to this procedure are derived in large part from the available data. Always take into account that the treatment will also be dependent on availability of resources and existing treatment guidelines, considered by other Pain Management Practitioners as being common knowledge and practice, at the time of the intervention. For Medico-Legal purposes, it is also important to point out that variation in procedural techniques and pharmacological choices are the acceptable norm. The indications, contraindications, technique, and results of the above procedure should only be interpreted  and judged by a Board-Certified Interventional Pain Specialist with extensive familiarity and expertise in the same exact procedure and technique.

## 2021-06-12 ENCOUNTER — Other Ambulatory Visit: Payer: Self-pay

## 2021-06-12 MED FILL — Ibuprofen Tab 800 MG: ORAL | 10 days supply | Qty: 30 | Fill #7 | Status: AC

## 2021-06-12 MED FILL — Valacyclovir HCl Tab 500 MG: ORAL | 6 days supply | Qty: 12 | Fill #0 | Status: AC

## 2021-06-13 ENCOUNTER — Other Ambulatory Visit: Payer: Self-pay

## 2021-06-13 ENCOUNTER — Ambulatory Visit (HOSPITAL_BASED_OUTPATIENT_CLINIC_OR_DEPARTMENT_OTHER): Payer: 59 | Admitting: Pain Medicine

## 2021-06-13 ENCOUNTER — Ambulatory Visit
Admission: RE | Admit: 2021-06-13 | Discharge: 2021-06-13 | Disposition: A | Discharge status: Home / Self Care | Payer: 59 | Source: Ambulatory Visit | Attending: Pain Medicine | Admitting: Pain Medicine

## 2021-06-13 VITALS — BP 132/89 | HR 88 | Temp 97.0°F | Resp 17 | Ht 62.0 in | Wt 164.0 lb

## 2021-06-13 DIAGNOSIS — M4807 Spinal stenosis, lumbosacral region: Secondary | ICD-10-CM | POA: Diagnosis not present

## 2021-06-13 DIAGNOSIS — M961 Postlaminectomy syndrome, not elsewhere classified: Secondary | ICD-10-CM | POA: Insufficient documentation

## 2021-06-13 DIAGNOSIS — G96198 Other disorders of meninges, not elsewhere classified: Secondary | ICD-10-CM | POA: Insufficient documentation

## 2021-06-13 DIAGNOSIS — Z79891 Long term (current) use of opiate analgesic: Secondary | ICD-10-CM | POA: Diagnosis present

## 2021-06-13 DIAGNOSIS — M545 Low back pain, unspecified: Secondary | ICD-10-CM | POA: Insufficient documentation

## 2021-06-13 DIAGNOSIS — Z9889 Other specified postprocedural states: Secondary | ICD-10-CM | POA: Insufficient documentation

## 2021-06-13 DIAGNOSIS — M79604 Pain in right leg: Secondary | ICD-10-CM | POA: Insufficient documentation

## 2021-06-13 DIAGNOSIS — G8929 Other chronic pain: Secondary | ICD-10-CM | POA: Diagnosis not present

## 2021-06-13 DIAGNOSIS — M5137 Other intervertebral disc degeneration, lumbosacral region: Secondary | ICD-10-CM

## 2021-06-13 DIAGNOSIS — G894 Chronic pain syndrome: Secondary | ICD-10-CM | POA: Diagnosis not present

## 2021-06-13 DIAGNOSIS — Z79899 Other long term (current) drug therapy: Secondary | ICD-10-CM | POA: Diagnosis present

## 2021-06-13 DIAGNOSIS — M51379 Other intervertebral disc degeneration, lumbosacral region without mention of lumbar back pain or lower extremity pain: Secondary | ICD-10-CM

## 2021-06-13 MED ORDER — SODIUM CHLORIDE 0.9% FLUSH
4.0000 mL | Freq: Once | INTRAVENOUS | Status: AC
  Administered 2021-06-13: 4 mL

## 2021-06-13 MED ORDER — CEFAZOLIN SODIUM 1 G IJ SOLR
INTRAMUSCULAR | Status: AC
  Filled 2021-06-13: qty 10

## 2021-06-13 MED ORDER — TRIAMCINOLONE ACETONIDE 40 MG/ML IJ SUSP
INTRAMUSCULAR | Status: AC
  Administered 2021-06-13: 40 mg
  Filled 2021-06-13: qty 1

## 2021-06-13 MED ORDER — SODIUM CHLORIDE (PF) 0.9 % IJ SOLN
INTRAMUSCULAR | Status: AC
  Administered 2021-06-13: 10 mL
  Filled 2021-06-13: qty 10

## 2021-06-13 MED ORDER — HYDROCODONE-ACETAMINOPHEN 5-325 MG PO TABS
1.0000 | ORAL_TABLET | Freq: Four times a day (QID) | ORAL | 0 refills | Status: DC | PRN
  Filled 2021-08-09 – 2021-08-12 (×3): qty 100, 25d supply, fill #0

## 2021-06-13 MED ORDER — CEFAZOLIN SODIUM-DEXTROSE 1-4 GM/50ML-% IV SOLN: 1.0000 g | INTRAVENOUS | Status: DC

## 2021-06-13 MED ORDER — FENTANYL CITRATE (PF) 100 MCG/2ML IJ SOLN
INTRAMUSCULAR | Status: AC
  Administered 2021-06-13: 100 ug
  Filled 2021-06-13: qty 2

## 2021-06-13 MED ORDER — CEFAZOLIN (ANCEF) 1 G IV SOLR: 1.0000 g | INTRAVENOUS | Status: DC

## 2021-06-13 MED ORDER — MIDAZOLAM HCL 5 MG/5ML IJ SOLN
INTRAMUSCULAR | Status: AC
  Administered 2021-06-13: 4 mg
  Filled 2021-06-13: qty 5

## 2021-06-13 MED ORDER — LIDOCAINE HCL (PF) 2 % IJ SOLN
INTRAMUSCULAR | Status: AC
  Administered 2021-06-13: 5 mL
  Filled 2021-06-13: qty 5

## 2021-06-13 MED ORDER — ROPIVACAINE HCL 2 MG/ML IJ SOLN
4.0000 mL | Freq: Once | INTRAMUSCULAR | Status: AC
  Administered 2021-06-13: 4 mL via EPIDURAL

## 2021-06-13 MED ORDER — LIDOCAINE-EPINEPHRINE (PF) 2 %-1:200000 IJ SOLN
10.0000 mL | Freq: Once | INTRAMUSCULAR | Status: AC
  Administered 2021-06-13: 10 mL

## 2021-06-13 MED ORDER — ROPIVACAINE HCL 2 MG/ML IJ SOLN
INTRAMUSCULAR | Status: AC
  Filled 2021-06-13: qty 20

## 2021-06-13 MED ORDER — LIDOCAINE HCL 2 % IJ SOLN: 20.0000 mL | Freq: Once | INTRAMUSCULAR | Status: DC

## 2021-06-13 MED ORDER — HYDROCODONE-ACETAMINOPHEN 5-325 MG PO TABS
1.0000 | ORAL_TABLET | Freq: Four times a day (QID) | ORAL | 0 refills | Status: DC | PRN
  Filled 2021-07-11: qty 100, 30d supply, fill #0
  Filled 2021-07-12: qty 100, 25d supply, fill #0

## 2021-06-13 MED ORDER — STERILE WATER FOR INJECTION IJ SOLN
10.0000 mL | Freq: Once | INTRAVENOUS | Status: DC
  Filled 2021-06-13: qty 4.27

## 2021-06-13 MED ORDER — TRIAMCINOLONE ACETONIDE 40 MG/ML IJ SUSP: 40.0000 mg | Freq: Once | INTRAMUSCULAR | Status: DC

## 2021-06-13 MED ORDER — HYALURONIDASE HUMAN 150 UNIT/ML IJ SOLN
1500.0000 [IU] | Freq: Once | INTRAMUSCULAR | Status: AC
  Administered 2021-06-13: 1500 [IU] via INTRADERMAL
  Filled 2021-06-13: qty 10

## 2021-06-13 MED ORDER — IOHEXOL 180 MG/ML  SOLN
10.0000 mL | Freq: Once | INTRAMUSCULAR | Status: AC
  Administered 2021-06-13: 10 mL via EPIDURAL

## 2021-06-13 MED ORDER — MIDAZOLAM HCL 5 MG/5ML IJ SOLN: 1.0000 mg | INTRAMUSCULAR | Status: DC | PRN

## 2021-06-13 MED ORDER — LACTATED RINGERS IV SOLN
1000.0000 mL | Freq: Once | INTRAVENOUS | Status: AC
  Administered 2021-06-13: 1000 mL via INTRAVENOUS

## 2021-06-13 MED ORDER — HYDROCODONE-ACETAMINOPHEN 5-325 MG PO TABS
1.0000 | ORAL_TABLET | Freq: Four times a day (QID) | ORAL | 0 refills | Status: DC | PRN
  Filled 2021-09-10 – 2021-09-11 (×3): qty 100, 25d supply, fill #0

## 2021-06-13 NOTE — Patient Instructions (Signed)

## 2021-06-13 NOTE — Progress Notes (Signed)
Safety precautions to be maintained throughout the outpatient stay will include: orient to surroundings, keep bed in low position, maintain call bell within reach at all times, provide assistance with transfer out of bed and ambulation.  

## 2021-06-14 ENCOUNTER — Telehealth: Payer: Self-pay

## 2021-06-14 NOTE — Telephone Encounter (Signed)
Post procedure phone call Patient states she is doing good, just a little sore.

## 2021-06-21 ENCOUNTER — Other Ambulatory Visit: Payer: Self-pay

## 2021-06-21 MED FILL — Ibuprofen Tab 800 MG: ORAL | 10 days supply | Qty: 30 | Fill #8 | Status: AC

## 2021-06-26 ENCOUNTER — Other Ambulatory Visit: Payer: Self-pay

## 2021-06-26 MED ORDER — AMPHETAMINE-DEXTROAMPHETAMINE 30 MG PO TABS
1.0000 | ORAL_TABLET | Freq: Two times a day (BID) | ORAL | 0 refills | Status: AC
  Filled 2021-06-26 – 2021-07-23 (×2): qty 180, 90d supply, fill #0
  Filled 2021-10-22: qty 160, 80d supply, fill #0

## 2021-06-26 MED FILL — Lamotrigine Tab 200 MG: ORAL | 30 days supply | Qty: 30 | Fill #4 | Status: AC

## 2021-06-27 ENCOUNTER — Other Ambulatory Visit: Payer: Self-pay

## 2021-07-02 ENCOUNTER — Encounter: Payer: 59 | Admitting: Pain Medicine

## 2021-07-04 ENCOUNTER — Other Ambulatory Visit: Payer: Self-pay

## 2021-07-04 MED FILL — Ibuprofen Tab 800 MG: ORAL | 10 days supply | Qty: 30 | Fill #9 | Status: AC

## 2021-07-11 ENCOUNTER — Other Ambulatory Visit: Payer: Self-pay

## 2021-07-12 ENCOUNTER — Other Ambulatory Visit: Payer: Self-pay

## 2021-07-15 ENCOUNTER — Other Ambulatory Visit: Payer: Self-pay

## 2021-07-15 MED ORDER — IBUPROFEN 800 MG PO TABS
ORAL_TABLET | Freq: Three times a day (TID) | ORAL | 11 refills | Status: DC | PRN
  Filled 2021-07-15: qty 30, 10d supply, fill #0
  Filled 2021-07-28 – 2021-07-29 (×2): qty 30, 10d supply, fill #1
  Filled 2021-08-10: qty 30, 10d supply, fill #2
  Filled 2021-08-18: qty 30, 10d supply, fill #3
  Filled 2021-09-09: qty 30, 10d supply, fill #4
  Filled 2021-09-18 – 2021-10-03 (×2): qty 30, 10d supply, fill #5
  Filled 2021-10-09: qty 30, 10d supply, fill #6
  Filled 2021-10-23: qty 30, 10d supply, fill #7
  Filled 2021-11-08: qty 30, 10d supply, fill #8
  Filled 2021-12-03: qty 30, 10d supply, fill #9
  Filled 2021-12-19: qty 30, 10d supply, fill #10
  Filled 2022-01-08: qty 30, 10d supply, fill #11

## 2021-07-17 ENCOUNTER — Encounter: Payer: 59 | Admitting: Pain Medicine

## 2021-07-18 ENCOUNTER — Other Ambulatory Visit: Payer: Self-pay

## 2021-07-18 ENCOUNTER — Ambulatory Visit: Payer: 59 | Attending: Pain Medicine | Admitting: Pain Medicine

## 2021-07-18 DIAGNOSIS — M5137 Other intervertebral disc degeneration, lumbosacral region: Secondary | ICD-10-CM | POA: Diagnosis not present

## 2021-07-18 DIAGNOSIS — M545 Low back pain, unspecified: Secondary | ICD-10-CM | POA: Diagnosis not present

## 2021-07-18 DIAGNOSIS — G8929 Other chronic pain: Secondary | ICD-10-CM | POA: Diagnosis not present

## 2021-07-18 DIAGNOSIS — M47816 Spondylosis without myelopathy or radiculopathy, lumbar region: Secondary | ICD-10-CM | POA: Diagnosis not present

## 2021-07-18 DIAGNOSIS — M47817 Spondylosis without myelopathy or radiculopathy, lumbosacral region: Secondary | ICD-10-CM | POA: Diagnosis not present

## 2021-07-18 DIAGNOSIS — R937 Abnormal findings on diagnostic imaging of other parts of musculoskeletal system: Secondary | ICD-10-CM

## 2021-07-18 NOTE — Patient Instructions (Signed)
______________________________________________________________________  Preparing for Procedure with Sedation  NOTICE: Due to recent regulatory changes, starting on June 03, 2021, procedures requiring intravenous (IV) sedation will no longer be performed at the [facility].  These types of procedures are required to be performed at [facility] ambulatory surgery facility.  We are very sorry for the inconvenience.  Procedure appointments are limited to planned procedures: No Prescription Refills. No disability issues will be discussed. No medication changes will be discussed.  Instructions: Oral Intake: Do not eat or drink anything for at least 8 hours prior to your procedure. (Exception: Blood Pressure Medication. See below.) Transportation: A driver is required. You may not drive yourself after the procedure. Blood Pressure Medicine: Do not forget to take your blood pressure medicine with a sip of water the morning of the procedure. If your Diastolic (lower reading) is above 100 mmHg, elective cases will be cancelled/rescheduled. Blood thinners: These will need to be stopped for procedures. Notify our staff if you are taking any blood thinners. Depending on which one you take, there will be specific instructions on how and when to stop it. Diabetics on insulin: Notify the staff so that you can be scheduled 1st case in the morning. If your diabetes requires high dose insulin, take only  of your normal insulin dose the morning of the procedure and notify the staff that you have done so. Preventing infections: Shower with an antibacterial soap the morning of your procedure. Build-up your immune system: Take 1000 mg of Vitamin C with every meal (3 times a day) the day prior to your procedure. Antibiotics: Inform the staff if you have a condition or reason that requires you to take antibiotics before dental procedures. Pregnancy: If you are pregnant, call and cancel the procedure. Sickness: If  you have a cold, fever, or any active infections, call and cancel the procedure. Arrival: You must be in the facility at least 30 minutes prior to your scheduled procedure. Children: Do not bring children with you. Dress appropriately: Bring dark clothing that you would not mind if they get stained. Valuables: Do not bring any jewelry or valuables.  Reasons to call and reschedule or cancel your procedure: (Following these recommendations will minimize the risk of a serious complication.) Surgeries: Avoid having procedures within 2 weeks of any surgery. (Avoid for 2 weeks before or after any surgery). Flu Shots: Avoid having procedures within 2 weeks of a flu shots. (Avoid for 2 weeks before or after immunizations). Barium: Avoid having a procedure within 7-10 days after having had a radiological study involving the use of radiological contrast. (Myelograms, Barium swallow or enema study). Heart attacks: Avoid any elective procedures or surgeries for the initial 6 months after a "Myocardial Infarction" (Heart Attack). Blood thinners: It is imperative that you stop these medications before procedures. Let us know if you if you take any blood thinner.  Infection: Avoid procedures during or within two weeks of an infection (including chest colds or gastrointestinal problems). Symptoms associated with infections include: Localized redness, fever, chills, night sweats or profuse sweating, burning sensation when voiding, cough, congestion, stuffiness, runny nose, sore throat, diarrhea, nausea, vomiting, cold or Flu symptoms, recent or current infections. It is specially important if the infection is over the area that we intend to treat. Heart and lung problems: Symptoms that may suggest an active cardiopulmonary problem include: cough, chest pain, breathing difficulties or shortness of breath, dizziness, ankle swelling, uncontrolled high or unusually low blood pressure, and/or palpitations. If you are  experiencing any of these symptoms, cancel your procedure and contact your primary care physician for an evaluation.  Remember:  Regular Business hours are:  Monday to Thursday 8:00 AM to 4:00 PM  Provider's Schedule: Milinda Pointer, MD:  Procedure days: Tuesday and Thursday 7:30 AM to 4:00 PM  Gillis Santa, MD:  Procedure days: Monday and Wednesday 7:30 AM to 4:00 PM ______________________________________________________________________  ____________________________________________________________________________________________  General Risks and Possible Complications  Patient Responsibilities: It is important that you read this as it is part of your informed consent. It is our duty to inform you of the risks and possible complications associated with treatments offered to you. It is your responsibility as a patient to read this and to ask questions about anything that is not clear or that you believe was not covered in this document.  Patient's Rights: You have the right to refuse treatment. You also have the right to change your mind, even after initially having agreed to have the treatment done. However, under this last option, if you wait until the last second to change your mind, you may be charged for the materials used up to that point.  Introduction: Medicine is not an Chief Strategy Officer. Everything in Medicine, including the lack of treatment(s), carries the potential for danger, harm, or loss (which is by definition: Risk). In Medicine, a complication is a secondary problem, condition, or disease that can aggravate an already existing one. All treatments carry the risk of possible complications. The fact that a side effects or complications occurs, does not imply that the treatment was conducted incorrectly. It must be clearly understood that these can happen even when everything is done following the highest safety standards.  No treatment: You can choose not to proceed with the  proposed treatment alternative. The "PRO(s)" would include: avoiding the risk of complications associated with the therapy. The "CON(s)" would include: not getting any of the treatment benefits. These benefits fall under one of three categories: diagnostic; therapeutic; and/or palliative. Diagnostic benefits include: getting information which can ultimately lead to improvement of the disease or symptom(s). Therapeutic benefits are those associated with the successful treatment of the disease. Finally, palliative benefits are those related to the decrease of the primary symptoms, without necessarily curing the condition (example: decreasing the pain from a flare-up of a chronic condition, such as incurable terminal cancer).  General Risks and Complications: These are associated to most interventional treatments. They can occur alone, or in combination. They fall under one of the following six (6) categories: no benefit or worsening of symptoms; bleeding; infection; nerve damage; allergic reactions; and/or death. No benefits or worsening of symptoms: In Medicine there are no guarantees, only probabilities. No healthcare provider can ever guarantee that a medical treatment will work, they can only state the probability that it may. Furthermore, there is always the possibility that the condition may worsen, either directly, or indirectly, as a consequence of the treatment. Bleeding: This is more common if the patient is taking a blood thinner, either prescription or over the counter (example: Goody Powders, Fish oil, Aspirin, Garlic, etc.), or if suffering a condition associated with impaired coagulation (example: Hemophilia, cirrhosis of the liver, low platelet counts, etc.). However, even if you do not have one on these, it can still happen. If you have any of these conditions, or take one of these drugs, make sure to notify your treating physician. Infection: This is more common in patients with a compromised  immune system, either due to disease (example:  diabetes, cancer, human immunodeficiency virus [HIV], etc.), or due to medications or treatments (example: therapies used to treat cancer and rheumatological diseases). However, even if you do not have one on these, it can still happen. If you have any of these conditions, or take one of these drugs, make sure to notify your treating physician. Nerve Damage: This is more common when the treatment is an invasive one, but it can also happen with the use of medications, such as those used in the treatment of cancer. The damage can occur to small secondary nerves, or to large primary ones, such as those in the spinal cord and brain. This damage may be temporary or permanent and it may lead to impairments that can range from temporary numbness to permanent paralysis and/or brain death. Allergic Reactions: Any time a substance or material comes in contact with our body, there is the possibility of an allergic reaction. These can range from a mild skin rash (contact dermatitis) to a severe systemic reaction (anaphylactic reaction), which can result in death. Death: In general, any medical intervention can result in death, most of the time due to an unforeseen complication. ____________________________________________________________________________________________

## 2021-07-18 NOTE — Progress Notes (Signed)
Patient: Tammy Deleon  Service Category: E/M  Provider: Gaspar Cola, MD  DOB: [birth year]  DOS: 07/18/2021  Location: Office  MRN: [id number]  Setting: Ambulatory outpatient  Referring Provider: Adin Hector, MD  Type: Established Patient  Specialty: Interventional Pain Management  PCP: Adin Hector, MD  Location: Remote location  Delivery: TeleHealth     Virtual Encounter - Pain Management PROVIDER NOTE: Information contained herein reflects review and annotations entered in association with encounter. Interpretation of such information and data should be left to medically-trained personnel. Information provided to patient can be located elsewhere in the medical record under "Patient Instructions". Document created using STT-dictation technology, any transcriptional errors that may result from process are unintentional.    Contact & Pharmacy Preferred: [phone] Home: [phone] (home) Mobile: There is no such number on file (mobile). E-mail: [email or web address]  [place] [address] Phone: [phone] Fax: [phone]   Pre-screening  [name] offered "in-person" vs "virtual" encounter. She indicated preferring virtual for this encounter.   Reason COVID-19*  Social distancing based on CDC and AMA recommendations.   I contacted [name] on 07/18/2021 via telephone.      I clearly identified myself as Gaspar Cola, MD. I verified that I was speaking with the correct person using two identifiers (Name: Tammy Deleon, and date of birth: [birth year]).  Consent I sought verbal advanced consent from [name] for virtual visit interactions. I informed Ms. Sylvain of possible security and privacy concerns, risks, and limitations associated with providing "not-in-person" medical evaluation and management services. I also informed Ms. Mccaul of the availability of "in-person"  appointments. Finally, I informed her that there would be a charge for the virtual visit and that she could be  personally, fully or partially, financially responsible for it. Ms. Newson expressed understanding and agreed to proceed.   Historic Elements   Tammy Deleon is a 35 y.o. year old, female patient evaluated today after our last contact on 06/13/2021. Tammy Deleon  has a past medical history of Anxiety, Depression, Irregular intermenstrual bleeding, and UTI (lower urinary tract infection). She also  has a past surgical history that includes Gallbladder surgery; Hemi-microdiscectomy lumbar laminectomy level 1 (Right, 01/02/2020); and Hemi-microdiscectomy lumbar laminectomy level 1 (Right, 11/05/2020). Ms. Booze has a current medication list which includes the following prescription(s): amphetamine-dextroamphetamine, diazepam, hydrocodone-acetaminophen, [START ON 08/08/2021] hydrocodone-acetaminophen, [START ON 09/07/2021] hydrocodone-acetaminophen, ibuprofen, lamotrigine, levonorgestrel-ethinyl estradiol, trazodone, valacyclovir, levonorgestrel-ethinyl estradiol, and phentermine. She  reports that she has never smoked. She has never used smokeless tobacco. She reports current alcohol use. She reports that she does not use drugs. Tammy Deleon is allergic to sulfa antibiotics.   HPI  Today, she is being contacted for a post-procedure assessment.  Today I called the patient to follow-up with the results of the Racz procedure.  She indicates having attained 75% relief of her low back pain that seem to have lasted for a couple of days after which the pain simply came back.  Today we went over her current symptoms and she indicates that the pain is primarily over the lower portion of her back and buttocks area.  She describes that the right side used to be the worst, but some of that has improved.  However, she is currently not having any pain or numbness going down the legs and is strictly in the  lower portion of the back.  Today we went over  and reviewed the results of the MRI that she had done on 02/04/2021 which reveals an interval right laminectomy and resection of a large extruded disc fragment on the right side.  She still has a small disc protrusion that its present on the right side with epidural enhancement around the right S1 nerve root.  Having said that, she is not having any symptoms in the distribution of the S1 nerve.  In addition, the MRI indicates that she has some mild facet degeneration as well as some disc space narrowing and endplate spurring present with mild subarticular stenosis bilaterally.  Again, subarticular stenosis at the L5-S1 level would affect primarily the L5 and S1 nerve roots, but she is not having any symptoms in either 1 of those dermatomal distributions.  This brings [place] to the facet degeneration.  Based on the patient's description of her pain there is a possibility that she may be having pain that is coming from the lumbar facets.  For this reason today I have asked the patient to perform at home a Lynelle Smoke maneuver which she indicated was positive for exact reproduction of her low back pain, bilaterally.  Once more, this would be highly suggestive of the possibility that we are dealing with bilateral lumbar facet joint pain at the L5-S1 level.  For this reason, we have decided to bring her in for a diagnostic bilateral L4-S1 medial branch blocks to determine if the pain that she is experiencing is coming from the facet joints.  If it is, then we will go ahead and proceed with radiofrequency ablation.  If it is not, then we will look at the possibility of doing a spinal cord stimulator trial and if that fails, we will look into the possibility of an intrathecal pump.  In any case, I have shared this information with the patient who understood and accepted.  Post-Procedure Evaluation  Procedure (06/13/2021):  Procedure:           Anesthesia, Analgesia, Anxiolysis:  Type:  Therapeutic Percutaneous Epidural Neuroplasty and Lysis of Adhesions (RACZ Procedure)           Region: Caudal Level: Sacrococcygeal   Laterality: Midline          Type: Minimal (Conscious) Anxiolysis combined with Local Anesthesia Indication(s): Analgesia and Anxiety Route: Intravenous (IV) IV Access: Secured Sedation: Meaningful verbal contact was maintained at all times during the procedure  Local Anesthetic: Lidocaine 1-2%   Position: Prone    Indications: 1. Failed back surgical syndrome   2. Chronic low back pain (1ry area of Pain) (Right) w/o sciatica   3. Chronic lower extremity pain (intermittent) (2ry area of Pain) (Right)   4. DDD (degenerative disc disease), lumbosacral   5. Epidural fibrosis (S1) (Right)   6. History of lumbar laminectomy x2   7. Lumbosacral lateral recess stenosis (L5-S1) (Bilateral)     Pain Score: Pre-procedure: 7 /10 Post-procedure: 4 /10   Anxiolysis: Minimal anxiolysis  Effectiveness during initial hour after procedure (Ultra-Short Term Relief): 75 %.  Local anesthetic used: Long-acting (4-6 hours) Effectiveness: Defined as any analgesic benefit obtained secondary to the administration of local anesthetics. This carries significant diagnostic value as to the etiological location, or anatomical origin, of the pain. Duration of benefit is expected to coincide with the duration of the local anesthetic used.  Effectiveness during initial 4-6 hours after procedure (Short-Term Relief): 75 %.  Long-term benefit: Defined as any relief past the pharmacologic duration of the local anesthetics.  Effectiveness past the initial  6 hours after procedure (Long-Term Relief): 75 % (lasting a couple of days).  Benefits, current: Defined as benefit present at the time of this evaluation.   Analgesia: The patient indicates that the benefit that she had from the procedure has been wearing off and currently the pain is the same as it was before the Racz  procedure. Function: Back to baseline ROM: Back to baseline  Pharmacotherapy Assessment   Analgesic: Hydrocodone/APAP 5/325, 1 tab p.o. 4 times daily (#56) (filled 02/27/2021) MME/day: 20 mg/day   Monitoring: [place] PMP: PDMP reviewed during this encounter.       Pharmacotherapy: No side-effects or adverse reactions reported. Compliance: No problems identified. Effectiveness: Clinically acceptable. Plan: Refer to "POC". UDS:  Summary  Date Value Ref Range Status  03/13/2021 Note  Final    Comment:    ==================================================================== Compliance Drug Analysis, Ur ==================================================================== Test                             Result       Flag       Units  Drug Present and Declared for Prescription Verification   Hydrocodone                    598          EXPECTED   ng/mg creat   Hydromorphone                  38           EXPECTED   ng/mg creat   Dihydrocodeine                 139          EXPECTED   ng/mg creat   Norhydrocodone                 504          EXPECTED   ng/mg creat    Sources of hydrocodone include scheduled prescription medications.    Hydromorphone, dihydrocodeine and norhydrocodone are expected    metabolites of hydrocodone. Hydromorphone and dihydrocodeine are    also available as scheduled prescription medications.    Lamotrigine                    PRESENT      EXPECTED   Trazodone                      PRESENT      EXPECTED   1,3 chlorophenyl piperazine    PRESENT      EXPECTED    1,3-chlorophenyl piperazine is an expected metabolite of trazodone.    Acetaminophen                  PRESENT      EXPECTED   Ibuprofen                      PRESENT      EXPECTED  Drug Present not Declared for Prescription Verification   Tramadol                       >3759        UNEXPECTED ng/mg creat   O-Desmethyltramadol            >3759        UNEXPECTED ng/mg creat   N-Desmethyltramadol  1017          UNEXPECTED ng/mg creat    Source of tramadol is a prescription medication. O-desmethyltramadol    and N-desmethyltramadol are expected metabolites of tramadol.    Naproxen                       PRESENT      UNEXPECTED  Drug Absent but Declared for Prescription Verification   Amphetamine                    Not Detected UNEXPECTED ng/mg creat   Phentermine                    Not Detected UNEXPECTED ==================================================================== Test                      Result    Flag   Units      Ref Range   Creatinine              133              mg/dL      >=20 ==================================================================== Declared Medications:  The flagging and interpretation on this report are based on the  following declared medications.  Unexpected results may arise from  inaccuracies in the declared medications.   **Note: The testing scope of this panel includes these medications:   Amphetamine (Adderall)  Hydrocodone (Norco)  Lamotrigine (Lamictal)  Phentermine (Adipex)  Trazodone (Desyrel)   **Note: The testing scope of this panel does not include small to  moderate amounts of these reported medications:   Acetaminophen (Norco)  Ibuprofen (Advil)   **Note: The testing scope of this panel does not include the  following reported medications:   Ethinyl Estradiol  Levonorgestrel  Valacyclovir (Valtrex) ==================================================================== For clinical consultation, please call [phone]. ====================================================================      Laboratory Chemistry Profile   Renal Lab Results  Component Value Date   BUN 15 03/13/2021   CREATININE 0.89 03/13/2021   LABCREA 208 09/21/2019   BCR 17 03/13/2021   GFRAA >60 12/30/2019   GFRNONAA >60 10/29/2020    Hepatic Lab Results  Component Value Date   AST 18 03/13/2021   ALT 11 09/22/2019   ALBUMIN 4.4 03/13/2021    ALKPHOS 67 03/13/2021   LIPASE 80 03/17/2012    Electrolytes Lab Results  Component Value Date   NA 139 03/13/2021   K 4.7 03/13/2021   CL 103 03/13/2021   CALCIUM 9.9 03/13/2021   MG 1.9 03/13/2021    Bone Lab Results  Component Value Date   25OHVITD1 50 03/13/2021   25OHVITD2 <1.0 03/13/2021   25OHVITD3 50 03/13/2021    Inflammation (CRP: Acute Phase) (ESR: Chronic Phase) Lab Results  Component Value Date   CRP 9 03/13/2021   ESRSEDRATE 14 03/13/2021         Note: Above Lab results reviewed.  Imaging  DG PAIN CLINIC C-ARM 1-60 MIN NO REPORT Fluoro was used, but no Radiologist interpretation will be provided.  Please refer to "NOTES" tab for provider progress note.  Assessment  The primary encounter diagnosis was Abnormal MRI, lumbar spine (02/04/2021). Diagnoses of Chronic low back pain (1ry area of Pain) (Right) w/o sciatica, DDD (degenerative disc disease), lumbosacral, Lumbosacral facet arthropathy (L5-S1), Lumbar facet joint syndrome (Right), and Spondylosis without myelopathy or radiculopathy, lumbosacral region were also pertinent to this visit.  Plan of Care  Problem-specific:  No problem-specific  Assessment & Plan notes found for this encounter.  Ms. LAURI PURDUM has a current medication list which includes the following long-term medication(s): amphetamine-dextroamphetamine, hydrocodone-acetaminophen, [START ON 08/08/2021] hydrocodone-acetaminophen, [START ON 09/07/2021] hydrocodone-acetaminophen, lamotrigine, levonorgestrel-ethinyl estradiol, trazodone, levonorgestrel-ethinyl estradiol, and phentermine.  Pharmacotherapy (Medications Ordered): No orders of the defined types were placed in this encounter.  Orders:  Orders Placed This Encounter  Procedures   LUMBAR FACET(MEDIAL BRANCH NERVE BLOCK) MBNB    Standing Status:   Future    Standing Expiration Date:   10/17/2021    Scheduling Instructions:     Procedure: Lumbar facet block (AKA.:  Lumbosacral medial branch nerve block)     Side: Bilateral     Level: L3-4, L4-5, & L5-S1 Facets (L2, L3, L4, L5, & S1 Medial Branch Nerves)     Sedation: Patient's choice.     Timeframe: ASAA    Order Specific Question:   Where will this procedure be performed?    Answer:   [facility]   Follow-up plan:   Return for Proc-day (T,Th), (Clinic) procedure: (B) L-FCT BLK #1, (w/ anxiolysis).     Interventional Therapies  Risk  Complexity Considerations:   Estimated body mass index is 29.63 kg/m as calculated from the following:   Height as of this encounter: _0  (1.575 m).   Weight as of this encounter: 162 lb (73.5 kg). WNL   Planned  Pending:   Diagnostic right caudal ESI #1 + diagnostic epidurogram    Under consideration:   Possible candidate for right-sided Racz procedure  Diagnostic right lumbar facet block  Possible candidate for right thoracolumbar spinal cord stimulator trial/implant    Completed:   Diagnostic right SI joint block x1 (04/16/2021)    Completed by Dr. Sharlet Salina: Right L5-S1 TFESI x1 (12/07/2019)  Right S1 tTFESI x1 (12/07/2019)    Therapeutic  Palliative (PRN) options:   None established       Recent Visits Date Type Provider Dept  06/13/21 Procedure visit Milinda Pointer, MD [facility]  05/30/21 Office Visit Milinda Pointer, MD [facility]  05/16/21 Procedure visit Milinda Pointer, MD [facility]  04/30/21 Office Visit Milinda Pointer, MD [facility]  Showing recent visits within past 90 days and meeting all other requirements Today's Visits Date Type Provider Dept  07/18/21 Telemedicine Milinda Pointer, MD [facility]  Showing today's visits and meeting all other requirements Future Appointments Date Type Provider Dept  10/02/21 Appointment Milinda Pointer, MD [facility]  Showing future appointments within next 90 days and meeting all other  requirements I discussed the assessment and treatment plan with the patient. The patient was provided an opportunity to ask questions and all were answered. The patient agreed with the plan and demonstrated an understanding of the instructions.  Patient advised to call back or seek an in-person evaluation if the symptoms or condition worsens.  Duration of encounter: 30 minutes.  Note by: Gaspar Cola, MD Date: 07/18/2021; Time: 4:42 PM

## 2021-07-19 ENCOUNTER — Telehealth: Payer: Self-pay

## 2021-07-19 NOTE — Telephone Encounter (Signed)
LM for patient to call office regarding pre procedure instructions.  If you call to schedule, please send her back to a nurse to see if with or without sedation, blood thinner status.  Thanks

## 2021-07-19 NOTE — Telephone Encounter (Signed)
Instructions given via phone.  Witih sedation, no blood thinners.

## 2021-07-24 ENCOUNTER — Other Ambulatory Visit: Payer: Self-pay

## 2021-07-28 MED FILL — Lamotrigine Tab 200 MG: ORAL | 30 days supply | Qty: 30 | Fill #5 | Status: CN

## 2021-07-29 ENCOUNTER — Other Ambulatory Visit: Payer: Self-pay

## 2021-07-29 MED FILL — Lamotrigine Tab 200 MG: ORAL | 30 days supply | Qty: 30 | Fill #5 | Status: AC

## 2021-08-05 ENCOUNTER — Other Ambulatory Visit: Payer: Self-pay

## 2021-08-05 DIAGNOSIS — E669 Obesity, unspecified: Secondary | ICD-10-CM | POA: Diagnosis not present

## 2021-08-05 MED ORDER — PHENTERMINE HCL 37.5 MG PO TABS
ORAL_TABLET | ORAL | 0 refills | Status: AC
  Filled 2021-08-05: qty 30, 30d supply, fill #0

## 2021-08-05 NOTE — Progress Notes (Deleted)
PROVIDER NOTE: Information contained herein reflects review and annotations entered in association with encounter. Interpretation of such information and data should be left to medically-trained personnel. Information provided to patient can be located elsewhere in the medical record under "Patient Instructions". Document created using STT-dictation technology, any transcriptional errors that may result from process are unintentional.    Patient: Tammy Deleon  Service Category: Procedure  Provider: Gaspar Cola, MD  DOB: [birth year]  DOS: 08/06/2021  Location: [facility]  MRN: [id number]  Setting: Ambulatory - outpatient  Referring Provider: Adin Hector, MD  Type: Established Patient  Specialty: Interventional Pain Management  PCP: Adin Hector, MD   Primary Reason for Visit: Interventional Pain Management Treatment. CC: No chief complaint on file.    Procedure:          Anesthesia, Analgesia, Anxiolysis:  Type: Lumbar Facet, Medial Branch Block(s) #1  Primary Purpose: Diagnostic Region: Posterolateral Lumbosacral Spine Level: L2, L3, L4, L5, & S1 Medial Branch Level(s). Injecting these levels blocks the L3-4, L4-5, and L5-S1 lumbar facet joints. Laterality: Bilateral  Type: Local Anesthesia Local Anesthetic: Lidocaine 1-2% Sedation: Minimal Anxiolysis  Indication(s): Anxiety & Analgesia Route: Infiltration ([place]/IM) IV Access: Available   Position: Prone   Indications: No diagnosis found. Pain Score: Pre-procedure:  /10 Post-procedure:  /10     Pre-op H&P Assessment:  Tammy Deleon is a 35 y.o. (year old), female patient, seen today for interventional treatment. She  has a past surgical history that includes Gallbladder surgery; Hemi-microdiscectomy lumbar laminectomy level 1 (Right, 01/02/2020); and Hemi-microdiscectomy lumbar laminectomy level 1 (Right, 11/05/2020). Tammy Deleon has a current medication list which includes the following  prescription(s): amphetamine-dextroamphetamine, diazepam, hydrocodone-acetaminophen, [START ON 08/08/2021] hydrocodone-acetaminophen, [START ON 09/07/2021] hydrocodone-acetaminophen, ibuprofen, lamotrigine, levonorgestrel-ethinyl estradiol, levonorgestrel-ethinyl estradiol, phentermine, trazodone, and valacyclovir. Her primarily concern today is the No chief complaint on file.  Initial Vital Signs:  Pulse/HCG Rate:    Temp:   Resp:   BP:   SpO2:    BMI: Estimated body mass index is 30 kg/m as calculated from the following:   Height as of 06/13/21: 5' 2"  (1.575 m).   Weight as of 06/13/21: 164 lb (74.4 kg).  Risk Assessment: Allergies: Reviewed. She is allergic to sulfa antibiotics.  Allergy Precautions: None required Coagulopathies: Reviewed. None identified.  Blood-thinner therapy: None at this time Active Infection(s): Reviewed. None identified. Tammy Deleon is afebrile  Site Confirmation: Tammy Deleon was asked to confirm the procedure and laterality before marking the site Procedure checklist: Completed Consent: Before the procedure and under the influence of no sedative(s), amnesic(s), or anxiolytics, the patient was informed of the treatment options, risks and possible complications. To fulfill our ethical and legal obligations, as recommended by the American Medical Association's Code of Ethics, I have informed the patient of my clinical impression; the nature and purpose of the treatment or procedure; the risks, benefits, and possible complications of the intervention; the alternatives, including doing nothing; the risk(s) and benefit(s) of the alternative treatment(s) or procedure(s); and the risk(s) and benefit(s) of doing nothing. The patient was provided information about the general risks and possible complications associated with the procedure. These may include, but are not limited to: failure to achieve desired goals, infection, bleeding, organ or nerve damage, allergic reactions,  paralysis, and death. In addition, the patient was informed of those risks and complications associated to Spine-related procedures, such as failure to decrease pain; infection (i.e.: Meningitis, epidural or intraspinal abscess); bleeding (i.e.: epidural  hematoma, subarachnoid hemorrhage, or any other type of intraspinal or peri-dural bleeding); organ or nerve damage (i.e.: Any type of peripheral nerve, nerve root, or spinal cord injury) with subsequent damage to sensory, motor, and/or autonomic systems, resulting in permanent pain, numbness, and/or weakness of one or several areas of the body; allergic reactions; (i.e.: anaphylactic reaction); and/or death. Furthermore, the patient was informed of those risks and complications associated with the medications. These include, but are not limited to: allergic reactions (i.e.: anaphylactic or anaphylactoid reaction(s)); adrenal axis suppression; blood sugar elevation that in diabetics may result in ketoacidosis or comma; water retention that in patients with history of congestive heart failure may result in shortness of breath, pulmonary edema, and decompensation with resultant heart failure; weight gain; swelling or edema; medication-induced neural toxicity; particulate matter embolism and blood vessel occlusion with resultant organ, and/or nervous system infarction; and/or aseptic necrosis of one or more joints. Finally, the patient was informed that Medicine is not an exact science; therefore, there is also the possibility of unforeseen or unpredictable risks and/or possible complications that may result in a catastrophic outcome. The patient indicated having understood very clearly. We have given the patient no guarantees and we have made no promises. Enough time was given to the patient to ask questions, all of which were answered to the patient's satisfaction. Tammy Deleon has indicated that she wanted to continue with the procedure. Attestation: I, the  ordering provider, attest that I have discussed with the patient the benefits, risks, side-effects, alternatives, likelihood of achieving goals, and potential problems during recovery for the procedure that I have provided informed consent. Date  Time: {CHL [facility]-PAIN TIME CHOICES:[id number]}  Pre-Procedure Preparation:  Monitoring: As per clinic protocol. Respiration, ETCO2, SpO2, BP, heart rate and rhythm monitor placed and checked for adequate function Safety Precautions: Patient was assessed for positional comfort and pressure points before starting the procedure. Time-out: I initiated and conducted the "Time-out" before starting the procedure, as per protocol. The patient was asked to participate by confirming the accuracy of the "Time Out" information. Verification of the correct person, site, and procedure were performed and confirmed by me, the nursing staff, and the patient. "Time-out" conducted as per Joint Commission's Universal Protocol (UP.01.01.01). Time:    Description of Procedure:          Laterality: Bilateral. The procedure was performed in identical fashion on both sides. Levels:  L2, L3, L4, L5, & S1 Medial Branch Level(s) Area Prepped: Posterior Lumbosacral Region DuraPrep (Iodine Povacrylex [0.7% available iodine] and Isopropyl Alcohol, 74% w/w) Safety Precautions: Aspiration looking for blood return was conducted prior to all injections. At no point did we inject any substances, as a needle was being advanced. Before injecting, the patient was told to immediately notify me if she was experiencing any new onset of "ringing in the ears, or metallic taste in the mouth". No attempts were made at seeking any paresthesias. Safe injection practices and needle disposal techniques used. Medications properly checked for expiration dates. SDV (single dose vial) medications used. After the completion of the procedure, all disposable equipment used was discarded in the proper designated  medical waste containers. Local Anesthesia: Protocol guidelines were followed. The patient was positioned over the fluoroscopy table. The area was prepped in the usual manner. The time-out was completed. The target area was identified using fluoroscopy. A 12-in long, straight, sterile hemostat was used with fluoroscopic guidance to locate the targets for each level blocked. Once located, the skin was marked with an  approved surgical skin marker. Once all sites were marked, the skin (epidermis, dermis, and hypodermis), as well as deeper tissues (fat, connective tissue and muscle) were infiltrated with a small amount of a short-acting local anesthetic, loaded on a 10cc syringe with a 25G, 1.5-in  Needle. An appropriate amount of time was allowed for local anesthetics to take effect before proceeding to the next step. Local Anesthetic: Lidocaine 2.0% The unused portion of the local anesthetic was discarded in the proper designated containers. Technical explanation of process:  L2 Medial Branch Nerve Block (MBB): The target area for the L2 medial branch is at the junction of the postero-lateral aspect of the superior articular process and the superior, posterior, and medial edge of the transverse process of L3. Under fluoroscopic guidance, a Quincke needle was inserted until contact was made with os over the superior postero-lateral aspect of the pedicular shadow (target area). After negative aspiration for blood, 0.5 mL of the nerve block solution was injected without difficulty or complication. The needle was removed intact. L3 Medial Branch Nerve Block (MBB): The target area for the L3 medial branch is at the junction of the postero-lateral aspect of the superior articular process and the superior, posterior, and medial edge of the transverse process of L4. Under fluoroscopic guidance, a Quincke needle was inserted until contact was made with os over the superior postero-lateral aspect of the pedicular shadow  (target area). After negative aspiration for blood, 0.5 mL of the nerve block solution was injected without difficulty or complication. The needle was removed intact. L4 Medial Branch Nerve Block (MBB): The target area for the L4 medial branch is at the junction of the postero-lateral aspect of the superior articular process and the superior, posterior, and medial edge of the transverse process of L5. Under fluoroscopic guidance, a Quincke needle was inserted until contact was made with os over the superior postero-lateral aspect of the pedicular shadow (target area). After negative aspiration for blood, 0.5 mL of the nerve block solution was injected without difficulty or complication. The needle was removed intact. L5 Medial Branch Nerve Block (MBB): The target area for the L5 medial branch is at the junction of the postero-lateral aspect of the superior articular process and the superior, posterior, and medial edge of the sacral ala. Under fluoroscopic guidance, a Quincke needle was inserted until contact was made with os over the superior postero-lateral aspect of the pedicular shadow (target area). After negative aspiration for blood, 0.5 mL of the nerve block solution was injected without difficulty or complication. The needle was removed intact. S1 Medial Branch Nerve Block (MBB): The target area for the S1 medial branch is at the posterior and inferior 6 o'clock position of the L5-S1 facet joint. Under fluoroscopic guidance, the Quincke needle inserted for the L5 MBB was redirected until contact was made with os over the inferior and postero aspect of the sacrum, at the 6 o' clock position under the L5-S1 facet joint (Target area). After negative aspiration for blood, 0.5 mL of the nerve block solution was injected without difficulty or complication. The needle was removed intact.  Nerve block solution: 0.2% PF-Ropivacaine + Triamcinolone (40 mg/mL) diluted to a final concentration of 4 mg of  Triamcinolone/mL of Ropivacaine The unused portion of the solution was discarded in the proper designated containers. Procedural Needles: 22-gauge, 3.5-inch, Quincke needles used for all levels.  Once the entire procedure was completed, the treated area was cleaned, making sure to leave some of the prepping solution back  to take advantage of its long term bactericidal properties.      Illustration of the posterior view of the lumbar spine and the posterior neural structures. Laminae of L2 through S1 are labeled. DPRL5, dorsal primary ramus of L5; DPRS1, dorsal primary ramus of S1; DPR3, dorsal primary ramus of L3; FJ, facet (zygapophyseal) joint L3-L4; I, inferior articular process of L4; LB1, lateral branch of dorsal primary ramus of L1; IAB, inferior articular branches from L3 medial branch (supplies L4-L5 facet joint); IBP, intermediate branch plexus; MB3, medial branch of dorsal primary ramus of L3; NR3, third lumbar nerve root; S, superior articular process of L5; SAB, superior articular branches from L4 (supplies L4-5 facet joint also); TP3, transverse process of L3.  There were no vitals filed for this visit.   Start Time:   hrs. End Time:   hrs.  Imaging Guidance (Spinal):          Type of Imaging Technique: Fluoroscopy Guidance (Spinal) Indication(s): Assistance in needle guidance and placement for procedures requiring needle placement in or near specific anatomical locations not easily accessible without such assistance. Exposure Time: Please see nurses notes. Contrast: None used. Fluoroscopic Guidance: I was personally present during the use of fluoroscopy. "Tunnel Vision Technique" used to obtain the best possible view of the target area. Parallax error corrected before commencing the procedure. "Direction-depth-direction" technique used to introduce the needle under continuous pulsed fluoroscopy. Once target was reached, antero-posterior, oblique, and lateral fluoroscopic projection  used confirm needle placement in all planes. Images permanently stored in EMR. Interpretation: No contrast injected. I personally interpreted the imaging intraoperatively. Adequate needle placement confirmed in multiple planes. Permanent images saved into the patient's record.  Antibiotic Prophylaxis:   Anti-infectives (From admission, onward)    None      Indication(s): None identified  Post-operative Assessment:  Post-procedure Vital Signs:  Pulse/HCG Rate:    Temp:   Resp:   BP:   SpO2:    EBL: None  Complications: No immediate post-treatment complications observed by team, or reported by patient.  Note: The patient tolerated the entire procedure well. A repeat set of vitals were taken after the procedure and the patient was kept under observation following institutional policy, for this type of procedure. Post-procedural neurological assessment was performed, showing return to baseline, prior to discharge. The patient was provided with post-procedure discharge instructions, including a section on how to identify potential problems. Should any problems arise concerning this procedure, the patient was given instructions to immediately contact us, at any time, without hesitation. In any case, we plan to contact the patient by telephone for a follow-up status report regarding this interventional procedure.  Comments:  No additional relevant information.  Plan of Care  Orders:  No orders of the defined types were placed in this encounter.  Chronic Opioid Analgesic:  Hydrocodone/APAP 5/325, 1 tab p.o. 4 times daily (#56) (filled 02/27/2021) MME/day: 20 mg/day   Medications ordered for procedure: No orders of the defined types were placed in this encounter.  Medications administered: Bernece D. Liebert had no medications administered during this visit.  See the medical record for exact dosing, route, and time of administration.  Follow-up plan:   No follow-ups on file.        Interventional Therapies  Risk  Complexity Considerations:   Estimated body mass index is 29.63 kg/m as calculated from the following:   Height as of this encounter: 5' 2"  (1.575 m).   Weight as of this encounter: 162 lb (73.5  kg). WNL   Planned  Pending:   Diagnostic right caudal ESI #1 + diagnostic epidurogram    Under consideration:   Possible candidate for right-sided Racz procedure  Diagnostic right lumbar facet block  Possible candidate for right thoracolumbar spinal cord stimulator trial/implant    Completed:   Diagnostic right SI joint block x1 (04/16/2021)    Completed by Dr. Sharlet Salina: Right L5-S1 TFESI x1 (12/07/2019)  Right S1 tTFESI x1 (12/07/2019)    Therapeutic  Palliative (PRN) options:   None established        Recent Visits Date Type Provider Dept  07/18/21 Telemedicine Milinda Pointer, MD [facility]  06/13/21 Procedure visit Milinda Pointer, MD [facility]  05/30/21 Office Visit Milinda Pointer, MD [facility]  05/16/21 Procedure visit Milinda Pointer, MD [facility]  Showing recent visits within past 90 days and meeting all other requirements Future Appointments Date Type Provider Dept  08/06/21 Appointment Milinda Pointer, MD [facility]  10/02/21 Appointment Milinda Pointer, MD [facility]  Showing future appointments within next 90 days and meeting all other requirements  Disposition: Discharge home  Discharge (Date  Time): 08/06/2021;   hrs.   Primary Care Physician: Adin Hector, MD Location: [facility] Note by: Gaspar Cola, MD Date: 08/06/2021; Time: 5:50 AM  Disclaimer:  Medicine is not an Chief Strategy Officer. The only guarantee in medicine is that nothing is guaranteed. It is important to note that the decision to proceed with this intervention was based on the information collected from the patient. The Data and  conclusions were drawn from the patient's questionnaire, the interview, and the physical examination. Because the information was provided in large part by the patient, it cannot be guaranteed that it has not been purposely or unconsciously manipulated. Every effort has been made to obtain as much relevant data as possible for this evaluation. It is important to note that the conclusions that lead to this procedure are derived in large part from the available data. Always take into account that the treatment will also be dependent on availability of resources and existing treatment guidelines, considered by other Pain Management Practitioners as being common knowledge and practice, at the time of the intervention. For Medico-Legal purposes, it is also important to point out that variation in procedural techniques and pharmacological choices are the acceptable norm. The indications, contraindications, technique, and results of the above procedure should only be interpreted and judged by a Board-Certified Interventional Pain Specialist with extensive familiarity and expertise in the same exact procedure and technique.

## 2021-08-06 ENCOUNTER — Ambulatory Visit: Payer: 59 | Admitting: Pain Medicine

## 2021-08-09 ENCOUNTER — Other Ambulatory Visit: Payer: Self-pay

## 2021-08-12 ENCOUNTER — Other Ambulatory Visit: Payer: Self-pay

## 2021-08-14 ENCOUNTER — Other Ambulatory Visit: Payer: Self-pay

## 2021-08-19 ENCOUNTER — Other Ambulatory Visit: Payer: Self-pay

## 2021-08-20 ENCOUNTER — Other Ambulatory Visit: Payer: Self-pay

## 2021-08-20 ENCOUNTER — Ambulatory Visit: Payer: 59 | Admitting: Pain Medicine

## 2021-08-30 ENCOUNTER — Other Ambulatory Visit: Payer: Self-pay

## 2021-08-30 MED FILL — Trazodone HCl Tab 100 MG: ORAL | 90 days supply | Qty: 90 | Fill #2 | Status: AC

## 2021-09-03 ENCOUNTER — Other Ambulatory Visit: Payer: Self-pay

## 2021-09-03 DIAGNOSIS — E663 Overweight: Secondary | ICD-10-CM | POA: Diagnosis not present

## 2021-09-03 MED FILL — Valacyclovir HCl Tab 500 MG: ORAL | 6 days supply | Qty: 12 | Fill #1 | Status: AC

## 2021-09-04 ENCOUNTER — Other Ambulatory Visit: Payer: Self-pay

## 2021-09-04 MED ORDER — LAMOTRIGINE 200 MG PO TABS
200.0000 mg | ORAL_TABLET | Freq: Every day | ORAL | 3 refills | Status: DC
  Filled 2021-09-04: qty 90, 90d supply, fill #0
  Filled 2022-02-25: qty 90, 90d supply, fill #1
  Filled 2022-06-27 – 2022-08-27 (×3): qty 90, 90d supply, fill #2

## 2021-09-05 ENCOUNTER — Other Ambulatory Visit: Payer: Self-pay

## 2021-09-09 ENCOUNTER — Other Ambulatory Visit: Payer: Self-pay

## 2021-09-10 ENCOUNTER — Other Ambulatory Visit: Payer: Self-pay

## 2021-09-11 ENCOUNTER — Other Ambulatory Visit: Payer: Self-pay

## 2021-09-16 NOTE — Progress Notes (Signed)
PROVIDER NOTE: Information contained herein reflects review and annotations entered in association with encounter. Interpretation of such information and data should be left to medically-trained personnel. Information provided to patient can be located elsewhere in the medical record under "Patient Instructions". Document created using STT-dictation technology, any transcriptional errors that may result from process are unintentional.    Patient: Tammy Deleon  Service Category: Procedure Provider: Gaspar Cola, MD DOB: 10/18/1986 DOS: 09/17/2021 Location: [facility] MRN: [id number] Setting: Ambulatory - outpatient Referring Provider: Adin Hector, MD Type: Established Patient Specialty: Interventional Pain Management PCP: Adin Hector, MD  Primary Reason for Visit: Interventional Pain Management Treatment. CC: Back Pain (low)   Procedure:          Type: Lumbar Facet, Medial Branch Block(s)          Primary Purpose: Diagnostic/Therapeutic Region: Posterolateral Lumbosacral Spine Level: L3, L4, L5, & S1 Medial Branch Level(s). Laterality: Right Anesthesia: Local (1-2% Lidocaine)  Anxiolysis: None  Sedation: None  Guidance: Fluoroscopy          Indications: 1. Lumbar facet joint syndrome (Right)   2. Lumbosacral facet arthropathy (L5-S1)   3. Spondylosis without myelopathy or radiculopathy, lumbosacral region   4. Chronic low back pain (1ry area of Pain) (Right) w/o sciatica   5. Chronic hip pain (Right)    Pain Score: Pre-procedure: 7 /10 Post-procedure: 2 /10    Position: Prone  Pre-op H&P Assessment:  Ms. Petre is a 35 y.o. (year old), female patient, seen today for interventional treatment. She  has a past surgical history that includes Gallbladder surgery; Hemi-microdiscectomy lumbar laminectomy level 1 (Right, 01/02/2020); and Hemi-microdiscectomy lumbar laminectomy level 1 (Right, 11/05/2020). Ms. Kalmar has a current medication list  which includes the following prescription(s): amphetamine-dextroamphetamine, hydrocodone-acetaminophen, ibuprofen, lamotrigine, levonorgestrel-ethinyl estradiol, phentermine, trazodone, valacyclovir, diazepam, hydrocodone-acetaminophen, hydrocodone-acetaminophen, lamotrigine, levonorgestrel-ethinyl estradiol, phentermine, and trazodone, and the following Facility-Administered Medications: lactated ringers. Her primarily concern today is the Back Pain (low)  Initial Vital Signs:  Pulse/HCG Rate: (!) 104ECG Heart Rate: 90 Temp: (!) 97.1 F (36.2 C) Resp: 16 BP: (!) 147/94 SpO2: 100 %  BMI: Estimated body mass index is 29.81 kg/m as calculated from the following:   Height as of this encounter: 5' 2"  (1.575 m).   Weight as of this encounter: 163 lb (73.9 kg).  Risk Assessment: Allergies: Reviewed. She is allergic to sulfa antibiotics.  Allergy Precautions: None required Coagulopathies: Reviewed. None identified.  Blood-thinner therapy: None at this time Active Infection(s): Reviewed. None identified. Ms. Smoots is afebrile  Site Confirmation: Ms. Angert was asked to confirm the procedure and laterality before marking the site Procedure checklist: Completed Consent: Before the procedure and under the influence of no sedative(s), amnesic(s), or anxiolytics, the patient was informed of the treatment options, risks and possible complications. To fulfill our ethical and legal obligations, as recommended by the American Medical Association's Code of Ethics, I have informed the patient of my clinical impression; the nature and purpose of the treatment or procedure; the risks, benefits, and possible complications of the intervention; the alternatives, including doing nothing; the risk(s) and benefit(s) of the alternative treatment(s) or procedure(s); and the risk(s) and benefit(s) of doing nothing. The patient was provided information about the general risks and possible complications associated  with the procedure. These may include, but are not limited to: failure to achieve desired goals, infection, bleeding, organ or nerve damage, allergic reactions, paralysis, and death. In addition, the patient was informed of  those risks and complications associated to Spine-related procedures, such as failure to decrease pain; infection (i.e.: Meningitis, epidural or intraspinal abscess); bleeding (i.e.: epidural hematoma, subarachnoid hemorrhage, or any other type of intraspinal or peri-dural bleeding); organ or nerve damage (i.e.: Any type of peripheral nerve, nerve root, or spinal cord injury) with subsequent damage to sensory, motor, and/or autonomic systems, resulting in permanent pain, numbness, and/or weakness of one or several areas of the body; allergic reactions; (i.e.: anaphylactic reaction); and/or death. Furthermore, the patient was informed of those risks and complications associated with the medications. These include, but are not limited to: allergic reactions (i.e.: anaphylactic or anaphylactoid reaction(s)); adrenal axis suppression; blood sugar elevation that in diabetics may result in ketoacidosis or comma; water retention that in patients with history of congestive heart failure may result in shortness of breath, pulmonary edema, and decompensation with resultant heart failure; weight gain; swelling or edema; medication-induced neural toxicity; particulate matter embolism and blood vessel occlusion with resultant organ, and/or nervous system infarction; and/or aseptic necrosis of one or more joints. Finally, the patient was informed that Medicine is not an exact science; therefore, there is also the possibility of unforeseen or unpredictable risks and/or possible complications that may result in a catastrophic outcome. The patient indicated having understood very clearly. We have given the patient no guarantees and we have made no promises. Enough time was given to the patient to ask  questions, all of which were answered to the patient's satisfaction. Ms. Donner has indicated that she wanted to continue with the procedure. Attestation: I, the ordering provider, attest that I have discussed with the patient the benefits, risks, side-effects, alternatives, likelihood of achieving goals, and potential problems during recovery for the procedure that I have provided informed consent. Date  Time: 09/17/2021  9:01 AM  Pre-Procedure Preparation:  Monitoring: As per clinic protocol. Respiration, ETCO2, SpO2, BP, heart rate and rhythm monitor placed and checked for adequate function Safety Precautions: Patient was assessed for positional comfort and pressure points before starting the procedure. Time-out: I initiated and conducted the "Time-out" before starting the procedure, as per protocol. The patient was asked to participate by confirming the accuracy of the "Time Out" information. Verification of the correct person, site, and procedure were performed and confirmed by me, the nursing staff, and the patient. "Time-out" conducted as per Joint Commission's Universal Protocol (UP.01.01.01). Time: 0940  Description of Procedure:          Laterality: Right Levels:  L3, L4, L5, & S1 Medial Branch Level(s) Area Prepped: Posterior Lumbosacral Region DuraPrep (Iodine Povacrylex [0.7% available iodine] and Isopropyl Alcohol, 74% w/w) Safety Precautions: Aspiration looking for blood return was conducted prior to all injections. At no point did we inject any substances, as a needle was being advanced. Before injecting, the patient was told to immediately notify me if she was experiencing any new onset of "ringing in the ears, or metallic taste in the mouth". No attempts were made at seeking any paresthesias. Safe injection practices and needle disposal techniques used. Medications properly checked for expiration dates. SDV (single dose vial) medications used. After the completion of the  procedure, all disposable equipment used was discarded in the proper designated medical waste containers. Local Anesthesia: Protocol guidelines were followed. The patient was positioned over the fluoroscopy table. The area was prepped in the usual manner. The time-out was completed. The target area was identified using fluoroscopy. A 12-in long, straight, sterile hemostat was used with fluoroscopic guidance to locate the targets  for each level blocked. Once located, the skin was marked with an approved surgical skin marker. Once all sites were marked, the skin (epidermis, dermis, and hypodermis), as well as deeper tissues (fat, connective tissue and muscle) were infiltrated with a small amount of a short-acting local anesthetic, loaded on a 10cc syringe with a 25G, 1.5-in  Needle. An appropriate amount of time was allowed for local anesthetics to take effect before proceeding to the next step. Local Anesthetic: Lidocaine 2.0% The unused portion of the local anesthetic was discarded in the proper designated containers. Technical explanation of process:   L3 Medial Branch Nerve Block (MBB): The target area for the L3 medial branch is at the junction of the postero-lateral aspect of the superior articular process and the superior, posterior, and medial edge of the transverse process of L4. Under fluoroscopic guidance, a Quincke needle was inserted until contact was made with os over the superior postero-lateral aspect of the pedicular shadow (target area). After negative aspiration for blood, 0.5 mL of the nerve block solution was injected without difficulty or complication. The needle was removed intact. L4 Medial Branch Nerve Block (MBB): The target area for the L4 medial branch is at the junction of the postero-lateral aspect of the superior articular process and the superior, posterior, and medial edge of the transverse process of L5. Under fluoroscopic guidance, a Quincke needle was inserted until contact  was made with os over the superior postero-lateral aspect of the pedicular shadow (target area). After negative aspiration for blood, 0.5 mL of the nerve block solution was injected without difficulty or complication. The needle was removed intact. L5 Medial Branch Nerve Block (MBB): The target area for the L5 medial branch is at the junction of the postero-lateral aspect of the superior articular process and the superior, posterior, and medial edge of the sacral ala. Under fluoroscopic guidance, a Quincke needle was inserted until contact was made with os over the superior postero-lateral aspect of the pedicular shadow (target area). After negative aspiration for blood, 0.5 mL of the nerve block solution was injected without difficulty or complication. The needle was removed intact. S1 Medial Branch Nerve Block (MBB): The target area for the S1 medial branch is at the posterior and inferior 6 o'clock position of the L5-S1 facet joint. Under fluoroscopic guidance, the Quincke needle inserted for the L5 MBB was redirected until contact was made with os over the inferior and postero aspect of the sacrum, at the 6 o' clock position under the L5-S1 facet joint (Target area). After negative aspiration for blood, 0.5 mL of the nerve block solution was injected without difficulty or complication. The needle was removed intact.  Nerve block solution: 0.2% PF-Ropivacaine + Decadron (10 mg/mL) diluted to a final concentration of 1 mg of Decadron/mL of Ropivacaine The unused portion of the solution was discarded in the proper designated containers. Procedural Needles: 22-gauge, 3.5-inch, Quincke needles used for all levels.  Once the entire procedure was completed, the treated area was cleaned, making sure to leave some of the prepping solution back to take advantage of its long term bactericidal properties.      Illustration of the posterior view of the lumbar spine and the posterior neural structures. Laminae of  L2 through S1 are labeled. DPRL5, dorsal primary ramus of L5; DPRS1, dorsal primary ramus of S1; DPR3, dorsal primary ramus of L3; FJ, facet (zygapophyseal) joint L3-L4; I, inferior articular process of L4; LB1, lateral branch of dorsal primary ramus of L1; IAB, inferior  articular branches from L3 medial branch (supplies L4-L5 facet joint); IBP, intermediate branch plexus; MB3, medial branch of dorsal primary ramus of L3; NR3, third lumbar nerve root; S, superior articular process of L5; SAB, superior articular branches from L4 (supplies L4-5 facet joint also); TP3, transverse process of L3.  Vitals:   09/17/21 0936 09/17/21 0942 09/17/21 0945 09/17/21 0954  BP: (!) 136/112 (!) 142/92 (!) 141/101 (!) 141/100  Pulse:      Resp: 14 15 14 14   Temp:      TempSrc:      SpO2: 100% 97% 99% 100%  Weight:      Height:         Start Time: 0940 hrs. End Time: 0945 hrs.  Imaging Guidance (Spinal):          Type of Imaging Technique: Fluoroscopy Guidance (Spinal) Indication(s): Assistance in needle guidance and placement for procedures requiring needle placement in or near specific anatomical locations not easily accessible without such assistance. Exposure Time: Please see nurses notes. Contrast: None used. Fluoroscopic Guidance: I was personally present during the use of fluoroscopy. "Tunnel Vision Technique" used to obtain the best possible view of the target area. Parallax error corrected before commencing the procedure. "Direction-depth-direction" technique used to introduce the needle under continuous pulsed fluoroscopy. Once target was reached, antero-posterior, oblique, and lateral fluoroscopic projection used confirm needle placement in all planes. Images permanently stored in EMR. Interpretation: No contrast injected. I personally interpreted the imaging intraoperatively. Adequate needle placement confirmed in multiple planes. Permanent images saved into the patient's record.  Antibiotic  Prophylaxis:   Anti-infectives (From admission, onward)    None      Indication(s): None identified  Post-operative Assessment:  Post-procedure Vital Signs:  Pulse/HCG Rate: (!) 10489 Temp: (!) 97.1 F (36.2 C) Resp: 14 BP: (!) 141/100 SpO2: 100 %  EBL: None  Complications: No immediate post-treatment complications observed by team, or reported by patient.  Note: The patient tolerated the entire procedure well. A repeat set of vitals were taken after the procedure and the patient was kept under observation following institutional policy, for this type of procedure. Post-procedural neurological assessment was performed, showing return to baseline, prior to discharge. The patient was provided with post-procedure discharge instructions, including a section on how to identify potential problems. Should any problems arise concerning this procedure, the patient was given instructions to immediately contact us, at any time, without hesitation. In any case, we plan to contact the patient by telephone for a follow-up status report regarding this interventional procedure.  Comments:  No additional relevant information.  Plan of Care  Orders:  Orders Placed This Encounter  Procedures   LUMBAR FACET(MEDIAL BRANCH NERVE BLOCK) MBNB    Scheduling Instructions:     Procedure: Lumbar facet block (AKA.: Lumbosacral medial branch nerve block)     Side: Right-sided     Level: L3-4, L4-5, & L5-S1 Facets (L2, L3, L4, L5, & S1 Medial Branch Nerves)     Sedation: Patient's choice.     Timeframe: Today    Order Specific Question:   Where will this procedure be performed?    Answer:   [facility]   DG [facility] C-ARM 1-60 MIN NO REPORT    Intraoperative interpretation by procedural physician at [facility].    Standing Status:   Standing    Number of Occurrences:   1    Order Specific Question:   Reason for exam:    Answer:  Assistance in needle guidance and placement for  procedures requiring needle placement in or near specific anatomical locations not easily accessible without such assistance.   Informed Consent Details: Physician/Practitioner Attestation; Transcribe to consent form and obtain patient signature    Nursing Order: Transcribe to consent form and obtain patient signature. Note: Always confirm laterality of pain with Ms. Hardin Negus, before procedure.    Order Specific Question:   Physician/Practitioner attestation of informed consent for procedure/surgical case    Answer:   I, the physician/practitioner, attest that I have discussed with the patient the benefits, risks, side effects, alternatives, likelihood of achieving goals and potential problems during recovery for the procedure that I have provided informed consent.    Order Specific Question:   Procedure    Answer:   Lumbar Facet Block  under fluoroscopic guidance    Order Specific Question:   Physician/Practitioner performing the procedure    Answer:   Nikya Busler A. Dossie Arbour MD    Order Specific Question:   Indication/Reason    Answer:   Low Back Pain, with our without leg pain, due to Facet Joint Arthralgia (Joint Pain) Spondylosis (Arthritis of the Spine), without myelopathy or radiculopathy (Nerve Damage).   Provide equipment / supplies at bedside    "Block Tray" (Disposable  single use) Needle type: SpinalSpinal Amount/quantity: 4 Size: Regular (3.5-inch) Gauge: 22G    Standing Status:   Standing    Number of Occurrences:   1    Order Specific Question:   Specify    Answer:   Block Tray    Chronic Opioid Analgesic:  Hydrocodone/APAP 5/325, 1 tab p.o. 4 times daily (#56) (filled 02/27/2021) MME/day: 20 mg/day   Medications ordered for procedure: Meds ordered this encounter  Medications   lidocaine (XYLOCAINE) 2 % (with pres) injection 400 mg   lactated ringers infusion 1,000 mL   midazolam (VERSED) 5 MG/5ML injection 0.5-2 mg    Make sure Flumazenil is available in the pyxis when  using this medication. If oversedation occurs, administer 0.2 mg IV over 15 sec. If after 45 sec no response, administer 0.2 mg again over 1 min; may repeat at 1 min intervals; not to exceed 4 doses (1 mg)   ropivacaine (PF) 2 mg/mL (0.2%) (NAROPIN) injection 9 mL   dexamethasone (DECADRON) injection 10 mg    Medications administered: We administered lidocaine, midazolam, ropivacaine (PF) 2 mg/mL (0.2%), and dexamethasone.  See the medical record for exact dosing, route, and time of administration.  Follow-up plan:   Return in about 2 weeks (around 10/01/2021) for Proc-day (T,Th), (VV), (PPE).     Interventional Therapies  Risk  Complexity Considerations:   Estimated body mass index is 29.63 kg/m as calculated from the following:   Height as of this encounter: 5' 2"  (1.575 m).   Weight as of this encounter: 162 lb (73.5 kg). WNL   Planned  Pending:   Diagnostic right caudal ESI #1 + diagnostic epidurogram    Under consideration:   Possible candidate for right-sided Racz procedure  Diagnostic right lumbar facet block  Possible candidate for right thoracolumbar spinal cord stimulator trial/implant    Completed:   Diagnostic right SI joint block x1 (04/16/2021)    Completed by Dr. Sharlet Salina: Right L5-S1 TFESI x1 (12/07/2019)  Right S1 tTFESI x1 (12/07/2019)    Therapeutic  Palliative (PRN) options:   None established       Recent Visits Date Type Provider Dept  07/18/21 Telemedicine Milinda Pointer, [facility]  Showing recent visits within past 90 days and meeting all other requirements Today's Visits Date Type Provider Dept  09/17/21 Procedure visit Milinda Pointer, MD [facility]  Showing today's visits and meeting all other requirements Future Appointments Date Type Provider Dept  10/02/21 Appointment Milinda Pointer, MD [facility]  Showing future appointments within next 90 days and meeting all other  requirements Disposition: Discharge home  Discharge (Date  Time): 09/17/2021; 1000 hrs.   Primary Care Physician: Adin Hector, MD Location: [facility] Note by: Gaspar Cola, MD Date: 09/17/2021; Time: 10:07 AM  Disclaimer:  Medicine is not an Chief Strategy Officer. The only guarantee in medicine is that nothing is guaranteed. It is important to note that the decision to proceed with this intervention was based on the information collected from the patient. The Data and conclusions were drawn from the patient's questionnaire, the interview, and the physical examination. Because the information was provided in large part by the patient, it cannot be guaranteed that it has not been purposely or unconsciously manipulated. Every effort has been made to obtain as much relevant data as possible for this evaluation. It is important to note that the conclusions that lead to this procedure are derived in large part from the available data. Always take into account that the treatment will also be dependent on availability of resources and existing treatment guidelines, considered by other Pain Management Practitioners as being common knowledge and practice, at the time of the intervention. For Medico-Legal purposes, it is also important to point out that variation in procedural techniques and pharmacological choices are the acceptable norm. The indications, contraindications, technique, and results of the above procedure should only be interpreted and judged by a Board-Certified Interventional Pain Specialist with extensive familiarity and expertise in the same exact procedure and technique.

## 2021-09-17 ENCOUNTER — Ambulatory Visit (HOSPITAL_BASED_OUTPATIENT_CLINIC_OR_DEPARTMENT_OTHER): Payer: 59 | Admitting: Pain Medicine

## 2021-09-17 ENCOUNTER — Ambulatory Visit
Admission: RE | Admit: 2021-09-17 | Discharge: 2021-09-17 | Disposition: A | Discharge status: Home / Self Care | Payer: 59 | Source: Ambulatory Visit | Attending: Pain Medicine | Admitting: Pain Medicine

## 2021-09-17 ENCOUNTER — Encounter: Payer: Self-pay | Admitting: Pain Medicine

## 2021-09-17 ENCOUNTER — Other Ambulatory Visit: Payer: Self-pay

## 2021-09-17 VITALS — BP 141/100 | HR 104 | Temp 97.1°F | Resp 14 | Ht 62.0 in | Wt 163.0 lb

## 2021-09-17 DIAGNOSIS — M25551 Pain in right hip: Secondary | ICD-10-CM

## 2021-09-17 DIAGNOSIS — M47816 Spondylosis without myelopathy or radiculopathy, lumbar region: Secondary | ICD-10-CM | POA: Insufficient documentation

## 2021-09-17 DIAGNOSIS — M47817 Spondylosis without myelopathy or radiculopathy, lumbosacral region: Secondary | ICD-10-CM | POA: Diagnosis not present

## 2021-09-17 DIAGNOSIS — G8929 Other chronic pain: Secondary | ICD-10-CM | POA: Diagnosis not present

## 2021-09-17 DIAGNOSIS — M545 Low back pain, unspecified: Secondary | ICD-10-CM | POA: Diagnosis not present

## 2021-09-17 MED ORDER — LIDOCAINE HCL 2 % IJ SOLN
20.0000 mL | Freq: Once | INTRAMUSCULAR | Status: AC
  Administered 2021-09-17: 200 mg

## 2021-09-17 MED ORDER — ROPIVACAINE HCL 2 MG/ML IJ SOLN
INTRAMUSCULAR | Status: AC
  Filled 2021-09-17: qty 20

## 2021-09-17 MED ORDER — ROPIVACAINE HCL 2 MG/ML IJ SOLN
9.0000 mL | Freq: Once | INTRAMUSCULAR | Status: AC
  Administered 2021-09-17: 9 mL via PERINEURAL

## 2021-09-17 MED ORDER — MIDAZOLAM HCL 5 MG/5ML IJ SOLN
INTRAMUSCULAR | Status: AC
  Filled 2021-09-17: qty 5

## 2021-09-17 MED ORDER — DEXAMETHASONE SODIUM PHOSPHATE 10 MG/ML IJ SOLN
10.0000 mg | Freq: Once | INTRAMUSCULAR | Status: AC
  Administered 2021-09-17: 10 mg

## 2021-09-17 MED ORDER — DEXAMETHASONE SODIUM PHOSPHATE 10 MG/ML IJ SOLN
INTRAMUSCULAR | Status: AC
  Filled 2021-09-17: qty 1

## 2021-09-17 MED ORDER — MIDAZOLAM HCL 5 MG/5ML IJ SOLN
0.5000 mg | Freq: Once | INTRAMUSCULAR | Status: AC
  Administered 2021-09-17: 2 mg via INTRAVENOUS

## 2021-09-17 MED ORDER — LIDOCAINE HCL 2 % IJ SOLN
INTRAMUSCULAR | Status: AC
  Filled 2021-09-17: qty 20

## 2021-09-17 MED ORDER — LACTATED RINGERS IV SOLN: 1000.0000 mL | Freq: Once | INTRAVENOUS | Status: DC

## 2021-09-17 NOTE — Patient Instructions (Addendum)
____________________________________________________________________________________________  Post-Procedure Discharge Instructions  Instructions: Apply ice:  Purpose: This will minimize any swelling and discomfort after procedure.  When: Day of procedure, as soon as you get home. How: Fill a plastic sandwich bag with crushed ice. Cover it with a small towel and apply to injection site. How long: (15 min on, 15 min off) Apply for 15 minutes then remove x 15 minutes.  Repeat sequence on day of procedure, until you go to bed. Apply heat:  Purpose: To treat any soreness and discomfort from the procedure. When: Starting the next day after the procedure. How: Apply heat to procedure site starting the day following the procedure. How long: May continue to repeat daily, until discomfort goes away. Food intake: Start with clear liquids (like water) and advance to regular food, as tolerated.  Physical activities: Keep activities to a minimum for the first 8 hours after the procedure. After that, then as tolerated. Driving: If you have received any sedation, be responsible and do not drive. You are not allowed to drive for 24 hours after having sedation. Blood thinner: (Applies only to those taking blood thinners) You may restart your blood thinner 6 hours after your procedure. Insulin: (Applies only to Diabetic patients taking insulin) As soon as you can eat, you may resume your normal dosing schedule. Infection prevention: Keep procedure site clean and dry. Shower daily and clean area with soap and water. Post-procedure Pain Diary: Extremely important that this be done correctly and accurately. Recorded information will be used to determine the next step in treatment. For the purpose of accuracy, follow these rules: Evaluate only the area treated. Do not report or include pain from an untreated area. For the purpose of this evaluation, ignore all other areas of pain, except for the treated area. After  your procedure, avoid taking a long nap and attempting to complete the pain diary after you wake up. Instead, set your alarm clock to go off every hour, on the hour, for the initial 8 hours after the procedure. Document the duration of the numbing medicine, and the relief you are getting from it. Do not go to sleep and attempt to complete it later. It will not be accurate. If you received sedation, it is likely that you were given a medication that may cause amnesia. Because of this, completing the diary at a later time may cause the information to be inaccurate. This information is needed to plan your care. Follow-up appointment: Keep your post-procedure follow-up evaluation appointment after the procedure (usually 2 weeks for most procedures, 6 weeks for radiofrequencies). DO NOT FORGET to bring you pain diary with you.   Expect: (What should I expect to see with my procedure?) From numbing medicine (AKA: Local Anesthetics): Numbness or decrease in pain. You may also experience some weakness, which if present, could last for the duration of the local anesthetic. Onset: Full effect within 15 minutes of injected. Duration: It will depend on the type of local anesthetic used. On the average, 1 to 8 hours.  From steroids (Applies only if steroids were used): Decrease in swelling or inflammation. Once inflammation is improved, relief of the pain will follow. Onset of benefits: Depends on the amount of swelling present. The more swelling, the longer it will take for the benefits to be seen. In some cases, up to 10 days. Duration: Steroids will stay in the system x 2 weeks. Duration of benefits will depend on multiple posibilities including persistent irritating factors. Side-effects: If present, they  may typically last 2 weeks (the duration of the steroids). Frequent: Cramps (if they occur, drink Gatorade and take over-the-counter Magnesium 450-500 mg once to twice a day); water retention with temporary  weight gain; increases in blood sugar; decreased immune system response; increased appetite. Occasional: Facial flushing (red, warm cheeks); mood swings; menstrual changes. Uncommon: Long-term decrease or suppression of natural hormones; bone thinning. (These are more common with higher doses or more frequent use. This is why we prefer that our patients avoid having any injection therapies in other practices.)  Very Rare: Severe mood changes; psychosis; aseptic necrosis. From procedure: Some discomfort is to be expected once the numbing medicine wears off. This should be minimal if ice and heat are applied as instructed.  Call if: (When should I call?) You experience numbness and weakness that gets worse with time, as opposed to wearing off. New onset bowel or bladder incontinence. (Applies only to procedures done in the spine)  Emergency Numbers: Durning business hours (Monday - Thursday, 8:00 AM - 4:00 PM) (Friday, 9:00 AM - 12:00 Noon): [phone] After hours: [phone] NOTE: If you are having a problem and are unable connect with, or to talk to a provider, then go to your nearest urgent care or emergency department. If the problem is serious and urgent, please call 911. ____________________________________________________________________________________________ ____________________________________________________________________________________________  Virtual Visits   What is a "Virtual Visit"? It is a Metallurgist (medical visit) that takes place on real time (NOT TEXT or E-MAIL) over the telephone or computer device (desktop, laptop, tablet, smart phone, etc.). It allows for more location flexibility between the patient and the healthcare provider.  Who decides when these types of visits will be used? The physician.  Who is eligible for these types of visits? Only those patients that can be reliably reached over the telephone.  What do you mean by  reliably? We do not have time to call everyone multiple times, therefore those that tend to screen calls and then call back later are not suitable candidates for this system. We understand how people are reluctant to pickup on "unknown" calls, therefore, we suggest adding our telephone numbers to your list of "CONTACT(s)". This way, you should be able to readily identify our calls when you receive one. All of our numbers are available below.   Who is not eligible? This option is not available for medication management encounters, specially for controlled substances. Patients on pain medications that fall under the category of controlled substances have to come in for "Face-to-Face" encounters. This is required for mandatory monitoring of these substances. You may be asked to provide a sample for an unannounced urine drug screening test (UDS), and we will need to count your pain pills. Not bringing your pills to be counted may result in no refill. Obviously, neither one of these can be done over the phone.  When will this type of visits be used? You can request a virtual visit whenever you are physically unable to attend a regular appointment. The decision will be made by the physician (or healthcare provider) on a case by case basis.   At what time will I be called? This is an excellent question. The providers will try to call you whenever they have time available. Do not expect to be called at any specific time. The secretaries will assign you a time for your virtual visit appointment, but this is done simply to keep a list of those patients that need to be called, but  not for the purpose of keeping a time schedule. Be advised that the call may come in anytime during the day, between the hours of 8:00 AM and 8::00 PM, depending on provider availability. We do understand that the system is not perfect. If you are unable to be available that day on a moments notice, then request an "in-person" appointment  rather than a "virtual visit".  Can I request my medication visits to be "Virtual"? Yes you may request it, but the decision is entirely up to the healthcare provider. Control substances require specific monitoring that requires Face-to-Face encounters. The number of encounters  and the extent of the monitoring is determined on a case by case basis.  Add a new contact to your smart phone and label it "PAIN CLINIC" Under this contact add the following numbers: Main: [phone] (Official Contact Number) Nurses: [phone] (These are outgoing only calling systems. Do not call this number.) Dr. Dossie Arbour: [phone] or [phone] (Outgoing calls only. Do not call this number.)  ____________________________________________________________________________________________

## 2021-09-18 ENCOUNTER — Telehealth: Payer: Self-pay | Admitting: *Deleted

## 2021-09-18 ENCOUNTER — Other Ambulatory Visit: Payer: Self-pay

## 2021-09-18 NOTE — Telephone Encounter (Signed)
No problems post procedure. 

## 2021-10-01 ENCOUNTER — Telehealth: Payer: 59 | Admitting: Pain Medicine

## 2021-10-02 ENCOUNTER — Encounter: Payer: 59 | Admitting: Pain Medicine

## 2021-10-03 ENCOUNTER — Other Ambulatory Visit: Payer: Self-pay

## 2021-10-03 DIAGNOSIS — E663 Overweight: Secondary | ICD-10-CM | POA: Diagnosis not present

## 2021-10-08 NOTE — Progress Notes (Signed)
PROVIDER NOTE: Information contained herein reflects review and annotations entered in association with encounter. Interpretation of such information and data should be left to medically-trained personnel. Information provided to patient can be located elsewhere in the medical record under "Patient Instructions". Document created using STT-dictation technology, any transcriptional errors that may result from process are unintentional.    Patient: Tammy Deleon  Service Category: E/M  Provider: Gaspar Cola, MD  DOB: Mar 25, 1986  DOS: 10/09/2021  Specialty: Interventional Pain Management  MRN: [id number]  Setting: Ambulatory outpatient  PCP: Adin Hector, MD  Type: Established Patient    Referring Provider: Adin Hector, MD  Location: Office  Delivery: Face-to-face     HPI  [name], a 35 y.o. year old female, is here today because of her Chronic right-sided low back pain without sciatica [M54.50, G89.29]. Tammy Deleon primary complain today is No chief complaint on file. Last encounter: My last encounter with her was on 09/17/2021. Pertinent problems: Tammy Deleon has Headache disorder; Lumbar disc disease; Chronic pain syndrome; Failed back surgical syndrome; Chronic low back pain (1ry area of Pain) (Right) w/o sciatica; Epidural fibrosis (S1) (Right); Lumbar facet joint syndrome (Right); Chronic hip pain (Right); Abnormal MRI, lumbar spine (02/04/2021); Lumbosacral facet arthropathy (L5-S1); Lumbosacral lateral recess stenosis (L5-S1) (Bilateral); History of lumbar laminectomy x2; Chronic sacroiliac joint pain (Right); Somatic dysfunction of sacroiliac joint (Right); Other spondylosis, sacral and sacrococcygeal region; Enthesopathy of sacroiliac joint (Right); Chronic lower extremity pain (intermittent) (2ry area of Pain) (Right); DDD (degenerative disc disease), lumbosacral; and Spondylosis without myelopathy or radiculopathy, lumbosacral region on their pertinent  problem list. Pain Assessment: Severity of Chronic pain is reported as a 4 /10. Location: Back Lower, Right/into right butt cheek. Onset: More than a month ago. Quality: Discomfort, Constant, Sharp, Pressure, Shooting. Timing: Constant. Modifying factor(s): procedure has improved, medications, heating pad. Vitals:  height is _0  (1.575 m) and weight is 159 lb (72.1 kg). Her temporal temperature is 97.1 F (36.2 C) (abnormal). Her blood pressure is 150/100 (abnormal) and her pulse is 106 (abnormal). Her respiration is 16 and oxygen saturation is 100%.   Reason for encounter: both, medication management and post-procedure assessment.  The patient indicates doing well with the current medication regimen. No adverse reactions or side effects reported to the medications.  In addition the patient is back for evaluation of a recent right-sided lumbar facet block.  The patient indicates that for the duration of the local anesthetic it provided her with 80% relief of the pain which then dwindled down to a 60% followed by an ongoing 50% relief of the pain.  When we take into account the results of her recent diagnostic injection, it is evident that we did not cover the entire area where the pain is coming from.  If we did not take into account the prior procedures that we had done, then we see a pattern emerging where she seems to be having some pain coming from the lumbar facet joints as well as the sacroiliac joint.  Physical exam today was again positive for right sacroiliac joint arthralgia/arthropathy upon performing the provocative Patrick maneuver.  At this point, the patient is not having any lower extremity pain and therefore her pain is likely to be coming from the lumbar facet joints as well as the sacroiliac joint, both of them on the right side.  The neck step here will be to combine a lower lumbar facet medial branch block with a  right-sided sacroiliac joint block and see what type of results we get.   If the patient attained 100% relief of the pain for the duration of the local anesthetic, this would confirm that her pain indeed is coming from both areas suggesting that radiofrequency ablation needs to be done on both sides.  The plan was explained to the patient who understood and accepted.  RTCB: 01/07/2022  Post-Procedure Evaluation  Procedure(s):    Procedure:          Type: Lumbar Facet, Medial Branch Block(s)  #1        Primary Purpose: Diagnostic/Therapeutic Region: Posterolateral Lumbosacral Spine Level: L3, L4, L5, & S1 Medial Branch Level(s). Laterality: Right Anesthesia: Local (1-2% Lidocaine)  Anxiolysis: None  Sedation: None  Guidance: Fluoroscopy          Indications: 1. Lumbar facet joint syndrome (Right)   2. Lumbosacral facet arthropathy (L5-S1)   3. Spondylosis without myelopathy or radiculopathy, lumbosacral region   4. Chronic low back pain (1ry area of Pain) (Right) w/o sciatica   5. Chronic hip pain (Right)    Pain Score: Pre-procedure: 7 /10 Post-procedure: 2 /10     Anxiolysis: Please see nurses note.  Effectiveness during initial hour after procedure (Ultra-Short Term Relief): 80 %.  Local anesthetic used: Long-acting (4-6 hours) Effectiveness: Defined as any analgesic benefit obtained secondary to the administration of local anesthetics. This carries significant diagnostic value as to the etiological location, or anatomical origin, of the pain. Duration of benefit is expected to coincide with the duration of the local anesthetic used.  Effectiveness during initial 4-6 hours after procedure (Short-Term Relief): 60 %.  Long-term benefit: Defined as any relief past the pharmacologic duration of the local anesthetics.  Effectiveness past the initial 6 hours after procedure (Long-Term Relief): 50 % (pain is improved but there is still some pressure on the right lumbar and sharp pain shooting through it.).  Benefits, current: Defined as benefit present at the  time of this evaluation.   Analgesia: The patient refers having attained an ongoing 50% relief of her right-sided low back pain.  However, she also pointed out that when the area was numb, she was not getting 100% relief of the pain. Function: Somewhat improved ROM: Somewhat improved  Pharmacotherapy Assessment  Analgesic: Hydrocodone/APAP 5/325, 1 tab p.o. 4 times daily (#56) (filled 02/27/2021) MME/day: 20 mg/day   Monitoring: [place] PMP: PDMP reviewed during this encounter.       Pharmacotherapy: No side-effects or adverse reactions reported. Compliance: No problems identified. Effectiveness: Clinically acceptable.  Janett Billow, RN  10/09/2021  2:06 PM  Sign when Signing Visit Nursing Pain Medication Assessment:  Safety precautions to be maintained throughout the outpatient stay will include: orient to surroundings, keep bed in low position, maintain call bell within reach at all times, provide assistance with transfer out of bed and ambulation.  Medication Inspection Compliance: Pill count conducted under aseptic conditions, in front of the patient. Neither the pills nor the bottle was removed from the patient's sight at any time. Once count was completed pills were immediately returned to the patient in their original bottle.  Medication: Hydrocodone/APAP Pill/Patch Count:  4 of 100 pills remain Pill/Patch Appearance: Markings consistent with prescribed medication Bottle Appearance: Standard pharmacy container. Clearly labeled. Filled Date: 54 / 9 / 2022 Last Medication intake:  Today    UDS:  Summary  Date Value Ref Range Status  03/13/2021 Note  Final    Comment:    ====================================================================  Compliance Drug Analysis, Ur ==================================================================== Test                             Result       Flag       Units  Drug Present and Declared for Prescription Verification   Hydrocodone                     598          EXPECTED   ng/mg creat   Hydromorphone                  38           EXPECTED   ng/mg creat   Dihydrocodeine                 139          EXPECTED   ng/mg creat   Norhydrocodone                 504          EXPECTED   ng/mg creat    Sources of hydrocodone include scheduled prescription medications.    Hydromorphone, dihydrocodeine and norhydrocodone are expected    metabolites of hydrocodone. Hydromorphone and dihydrocodeine are    also available as scheduled prescription medications.    Lamotrigine                    PRESENT      EXPECTED   Trazodone                      PRESENT      EXPECTED   1,3 chlorophenyl piperazine    PRESENT      EXPECTED    1,3-chlorophenyl piperazine is an expected metabolite of trazodone.    Acetaminophen                  PRESENT      EXPECTED   Ibuprofen                      PRESENT      EXPECTED  Drug Present not Declared for Prescription Verification   Tramadol                       >3759        UNEXPECTED ng/mg creat   O-Desmethyltramadol            >3759        UNEXPECTED ng/mg creat   N-Desmethyltramadol            1017         UNEXPECTED ng/mg creat    Source of tramadol is a prescription medication. O-desmethyltramadol    and N-desmethyltramadol are expected metabolites of tramadol.    Naproxen                       PRESENT      UNEXPECTED  Drug Absent but Declared for Prescription Verification   Amphetamine                    Not Detected UNEXPECTED ng/mg creat   Phentermine                    Not Detected UNEXPECTED ==================================================================== Test  Result    Flag   Units      Ref Range   Creatinine              133              mg/dL      >=20 ==================================================================== Declared Medications:  The flagging and interpretation on this report are based on the  following declared medications.  Unexpected results may arise  from  inaccuracies in the declared medications.   **Note: The testing scope of this panel includes these medications:   Amphetamine (Adderall)  Hydrocodone (Norco)  Lamotrigine (Lamictal)  Phentermine (Adipex)  Trazodone (Desyrel)   **Note: The testing scope of this panel does not include small to  moderate amounts of these reported medications:   Acetaminophen (Norco)  Ibuprofen (Advil)   **Note: The testing scope of this panel does not include the  following reported medications:   Ethinyl Estradiol  Levonorgestrel  Valacyclovir (Valtrex) ==================================================================== For clinical consultation, please call [phone]. ====================================================================      ROS  Constitutional: Denies any fever or chills Gastrointestinal: No reported hemesis, hematochezia, vomiting, or acute GI distress Musculoskeletal: Denies any acute onset joint swelling, redness, loss of ROM, or weakness Neurological: No reported episodes of acute onset apraxia, aphasia, dysarthria, agnosia, amnesia, paralysis, loss of coordination, or loss of consciousness  Medication Review  HYDROcodone-acetaminophen, Levonorgestrel-Ethinyl Estradiol, amphetamine-dextroamphetamine, diazepam, ibuprofen, lamoTRIgine, phentermine, traZODone, and valACYclovir  History Review  Allergy: Tammy Deleon is allergic to sulfa antibiotics. Drug: Tammy Deleon  reports no history of drug use. Alcohol:  reports current alcohol use. Tobacco:  reports that she has never smoked. She has never used smokeless tobacco. Social: Tammy Deleon  reports that she has never smoked. She has never used smokeless tobacco. She reports current alcohol use. She reports that she does not use drugs. Medical:  has a past medical history of Anxiety, Depression, Irregular intermenstrual bleeding, and UTI (lower urinary tract infection). Surgical: Tammy Deleon  has a past  surgical history that includes Gallbladder surgery; Hemi-microdiscectomy lumbar laminectomy level 1 (Right, 01/02/2020); and Hemi-microdiscectomy lumbar laminectomy level 1 (Right, 11/05/2020). Family: family history is not on file.  Laboratory Chemistry Profile   Renal Lab Results  Component Value Date   BUN 15 03/13/2021   CREATININE 0.89 03/13/2021   LABCREA 208 09/21/2019   BCR 17 03/13/2021   GFRAA >60 12/30/2019   GFRNONAA >60 10/29/2020    Hepatic Lab Results  Component Value Date   AST 18 03/13/2021   ALT 11 09/22/2019   ALBUMIN 4.4 03/13/2021   ALKPHOS 67 03/13/2021   LIPASE 80 03/17/2012    Electrolytes Lab Results  Component Value Date   NA 139 03/13/2021   K 4.7 03/13/2021   CL 103 03/13/2021   CALCIUM 9.9 03/13/2021   MG 1.9 03/13/2021    Bone Lab Results  Component Value Date   25OHVITD1 50 03/13/2021   25OHVITD2 <1.0 03/13/2021   25OHVITD3 50 03/13/2021    Inflammation (CRP: Acute Phase) (ESR: Chronic Phase) Lab Results  Component Value Date   CRP 9 03/13/2021   ESRSEDRATE 14 03/13/2021         Note: Above Lab results reviewed.  Recent Imaging Review  DG PAIN CLINIC C-ARM 1-60 MIN NO REPORT Fluoro was used, but no Radiologist interpretation will be provided.  Please refer to "NOTES" tab for provider progress note. Note: Reviewed        Physical Exam  General appearance: Well nourished, well developed,  and well hydrated. In no apparent acute distress Mental status: Alert, oriented x 3 (person, place, & time)       Respiratory: No evidence of acute respiratory distress Eyes: PERLA Vitals: BP (!) 150/100 Comment: patient does not take BP medicine.  states she is seeing PCP next week.  reports that her BP is up one day and down the next.  Pulse (!) 106   Temp (!) 97.1 F (36.2 C) (Temporal)   Resp 16   Ht _0  (1.575 m)   Wt 159 lb (72.1 kg)   SpO2 100%   BMI 29.08 kg/m  BMI: Estimated body mass index is 29.08 kg/m as calculated from the  following:   Height as of this encounter: _1  (1.575 m).   Weight as of this encounter: 159 lb (72.1 kg). Ideal: Ideal body weight: 50.1 kg (110 lb 7.2 oz) Adjusted ideal body weight: 58.9 kg (129 lb 13.9 oz)  Assessment   Status Diagnosis  Improving Improving Persistent 1. Chronic low back pain (1ry area of Pain) (Right) w/o sciatica   2. Lumbar facet joint syndrome (Right)   3. Chronic sacroiliac joint pain (Right)   4. Chronic pain syndrome   5. Pharmacologic therapy   6. Chronic use of opiate for therapeutic purpose   7. Encounter for chronic pain management      Updated Problems: No problems updated.  Plan of Care  Problem-specific:  No problem-specific Assessment & Plan notes found for this encounter.  Tammy Deleon has a current medication list which includes the following long-term medication(s): amphetamine-dextroamphetamine, lamotrigine, levonorgestrel-ethinyl estradiol, phentermine, trazodone, hydrocodone-acetaminophen, [START ON 11/08/2021] hydrocodone-acetaminophen, [START ON 12/08/2021] hydrocodone-acetaminophen, lamotrigine, levonorgestrel-ethinyl estradiol, phentermine, and trazodone.  Pharmacotherapy (Medications Ordered): Meds ordered this encounter  Medications   HYDROcodone-acetaminophen (NORCO/VICODIN) 5-325 MG tablet    Sig: Take 1 tablet by mouth every 6 (six) hours as needed for severe pain. Must last 30 days.    Dispense:  100 tablet    Refill:  0    DO NOT: delete (not duplicate); no partial-fill (will deny script to complete), no refill request (F/U required). DISPENSE: 1 day early if closed on fill date. WARN: No CNS-depressants within 8 hrs of med.   HYDROcodone-acetaminophen (NORCO/VICODIN) 5-325 MG tablet    Sig: Take 1 tablet by mouth every 6 (six) hours as needed for severe pain. Must last 30 days.    Dispense:  100 tablet    Refill:  0    DO NOT: delete (not duplicate); no partial-fill (will deny script to complete), no refill request  (F/U required). DISPENSE: 1 day early if closed on fill date. WARN: No CNS-depressants within 8 hrs of med.   HYDROcodone-acetaminophen (NORCO/VICODIN) 5-325 MG tablet    Sig: Take 1 tablet by mouth every 6 (six) hours as needed for severe pain. Must last 30 days.    Dispense:  100 tablet    Refill:  0    DO NOT: delete (not duplicate); no partial-fill (will deny script to complete), no refill request (F/U required). DISPENSE: 1 day early if closed on fill date. WARN: No CNS-depressants within 8 hrs of med.    Orders:  Orders Placed This Encounter  Procedures   LUMBAR FACET(MEDIAL BRANCH NERVE BLOCK) MBNB    Standing Status:   Future    Standing Expiration Date:   01/07/2022    Scheduling Instructions:     Procedure: Lumbar facet block (AKA.: Lumbosacral medial branch nerve block)  Side: Right-sided     Level: L3-4, L4-5, & L5-S1 Facets (L2, L3, L4, L5, & S1 Medial Branch Nerves)     Sedation: Patient's choice.     Timeframe: ASAP    Order Specific Question:   Where will this procedure be performed?    Answer:   [facility]   SACROILIAC JOINT INJECTION    Standing Status:   Future    Standing Expiration Date:   01/07/2022    Scheduling Instructions:     Side: Right-sided     Sedation: Patient's choice.     Timeframe: ASAP    Order Specific Question:   Where will this procedure be performed?    Answer:   [facility]    Follow-up plan:   Return for (Clinic) procedure: (R) L-FCT BLK + (R) SI BLK #2, (Sed-anx).     Interventional Therapies  Risk  Complexity Considerations:   Estimated body mass index is 29.08 kg/m as calculated from the following:   Height as of this encounter: _0  (1.575 m).   Weight as of this encounter: 159 lb (72.1 kg). WNL   Planned  Pending:   Diagnostic right lumbar facet MBB #2 + right SI joint block #2    Under consideration:   Possible right lumbar facet RFA  Possible right SI joint RFA  Possible candidate for right  thoracolumbar spinal cord stimulator trial/implant    Completed by Dr. Sharlet Salina: Right L5-S1 TFESI x1 (12/07/2019)  Right S1 tTFESI x1 (12/07/2019)   Completed:   Diagnostic right SI joint block x1 (04/16/2021) (100/100/50/0)  Diagnostic right caudal ESI x1 + diagnostic epidurogram (05/16/2021) (100/100/0/0)  Therapeutic right Racz procedure x1 (06/13/2021) (75/75/70 5/100% for the lower extremity pain but not the back) Diagnostic right lumbar facet MBB x1 (09/17/2021) (80/60/50/50)    Therapeutic  Palliative (PRN) options:   None established    Recent Visits Date Type Provider Dept  09/17/21 Procedure visit Milinda Pointer, MD [facility]  07/18/21 Telemedicine Milinda Pointer, MD [facility]  Showing recent visits within past 90 days and meeting all other requirements Today's Visits Date Type Provider Dept  10/09/21 Office Visit Milinda Pointer, MD [facility]  Showing today's visits and meeting all other requirements Future Appointments Date Type Provider Dept  11/19/21 Appointment Milinda Pointer, MD [facility]  12/30/21 Appointment Milinda Pointer, MD [facility]  Showing future appointments within next 90 days and meeting all other requirements I discussed the assessment and treatment plan with the patient. The patient was provided an opportunity to ask questions and all were answered. The patient agreed with the plan and demonstrated an understanding of the instructions.  Patient advised to call back or seek an in-person evaluation if the symptoms or condition worsens.  Duration of encounter: 30 minutes.  Note by: Gaspar Cola, MD Date: 10/09/2021; Time: 2:45 PM

## 2021-10-09 ENCOUNTER — Other Ambulatory Visit: Payer: Self-pay

## 2021-10-09 ENCOUNTER — Ambulatory Visit: Payer: 59 | Attending: Pain Medicine | Admitting: Pain Medicine

## 2021-10-09 ENCOUNTER — Encounter: Payer: Self-pay | Admitting: Pain Medicine

## 2021-10-09 VITALS — BP 150/100 | HR 106 | Temp 97.1°F | Resp 16 | Ht 62.0 in | Wt 159.0 lb

## 2021-10-09 DIAGNOSIS — G8929 Other chronic pain: Secondary | ICD-10-CM

## 2021-10-09 DIAGNOSIS — M47816 Spondylosis without myelopathy or radiculopathy, lumbar region: Secondary | ICD-10-CM

## 2021-10-09 DIAGNOSIS — Z79899 Other long term (current) drug therapy: Secondary | ICD-10-CM

## 2021-10-09 DIAGNOSIS — G894 Chronic pain syndrome: Secondary | ICD-10-CM

## 2021-10-09 DIAGNOSIS — M533 Sacrococcygeal disorders, not elsewhere classified: Secondary | ICD-10-CM | POA: Insufficient documentation

## 2021-10-09 DIAGNOSIS — Z79891 Long term (current) use of opiate analgesic: Secondary | ICD-10-CM

## 2021-10-09 DIAGNOSIS — M545 Low back pain, unspecified: Secondary | ICD-10-CM

## 2021-10-09 MED ORDER — HYDROCODONE-ACETAMINOPHEN 5-325 MG PO TABS
1.0000 | ORAL_TABLET | Freq: Four times a day (QID) | ORAL | 0 refills | Status: DC | PRN
  Filled 2021-10-09: qty 100, 25d supply, fill #0
  Filled 2021-10-11 (×2): qty 100, 30d supply, fill #0

## 2021-10-09 MED ORDER — HYDROCODONE-ACETAMINOPHEN 5-325 MG PO TABS
1.0000 | ORAL_TABLET | Freq: Four times a day (QID) | ORAL | 0 refills | Status: DC | PRN
  Filled 2021-11-08 (×2): qty 100, 30d supply, fill #0

## 2021-10-09 MED ORDER — HYDROCODONE-ACETAMINOPHEN 5-325 MG PO TABS
1.0000 | ORAL_TABLET | Freq: Four times a day (QID) | ORAL | 0 refills | Status: DC | PRN
  Filled 2021-12-08 – 2021-12-09 (×2): qty 100, 25d supply, fill #0

## 2021-10-09 NOTE — Progress Notes (Signed)
Nursing Pain Medication Assessment:  Safety precautions to be maintained throughout the outpatient stay will include: orient to surroundings, keep bed in low position, maintain call bell within reach at all times, provide assistance with transfer out of bed and ambulation.  Medication Inspection Compliance: Pill count conducted under aseptic conditions, in front of the patient. Neither the pills nor the bottle was removed from the patient's sight at any time. Once count was completed pills were immediately returned to the patient in their original bottle.  Medication: Hydrocodone/APAP Pill/Patch Count:  4 of 100 pills remain Pill/Patch Appearance: Markings consistent with prescribed medication Bottle Appearance: Standard pharmacy container. Clearly labeled. Filled Date: 60 / 9 / 2022 Last Medication intake:  Today

## 2021-10-09 NOTE — Patient Instructions (Signed)
____________________________________________________________________________________________  General Risks and Possible Complications  Patient Responsibilities: It is important that you read this as it is part of your informed consent. It is our duty to inform you of the risks and possible complications associated with treatments offered to you. It is your responsibility as a patient to read this and to ask questions about anything that is not clear or that you believe was not covered in this document.  Patient's Rights: You have the right to refuse treatment. You also have the right to change your mind, even after initially having agreed to have the treatment done. However, under this last option, if you wait until the last second to change your mind, you may be charged for the materials used up to that point.  Introduction: Medicine is not an Chief Strategy Officer. Everything in Medicine, including the lack of treatment(s), carries the potential for danger, harm, or loss (which is by definition: Risk). In Medicine, a complication is a secondary problem, condition, or disease that can aggravate an already existing one. All treatments carry the risk of possible complications. The fact that a side effects or complications occurs, does not imply that the treatment was conducted incorrectly. It must be clearly understood that these can happen even when everything is done following the highest safety standards.  No treatment: You can choose not to proceed with the proposed treatment alternative. The "PRO(s)" would include: avoiding the risk of complications associated with the therapy. The "CON(s)" would include: not getting any of the treatment benefits. These benefits fall under one of three categories: diagnostic; therapeutic; and/or palliative. Diagnostic benefits include: getting information which can ultimately lead to improvement of the disease or symptom(s). Therapeutic benefits are those associated with the  successful treatment of the disease. Finally, palliative benefits are those related to the decrease of the primary symptoms, without necessarily curing the condition (example: decreasing the pain from a flare-up of a chronic condition, such as incurable terminal cancer).  General Risks and Complications: These are associated to most interventional treatments. They can occur alone, or in combination. They fall under one of the following six (6) categories: no benefit or worsening of symptoms; bleeding; infection; nerve damage; allergic reactions; and/or death. No benefits or worsening of symptoms: In Medicine there are no guarantees, only probabilities. No healthcare provider can ever guarantee that a medical treatment will work, they can only state the probability that it may. Furthermore, there is always the possibility that the condition may worsen, either directly, or indirectly, as a consequence of the treatment. Bleeding: This is more common if the patient is taking a blood thinner, either prescription or over the counter (example: Goody Powders, Fish oil, Aspirin, Garlic, etc.), or if suffering a condition associated with impaired coagulation (example: Hemophilia, cirrhosis of the liver, low platelet counts, etc.). However, even if you do not have one on these, it can still happen. If you have any of these conditions, or take one of these drugs, make sure to notify your treating physician. Infection: This is more common in patients with a compromised immune system, either due to disease (example: diabetes, cancer, human immunodeficiency virus [HIV], etc.), or due to medications or treatments (example: therapies used to treat cancer and rheumatological diseases). However, even if you do not have one on these, it can still happen. If you have any of these conditions, or take one of these drugs, make sure to notify your treating physician. Nerve Damage: This is more common when the treatment is an invasive  one, but it can also happen with the use of medications, such as those used in the treatment of cancer. The damage can occur to small secondary nerves, or to large primary ones, such as those in the spinal cord and brain. This damage may be temporary or permanent and it may lead to impairments that can range from temporary numbness to permanent paralysis and/or brain death. Allergic Reactions: Any time a substance or material comes in contact with our body, there is the possibility of an allergic reaction. These can range from a mild skin rash (contact dermatitis) to a severe systemic reaction (anaphylactic reaction), which can result in death. Death: In general, any medical intervention can result in death, most of the time due to an unforeseen complication. ____________________________________________________________________________________________ ______________________________________________________________________  Preparing for Procedure with Sedation  NOTICE: Due to recent regulatory changes, starting on June 03, 2021, procedures requiring intravenous (IV) sedation will no longer be performed at the [facility].  These types of procedures are required to be performed at [facility] ambulatory surgery facility.  We are very sorry for the inconvenience.  Procedure appointments are limited to planned procedures: No Prescription Refills. No disability issues will be discussed. No medication changes will be discussed.  Instructions: Oral Intake: Do not eat or drink anything for at least 8 hours prior to your procedure. (Exception: Blood Pressure Medication. See below.) Transportation: A driver is required. You may not drive yourself after the procedure. Blood Pressure Medicine: Do not forget to take your blood pressure medicine with a sip of water the morning of the procedure. If your Diastolic (lower reading) is above 100 mmHg, elective cases will be cancelled/rescheduled. Blood thinners:  These will need to be stopped for procedures. Notify our staff if you are taking any blood thinners. Depending on which one you take, there will be specific instructions on how and when to stop it. Diabetics on insulin: Notify the staff so that you can be scheduled 1st case in the morning. If your diabetes requires high dose insulin, take only  of your normal insulin dose the morning of the procedure and notify the staff that you have done so. Preventing infections: Shower with an antibacterial soap the morning of your procedure. Build-up your immune system: Take 1000 mg of Vitamin C with every meal (3 times a day) the day prior to your procedure. Antibiotics: Inform the staff if you have a condition or reason that requires you to take antibiotics before dental procedures. Pregnancy: If you are pregnant, call and cancel the procedure. Sickness: If you have a cold, fever, or any active infections, call and cancel the procedure. Arrival: You must be in the facility at least 30 minutes prior to your scheduled procedure. Children: Do not bring children with you. Dress appropriately: Bring dark clothing that you would not mind if they get stained. Valuables: Do not bring any jewelry or valuables.  Reasons to call and reschedule or cancel your procedure: (Following these recommendations will minimize the risk of a serious complication.) Surgeries: Avoid having procedures within 2 weeks of any surgery. (Avoid for 2 weeks before or after any surgery). Flu Shots: Avoid having procedures within 2 weeks of a flu shots. (Avoid for 2 weeks before or after immunizations). Barium: Avoid having a procedure within 7-10 days after having had a radiological study involving the use of radiological contrast. (Myelograms, Barium swallow or enema study). Heart attacks: Avoid any elective procedures or surgeries for the initial 6 months after a "Myocardial Infarction" (Heart  Attack). Blood thinners: It is imperative that  you stop these medications before procedures. Let us know if you if you take any blood thinner.  Infection: Avoid procedures during or within two weeks of an infection (including chest colds or gastrointestinal problems). Symptoms associated with infections include: Localized redness, fever, chills, night sweats or profuse sweating, burning sensation when voiding, cough, congestion, stuffiness, runny nose, sore throat, diarrhea, nausea, vomiting, cold or Flu symptoms, recent or current infections. It is specially important if the infection is over the area that we intend to treat. Heart and lung problems: Symptoms that may suggest an active cardiopulmonary problem include: cough, chest pain, breathing difficulties or shortness of breath, dizziness, ankle swelling, uncontrolled high or unusually low blood pressure, and/or palpitations. If you are experiencing any of these symptoms, cancel your procedure and contact your primary care physician for an evaluation.  Remember:  Regular Business hours are:  Monday to Thursday 8:00 AM to 4:00 PM  Provider's Schedule: Milinda Pointer, MD:  Procedure days: Tuesday and Thursday 7:30 AM to 4:00 PM  Gillis Santa, MD:  Procedure days: Monday and Wednesday 7:30 AM to 4:00 PM ______________________________________________________________________  Sacroiliac (SI) Joint Injection Patient Information  Description: The sacroiliac joint connects the scrum (very low back and tailbone) to the ilium (a pelvic bone which also forms half of the hip joint).  Normally this joint experiences very little motion.  When this joint becomes inflamed or unstable low back and or hip and pelvis pain may result.  Injection of this joint with local anesthetics (numbing medicines) and steroids can provide diagnostic information and reduce pain.  This injection is performed with the aid of x-ray guidance into the tailbone area while you are lying on your stomach.   You may experience  an electrical sensation down the leg while this is being done.  You may also experience numbness.  We also may ask if we are reproducing your normal pain during the injection.  Conditions which may be treated SI injection:  Low back, buttock, hip or leg pain  Preparation for the Injection:  Do not eat any solid food or dairy products within 8 hours of your appointment.  You may drink clear liquids up to 3 hours before appointment.  Clear liquids include water, black coffee, juice or soda.  No milk or cream please. You may take your regular medications, including pain medications with a sip of water before your appointment.  Diabetics should hold regular insulin (if take separately) and take 1/2 normal NPH dose the morning of the procedure.  Carry some sugar containing items with you to your appointment. A driver must accompany you and be prepared to drive you home after your procedure. Bring all of your current medications with you. An IV may be inserted and sedation may be given at the discretion of the physician. A blood pressure cuff, EKG and other monitors will often be applied during the procedure.  Some patients may need to have extra oxygen administered for a short period.  You will be asked to provide medical information, including your allergies, prior to the procedure.  We must know immediately if you are taking blood thinners (like Coumadin/Warfarin) or if you are allergic to IV iodine contrast (dye).  We must know if you could possible be pregnant.  Possible side effects:  Bleeding from needle site Infection (rare, may require surgery) Nerve injury (rare) Numbness & tingling (temporary) A brief convulsion or seizure Light-headedness (temporary) Pain at injection site (several  days) Decreased blood pressure (temporary) Weakness in the leg (temporary)   Call if you experience:  New onset weakness or numbness of an extremity below the injection site that last more than 8  hours. Hives or difficulty breathing ( go to the emergency room) Inflammation or drainage at the injection site Any new symptoms which are concerning to you  Please note:  Although the local anesthetic injected can often make your back/ hip/ buttock/ leg feel good for several hours after the injections, the pain will likely return.  It takes 3-7 days for steroids to work in the sacroiliac area.  You may not notice any pain relief for at least that one week.  If effective, we will often do a series of three injections spaced 3-6 weeks apart to maximally decrease your pain.  After the initial series, we generally will wait some months before a repeat injection of the same type.  If you have any questions, please call [phone] [facility]  Facet Blocks Patient Information  Description: The facets are joints in the spine between the vertebrae.  Like any joints in the body, facets can become irritated and painful.  Arthritis can also effect the facets.  By injecting steroids and local anesthetic in and around these joints, we can temporarily block the nerve supply to them.  Steroids act directly on irritated nerves and tissues to reduce selling and inflammation which often leads to decreased pain.  Facet blocks may be done anywhere along the spine from the neck to the low back depending upon the location of your pain.   After numbing the skin with local anesthetic (like Novocaine), a small needle is passed onto the facet joints under x-ray guidance.  You may experience a sensation of pressure while this is being done.  The entire block usually lasts about 15-25 minutes.   Conditions which may be treated by facet blocks:  Low back/buttock pain Neck/shoulder pain Certain types of headaches  Preparation for the injection:  Do not eat any solid food or dairy products within 8 hours of your appointment. You may drink clear liquid up to 3 hours before  appointment.  Clear liquids include water, black coffee, juice or soda.  No milk or cream please. You may take your regular medication, including pain medications, with a sip of water before your appointment.  Diabetics should hold regular insulin (if taken separately) and take 1/2 normal NPH dose the morning of the procedure.  Carry some sugar containing items with you to your appointment. A driver must accompany you and be prepared to drive you home after your procedure. Bring all your current medications with you. An IV may be inserted and sedation may be given at the discretion of the physician. A blood pressure cuff, EKG and other monitors will often be applied during the procedure.  Some patients may need to have extra oxygen administered for a short period. You will be asked to provide medical information, including your allergies and medications, prior to the procedure.  We must know immediately if you are taking blood thinners (like Coumadin/Warfarin) or if you are allergic to IV iodine contrast (dye).  We must know if you could possible be pregnant.  Possible side-effects:  Bleeding from needle site Infection (rare, may require surgery) Nerve injury (rare) Numbness & tingling (temporary) Difficulty urinating (rare, temporary) Spinal headache (a headache worse with upright posture) Light-headedness (temporary) Pain at injection site (serveral days) Decreased blood pressure (rare, temporary)  Weakness in arm/leg (temporary) Pressure sensation in back/neck (temporary)   Call if you experience:  Fever/chills associated with headache or increased back/neck pain Headache worsened by an upright position New onset, weakness or numbness of an extremity below the injection site Hives or difficulty breathing (go to the emergency room) Inflammation or drainage at the injection site(s) Severe back/neck pain greater than usual New symptoms which are concerning to you  Please  note:  Although the local anesthetic injected can often make your back or neck feel good for several hours after the injection, the pain will likely return. It takes 3-7 days for steroids to work.  You may not notice any pain relief for at least one week.  If effective, we will often do a series of 2-3 injections spaced 3-6 weeks apart to maximally decrease your pain.  After the initial series, you may be a candidate for a more permanent nerve block of the facets.  If you have any questions, please call #[phone]) [facility]

## 2021-10-11 ENCOUNTER — Other Ambulatory Visit: Payer: Self-pay

## 2021-10-22 ENCOUNTER — Other Ambulatory Visit: Payer: Self-pay

## 2021-10-23 ENCOUNTER — Other Ambulatory Visit: Payer: Self-pay

## 2021-11-08 ENCOUNTER — Other Ambulatory Visit: Payer: Self-pay

## 2021-11-19 ENCOUNTER — Ambulatory Visit: Payer: 59 | Admitting: Pain Medicine

## 2021-11-25 DIAGNOSIS — E663 Overweight: Secondary | ICD-10-CM | POA: Diagnosis not present

## 2021-12-03 ENCOUNTER — Other Ambulatory Visit: Payer: Self-pay

## 2021-12-05 ENCOUNTER — Encounter: Payer: Self-pay | Admitting: Pain Medicine

## 2021-12-05 ENCOUNTER — Ambulatory Visit
Admission: RE | Admit: 2021-12-05 | Discharge: 2021-12-05 | Disposition: A | Discharge status: Home / Self Care | Payer: 59 | Source: Ambulatory Visit | Attending: Pain Medicine | Admitting: Pain Medicine

## 2021-12-05 ENCOUNTER — Other Ambulatory Visit: Payer: Self-pay

## 2021-12-05 ENCOUNTER — Ambulatory Visit (HOSPITAL_BASED_OUTPATIENT_CLINIC_OR_DEPARTMENT_OTHER): Payer: 59 | Admitting: Pain Medicine

## 2021-12-05 VITALS — BP 134/96 | HR 93 | Temp 97.4°F | Resp 18 | Ht 62.0 in | Wt 154.0 lb

## 2021-12-05 DIAGNOSIS — M4608 Spinal enthesopathy, sacral and sacrococcygeal region: Secondary | ICD-10-CM | POA: Diagnosis not present

## 2021-12-05 DIAGNOSIS — M9904 Segmental and somatic dysfunction of sacral region: Secondary | ICD-10-CM | POA: Insufficient documentation

## 2021-12-05 DIAGNOSIS — M549 Dorsalgia, unspecified: Secondary | ICD-10-CM | POA: Insufficient documentation

## 2021-12-05 DIAGNOSIS — Z882 Allergy status to sulfonamides status: Secondary | ICD-10-CM | POA: Insufficient documentation

## 2021-12-05 DIAGNOSIS — M47817 Spondylosis without myelopathy or radiculopathy, lumbosacral region: Secondary | ICD-10-CM | POA: Diagnosis not present

## 2021-12-05 DIAGNOSIS — M47818 Spondylosis without myelopathy or radiculopathy, sacral and sacrococcygeal region: Secondary | ICD-10-CM | POA: Diagnosis not present

## 2021-12-05 DIAGNOSIS — M779 Enthesopathy, unspecified: Secondary | ICD-10-CM

## 2021-12-05 DIAGNOSIS — M47816 Spondylosis without myelopathy or radiculopathy, lumbar region: Secondary | ICD-10-CM | POA: Diagnosis not present

## 2021-12-05 DIAGNOSIS — M545 Low back pain, unspecified: Secondary | ICD-10-CM | POA: Insufficient documentation

## 2021-12-05 DIAGNOSIS — G8929 Other chronic pain: Secondary | ICD-10-CM

## 2021-12-05 DIAGNOSIS — M47898 Other spondylosis, sacral and sacrococcygeal region: Secondary | ICD-10-CM | POA: Diagnosis not present

## 2021-12-05 DIAGNOSIS — M533 Sacrococcygeal disorders, not elsewhere classified: Secondary | ICD-10-CM | POA: Insufficient documentation

## 2021-12-05 MED ORDER — ROPIVACAINE HCL 2 MG/ML IJ SOLN
9.0000 mL | Freq: Once | INTRAMUSCULAR | Status: AC
  Administered 2021-12-05: 9 mL via PERINEURAL

## 2021-12-05 MED ORDER — ROPIVACAINE HCL 2 MG/ML IJ SOLN
4.0000 mL | Freq: Once | INTRAMUSCULAR | Status: AC
  Administered 2021-12-05: 4 mL via INTRA_ARTICULAR

## 2021-12-05 MED ORDER — METHYLPREDNISOLONE ACETATE 80 MG/ML IJ SUSP
80.0000 mg | Freq: Once | INTRAMUSCULAR | Status: AC
  Administered 2021-12-05: 80 mg via INTRA_ARTICULAR
  Filled 2021-12-05: qty 1

## 2021-12-05 MED ORDER — PENTAFLUOROPROP-TETRAFLUOROETH EX AERO
INHALATION_SPRAY | Freq: Once | CUTANEOUS | Status: DC
  Filled 2021-12-05: qty 116

## 2021-12-05 MED ORDER — LACTATED RINGERS IV SOLN
1000.0000 mL | Freq: Once | INTRAVENOUS | Status: AC
  Administered 2021-12-05: 1000 mL via INTRAVENOUS

## 2021-12-05 MED ORDER — TRIAMCINOLONE ACETONIDE 40 MG/ML IJ SUSP
40.0000 mg | Freq: Once | INTRAMUSCULAR | Status: AC
  Administered 2021-12-05: 40 mg
  Filled 2021-12-05: qty 1

## 2021-12-05 MED ORDER — MIDAZOLAM HCL 5 MG/5ML IJ SOLN
0.5000 mg | Freq: Once | INTRAMUSCULAR | Status: AC
  Administered 2021-12-05: 2 mg via INTRAVENOUS
  Filled 2021-12-05: qty 5

## 2021-12-05 MED ORDER — LIDOCAINE HCL 2 % IJ SOLN
20.0000 mL | Freq: Once | INTRAMUSCULAR | Status: AC
  Administered 2021-12-05: 400 mg
  Filled 2021-12-05: qty 20

## 2021-12-05 NOTE — Progress Notes (Signed)
PROVIDER NOTE: Interpretation of information contained herein should be left to medically-trained personnel. Specific patient instructions are provided elsewhere under "Patient Instructions" section of medical record. This document was created in part using STT-dictation technology, any transcriptional errors that may result from this process are unintentional.  Patient: Tammy Deleon Type: Established DOB: 1986-07-26 MRN: [id number] PCP: Adin Hector, MD  Service: Procedure DOS: 12/05/2021 Setting: Ambulatory Location: Ambulatory outpatient facility Delivery: Face-to-face Provider: Gaspar Cola, MD Specialty: Interventional Pain Management Specialty designation: 09 Location: Outpatient facility Ref. Prov.: Adin Hector, MD    Primary Reason for Visit: Interventional Pain Management Treatment. CC: Back Pain (Low back right)    Procedure:          Anesthesia, Analgesia, Anxiolysis:  Procedure #1: Type: Medial Branch Facet Block #2 Primary Purpose: Diagnostic Region: Lumbar Level: L2, L3, L4, L5, & S1 Medial Branch Level(s) Target Area: For Lumbar Facet blocks, the target is the groove formed by the junction of the transverse process and superior articular process. For the L5 dorsal ramus, the target is the notch between superior articular process and sacral ala. For the S1 dorsal ramus, the target is the superior and lateral edge of the posterior S1 Sacral foramen. Approach: Posterior, paramedial, percutaneous approach. Laterality: Right  Procedure #2: Type: Sacroiliac Joint Block  #2  Primary Purpose: Diagnostic Region: Posterior Lumbosacral Level: PSIS (Posterior Superior Iliac Spine) Sacroiliac Joint Target Area: For upper sacroiliac joint block(s), the target is the superior and posterior margin of the sacroiliac joint. Approach: Ipsilateral approach. Laterality: Right  Anesthesia: Local (1-2% Lidocaine)  Anxiolysis: None  Sedation: None  Guidance:  Fluoroscopy           Position: Prone   1. Lumbar facet joint syndrome (Right)   2. Spondylosis without myelopathy or radiculopathy, lumbosacral region   3. Lumbosacral facet arthropathy (L5-S1)   4. Chronic low back pain (1ry area of Pain) (Right) w/o sciatica   5. Chronic sacroiliac joint pain (Right)   6. Other spondylosis, sacral and sacrococcygeal region   7. Enthesopathy of sacroiliac joint (Right)   8. Somatic dysfunction of sacroiliac joint (Right)    NAS-11 Pain score:   Pre-procedure: 7 /10   Post-procedure: 3 /10     Pre-op H&P Assessment:  [name] is a 36 y.o. (year old), female patient, seen today for interventional treatment. She  has a past surgical history that includes Gallbladder surgery; Hemi-microdiscectomy lumbar laminectomy level 1 (Right, 01/02/2020); and Hemi-microdiscectomy lumbar laminectomy level 1 (Right, 11/05/2020). [name] has a current medication list which includes the following prescription(s): amphetamine-dextroamphetamine, hydrocodone-acetaminophen, [START ON 12/08/2021] hydrocodone-acetaminophen, ibuprofen, lamotrigine, levonorgestrel-ethinyl estradiol, phentermine, trazodone, valacyclovir, hydrocodone-acetaminophen, lamotrigine, levonorgestrel-ethinyl estradiol, phentermine, and phentermine, and the following Facility-Administered Medications: pentafluoroprop-tetrafluoroeth. Her primarily concern today is the Back Pain (Low back right)  Initial Vital Signs:  Pulse/HCG Rate: 91ECG Heart Rate: (!) 101 Temp: (!) 97.4 F (36.3 C) Resp: 16 BP: (!) 144/98 SpO2: 98 %  BMI: Estimated body mass index is 28.17 kg/m as calculated from the following:   Height as of this encounter: 5' 2"  (1.575 m).   Weight as of this encounter: 154 lb (69.9 kg).  Risk Assessment: Allergies: Reviewed. She is allergic to sulfa antibiotics.  Allergy Precautions: None required Coagulopathies: Reviewed. None identified.  Blood-thinner therapy: None at this time Active  Infection(s): Reviewed. None identified. [name] is afebrile  Site Confirmation: [name] was asked to confirm the procedure and laterality before marking the site Procedure  checklist: Completed Consent: Before the procedure and under the influence of no sedative(s), amnesic(s), or anxiolytics, the patient was informed of the treatment options, risks and possible complications. To fulfill our ethical and legal obligations, as recommended by the American Medical Association's Code of Ethics, I have informed the patient of my clinical impression; the nature and purpose of the treatment or procedure; the risks, benefits, and possible complications of the intervention; the alternatives, including doing nothing; the risk(s) and benefit(s) of the alternative treatment(s) or procedure(s); and the risk(s) and benefit(s) of doing nothing. The patient was provided information about the general risks and possible complications associated with the procedure. These may include, but are not limited to: failure to achieve desired goals, infection, bleeding, organ or nerve damage, allergic reactions, paralysis, and death. In addition, the patient was informed of those risks and complications associated to Spine-related procedures, such as failure to decrease pain; infection (i.e.: Meningitis, epidural or intraspinal abscess); bleeding (i.e.: epidural hematoma, subarachnoid hemorrhage, or any other type of intraspinal or peri-dural bleeding); organ or nerve damage (i.e.: Any type of peripheral nerve, nerve root, or spinal cord injury) with subsequent damage to sensory, motor, and/or autonomic systems, resulting in permanent pain, numbness, and/or weakness of one or several areas of the body; allergic reactions; (i.e.: anaphylactic reaction); and/or death. Furthermore, the patient was informed of those risks and complications associated with the medications. These include, but are not limited to: allergic reactions  (i.e.: anaphylactic or anaphylactoid reaction(s)); adrenal axis suppression; blood sugar elevation that in diabetics may result in ketoacidosis or comma; water retention that in patients with history of congestive heart failure may result in shortness of breath, pulmonary edema, and decompensation with resultant heart failure; weight gain; swelling or edema; medication-induced neural toxicity; particulate matter embolism and blood vessel occlusion with resultant organ, and/or nervous system infarction; and/or aseptic necrosis of one or more joints. Finally, the patient was informed that Medicine is not an exact science; therefore, there is also the possibility of unforeseen or unpredictable risks and/or possible complications that may result in a catastrophic outcome. The patient indicated having understood very clearly. We have given the patient no guarantees and we have made no promises. Enough time was given to the patient to ask questions, all of which were answered to the patient's satisfaction. Ms. Kimmer has indicated that she wanted to continue with the procedure. Attestation: I, the ordering provider, attest that I have discussed with the patient the benefits, risks, side-effects, alternatives, likelihood of achieving goals, and potential problems during recovery for the procedure that I have provided informed consent. Date   Time: 12/05/2021  8:55 AM  Pre-Procedure Preparation:  Monitoring: As per clinic protocol. Respiration, ETCO2, SpO2, BP, heart rate and rhythm monitor placed and checked for adequate function Safety Precautions: Patient was assessed for positional comfort and pressure points before starting the procedure. Time-out: I initiated and conducted the "Time-out" before starting the procedure, as per protocol. The patient was asked to participate by confirming the accuracy of the "Time Out" information. Verification of the correct person, site, and procedure were performed and  confirmed by me, the nursing staff, and the patient. "Time-out" conducted as per Joint Commission's Universal Protocol (UP.01.01.01). Time: 0945  Description of Procedure #1:   Time-out: "Time-out" completed before starting procedure, as per protocol. Area Prepped: Entire Posterior Lumbosacral Region DuraPrep (Iodine Povacrylex [0.7% available iodine] and Isopropyl Alcohol, 74% w/w) Safety Precautions: Aspiration looking for blood return was conducted prior to all injections. At no  point did we inject any substances, as a needle was being advanced. No attempts were made at seeking any paresthesias. Safe injection practices and needle disposal techniques used. Medications properly checked for expiration dates. SDV (single dose vial) medications used.  Description of the Procedure: Protocol guidelines were followed. The patient was placed in position over the fluoroscopy table. The target area was identified and the area prepped in the usual manner. Skin & deeper tissues infiltrated with local anesthetic. Appropriate amount of time allowed to pass for local anesthetics to take effect. The procedure needle was introduced through the skin, ipsilateral to the reported pain, and advanced to the target area. Employing the Medial Branch Technique, the needles were advanced to the angle made by the superior and medial portion of the transverse process, and the lateral and inferior portion of the superior articulating process of the targeted vertebral bodies. This area is known as [employer] or the [place] of the [employer]. A procedure needle was introduced through the skin, and this time advanced to the angle made by the superior and medial border of the sacral ala, and the lateral border of the S1 vertebral body. This last needle was later repositioned at the superior and lateral border of the posterior S1 foramen. Negative aspiration confirmed. Solution injected in intermittent fashion, asking for systemic  symptoms every 0.5cc of injectate. The needles were then removed and the area cleansed, making sure to leave some of the prepping solution back to take advantage of its long term bactericidal properties. Start Time: 0945 hrs. Materials:  Needle(s) Type: Spinal Needle Gauge: 22G Length: 3.5-in Medication(s): Please see orders for medications and dosing details.  Description of Procedure # 2:   Area Prepped: Entire Posterior Lumbosacral Region DuraPrep (Iodine Povacrylex [0.7% available iodine] and Isopropyl Alcohol, 74% w/w) Safety Precautions: Aspiration looking for blood return was conducted prior to all injections. At no point did we inject any substances, as a needle was being advanced. No attempts were made at seeking any paresthesias. Safe injection practices and needle disposal techniques used. Medications properly checked for expiration dates. SDV (single dose vial) medications used. Description of the Procedure: Protocol guidelines were followed. The patient was placed in position over the fluoroscopy table. The target area was identified and the area prepped in the usual manner. Skin & deeper tissues infiltrated with local anesthetic. Appropriate amount of time allowed to pass for local anesthetics to take effect. The procedure needle was advanced under fluoroscopic guidance into the sacroiliac joint until a firm endpoint was obtained. Proper needle placement secured. Negative aspiration confirmed. Solution injected in intermittent fashion, asking for systemic symptoms every 0.5cc of injectate. The needles were then removed and the area cleansed, making sure to leave some of the prepping solution back to take advantage of its long term bactericidal properties. Vitals:   12/05/21 0948 12/05/21 0953 12/05/21 0959 12/05/21 1004  BP: (!) 155/107 (!) 130/99 (!) 140/107 (!) 134/96  Pulse:    93  Resp: 12 18 18 18   Temp:      SpO2: 100% 99% 99% 100%  Weight:      Height:        End Time:  0958 hrs. Materials:  Needle(s) Type: Spinal Needle Gauge: 22G Length: 5.0-in Medication(s): Please see orders for medications and dosing details.  Imaging Guidance (Spinal):          Type of Imaging Technique: Fluoroscopy Guidance (Spinal) Indication(s): Assistance in needle guidance and placement for procedures requiring needle placement in  or near specific anatomical locations not easily accessible without such assistance. Exposure Time: Please see nurses notes. Contrast: None used. Fluoroscopic Guidance: I was personally present during the use of fluoroscopy. "Tunnel Vision Technique" used to obtain the best possible view of the target area. Parallax error corrected before commencing the procedure. "Direction-depth-direction" technique used to introduce the needle under continuous pulsed fluoroscopy. Once target was reached, antero-posterior, oblique, and lateral fluoroscopic projection used confirm needle placement in all planes. Images permanently stored in EMR. Interpretation: No contrast injected. I personally interpreted the imaging intraoperatively. Adequate needle placement confirmed in multiple planes. Permanent images saved into the patient's record.  Antibiotic Prophylaxis:   Anti-infectives (From admission, onward)    None      Indication(s): None identified  Post-operative Assessment:  Post-procedure Vital Signs:  Pulse/HCG Rate: 9398 Temp: (!) 97.4 F (36.3 C) Resp: 18 BP: (!) 134/96 SpO2: 100 %  EBL: None  Complications: No immediate post-treatment complications observed by team, or reported by patient.  Note: The patient tolerated the entire procedure well. A repeat set of vitals were taken after the procedure and the patient was kept under observation following institutional policy, for this type of procedure. Post-procedural neurological assessment was performed, showing return to baseline, prior to discharge. The patient was provided with post-procedure  discharge instructions, including a section on how to identify potential problems. Should any problems arise concerning this procedure, the patient was given instructions to immediately contact us, at any time, without hesitation. In any case, we plan to contact the patient by telephone for a follow-up status report regarding this interventional procedure.  Comments:  No additional relevant information.  Plan of Care  Orders:  Orders Placed This Encounter  Procedures   LUMBAR FACET(MEDIAL BRANCH NERVE BLOCK) MBNB    Scheduling Instructions:     Procedure: Lumbar facet block (AKA.: Lumbosacral medial branch nerve block)     Side: Right-sided     Level: L3-4 & L5-S1 Facets (L2, L3, L4, L5, & S1 Medial Branch Nerves)     Sedation: Patient's choice.     Timeframe: Today    Order Specific Question:   Where will this procedure be performed?    Answer:   [facility]   SACROILIAC JOINT INJECTION    Scheduling Instructions:     Side: Right-sided     Sedation: Patient's choice.     Timeframe: Today    Order Specific Question:   Where will this procedure be performed?    Answer:   [facility]   DG PAIN CLINIC C-ARM 1-60 MIN NO REPORT    Intraoperative interpretation by procedural physician at [place].    Standing Status:   Standing    Number of Occurrences:   1    Order Specific Question:   Reason for exam:    Answer:   Assistance in needle guidance and placement for procedures requiring needle placement in or near specific anatomical locations not easily accessible without such assistance.   Informed Consent Details: Physician/Practitioner Attestation; Transcribe to consent form and obtain patient signature    Nursing Order: Transcribe to consent form and obtain patient signature. Note: Always confirm laterality of pain with Ms. Hardin Negus, before procedure.    Order Specific Question:   Physician/Practitioner attestation of informed consent for  procedure/surgical case    Answer:   I, the physician/practitioner, attest that I have discussed with the patient the benefits, risks, side effects, alternatives, likelihood of achieving goals and potential problems  during recovery for the procedure that I have provided informed consent.    Order Specific Question:   Procedure    Answer:   Lumbar Facet Block  under fluoroscopic guidance    Order Specific Question:   Physician/Practitioner performing the procedure    Answer:   Elnathan Fulford A. Dossie Arbour MD    Order Specific Question:   Indication/Reason    Answer:   Low Back Pain, with our without leg pain, due to Facet Joint Arthralgia (Joint Pain) Spondylosis (Arthritis of the Spine), without myelopathy or radiculopathy (Nerve Damage).   Provide equipment / supplies at bedside    "Block Tray" (Disposable   single use) Needle type: SpinalSpinal Amount/quantity: 4 Size: Regular (3.5-inch) Gauge: 22G    Standing Status:   Standing    Number of Occurrences:   1    Order Specific Question:   Specify    Answer:   Block Tray   Informed Consent Details: Physician/Practitioner Attestation; Transcribe to consent form and obtain patient signature    Nursing Order: Transcribe to consent form and obtain patient signature. Note: Always confirm laterality of pain with Ms. Hardin Negus, before procedure.    Order Specific Question:   Physician/Practitioner attestation of informed consent for procedure/surgical case    Answer:   I, the physician/practitioner, attest that I have discussed with the patient the benefits, risks, side effects, alternatives, likelihood of achieving goals and potential problems during recovery for the procedure that I have provided informed consent.    Order Specific Question:   Procedure    Answer:   Sacroiliac Joint Block    Order Specific Question:   Physician/Practitioner performing the procedure    Answer:   Kelcie Currie A. Dossie Arbour, MD    Order Specific Question:   Indication/Reason     Answer:   Chronic Low Back and Hip Pain secondary to Sacroiliac Joint Pain (Arthralgia/Arthropathy)   Chronic Opioid Analgesic:  Hydrocodone/APAP 5/325, 1 tab p.o. 4 times daily (#56) (filled 02/27/2021) MME/day: 20 mg/day   Medications ordered for procedure: Meds ordered this encounter  Medications   lidocaine (XYLOCAINE) 2 % (with pres) injection 400 mg   pentafluoroprop-tetrafluoroeth (GEBAUERS) aerosol   lactated ringers infusion 1,000 mL   midazolam (VERSED) 5 MG/5ML injection 0.5-2 mg    Make sure Flumazenil is available in the pyxis when using this medication. If oversedation occurs, administer 0.2 mg IV over 15 sec. If after 45 sec no response, administer 0.2 mg again over 1 min; may repeat at 1 min intervals; not to exceed 4 doses (1 mg)   ropivacaine (PF) 2 mg/mL (0.2%) (NAROPIN) injection 9 mL   triamcinolone acetonide (KENALOG-40) injection 40 mg   methylPREDNISolone acetate (DEPO-MEDROL) injection 80 mg   ropivacaine (PF) 2 mg/mL (0.2%) (NAROPIN) injection 4 mL   Medications administered: We administered lidocaine, lactated ringers, midazolam, ropivacaine (PF) 2 mg/mL (0.2%), triamcinolone acetonide, methylPREDNISolone acetate, and ropivacaine (PF) 2 mg/mL (0.2%).  See the medical record for exact dosing, route, and time of administration.  Follow-up plan:   Return in about 2 weeks (around 12/19/2021) for Proc-day (T,Th), (VV), (PPE).       Interventional Therapies  Risk   Complexity Considerations:   Estimated body mass index is 29.08 kg/m as calculated from the following:   Height as of this encounter: 5' 2"  (1.575 m).   Weight as of this encounter: 159 lb (72.1 kg). WNL   Planned   Pending:   Diagnostic right lumbar facet MBB #2 + right SI joint block #  2    Under consideration:   Possible right lumbar facet RFA  Possible right SI joint RFA  Possible candidate for right thoracolumbar spinal cord stimulator trial/implant    Completed by Dr. Sharlet Salina: Right L5-S1 TFESI x1 (12/07/2019)  Right S1 tTFESI x1 (12/07/2019)   Completed:   Diagnostic right SI joint block x1 (04/16/2021) (100/100/50/0)  Diagnostic right caudal ESI x1 + diagnostic epidurogram (05/16/2021) (100/100/0/0)  Therapeutic right Racz procedure x1 (06/13/2021) (75/75/70 5/100% for the lower extremity pain but not the back) Diagnostic right lumbar facet MBB x1 (09/17/2021) (80/60/50/50)    Therapeutic   Palliative (PRN) options:   None established     Recent Visits Date Type Provider Dept  10/09/21 Office Visit Milinda Pointer, MD [facility]  09/17/21 Procedure visit Milinda Pointer, MD [facility]  Showing recent visits within past 90 days and meeting all other requirements Today's Visits Date Type Provider Dept  12/05/21 Procedure visit Milinda Pointer, MD [facility]  Showing today's visits and meeting all other requirements Future Appointments Date Type Provider Dept  12/19/21 Appointment Milinda Pointer, [facility]  12/30/21 Appointment Milinda Pointer, MD [facility]  Showing future appointments within next 90 days and meeting all other requirements  Disposition: Discharge home  Discharge (Date   Time): 12/05/2021; 1008 hrs.   Primary Care Physician: Adin Hector, MD Location: [facility] Note by: Gaspar Cola, MD Date: 12/05/2021; Time: 10:19 AM  Disclaimer:  Medicine is not an Chief Strategy Officer. The only guarantee in medicine is that nothing is guaranteed. It is important to note that the decision to proceed with this intervention was based on the information collected from the patient. The Data and conclusions were drawn from the patient's questionnaire, the interview, and the physical examination. Because the information was provided in large part by the patient, it cannot be guaranteed that it has not been purposely or unconsciously manipulated.  Every effort has been made to obtain as much relevant data as possible for this evaluation. It is important to note that the conclusions that lead to this procedure are derived in large part from the available data. Always take into account that the treatment will also be dependent on availability of resources and existing treatment guidelines, considered by other Pain Management Practitioners as being common knowledge and practice, at the time of the intervention. For Medico-Legal purposes, it is also important to point out that variation in procedural techniques and pharmacological choices are the acceptable norm. The indications, contraindications, technique, and results of the above procedure should only be interpreted and judged by a Board-Certified Interventional Pain Specialist with extensive familiarity and expertise in the same exact procedure and technique.

## 2021-12-05 NOTE — Patient Instructions (Addendum)
____________________________________________________________________________________________  Post-Procedure Discharge Instructions  Instructions: Apply ice:  Purpose: This will minimize any swelling and discomfort after procedure.  When: Day of procedure, as soon as you get home. How: Fill a plastic sandwich bag with crushed ice. Cover it with a small towel and apply to injection site. How long: (15 min on, 15 min off) Apply for 15 minutes then remove x 15 minutes.  Repeat sequence on day of procedure, until you go to bed. Apply heat:  Purpose: To treat any soreness and discomfort from the procedure. When: Starting the next day after the procedure. How: Apply heat to procedure site starting the day following the procedure. How long: May continue to repeat daily, until discomfort goes away. Food intake: Start with clear liquids (like water) and advance to regular food, as tolerated.  Physical activities: Keep activities to a minimum for the first 8 hours after the procedure. After that, then as tolerated. Driving: If you have received any sedation, be responsible and do not drive. You are not allowed to drive for 24 hours after having sedation. Blood thinner: (Applies only to those taking blood thinners) You may restart your blood thinner 6 hours after your procedure. Insulin: (Applies only to Diabetic patients taking insulin) As soon as you can eat, you may resume your normal dosing schedule. Infection prevention: Keep procedure site clean and dry. Shower daily and clean area with soap and water. Post-procedure Pain Diary: Extremely important that this be done correctly and accurately. Recorded information will be used to determine the next step in treatment. For the purpose of accuracy, follow these rules: Evaluate only the area treated. Do not report or include pain from an untreated area. For the purpose of this evaluation, ignore all other areas of pain, except for the treated area. After  your procedure, avoid taking a long nap and attempting to complete the pain diary after you wake up. Instead, set your alarm clock to go off every hour, on the hour, for the initial 8 hours after the procedure. Document the duration of the numbing medicine, and the relief you are getting from it. Do not go to sleep and attempt to complete it later. It will not be accurate. If you received sedation, it is likely that you were given a medication that may cause amnesia. Because of this, completing the diary at a later time may cause the information to be inaccurate. This information is needed to plan your care. Follow-up appointment: Keep your post-procedure follow-up evaluation appointment after the procedure (usually 2 weeks for most procedures, 6 weeks for radiofrequencies). DO NOT FORGET to bring you pain diary with you.   Expect: (What should I expect to see with my procedure?) From numbing medicine (AKA: Local Anesthetics): Numbness or decrease in pain. You may also experience some weakness, which if present, could last for the duration of the local anesthetic. Onset: Full effect within 15 minutes of injected. Duration: It will depend on the type of local anesthetic used. On the average, 1 to 8 hours.  From steroids (Applies only if steroids were used): Decrease in swelling or inflammation. Once inflammation is improved, relief of the pain will follow. Onset of benefits: Depends on the amount of swelling present. The more swelling, the longer it will take for the benefits to be seen. In some cases, up to 10 days. Duration: Steroids will stay in the system x 2 weeks. Duration of benefits will depend on multiple posibilities including persistent irritating factors. Side-effects: If present, they  may typically last 2 weeks (the duration of the steroids). Frequent: Cramps (if they occur, drink Gatorade and take over-the-counter Magnesium 450-500 mg once to twice a day); water retention with temporary  weight gain; increases in blood sugar; decreased immune system response; increased appetite. Occasional: Facial flushing (red, warm cheeks); mood swings; menstrual changes. Uncommon: Long-term decrease or suppression of natural hormones; bone thinning. (These are more common with higher doses or more frequent use. This is why we prefer that our patients avoid having any injection therapies in other practices.)  Very Rare: Severe mood changes; psychosis; aseptic necrosis. From procedure: Some discomfort is to be expected once the numbing medicine wears off. This should be minimal if ice and heat are applied as instructed.  Call if: (When should I call?) You experience numbness and weakness that gets worse with time, as opposed to wearing off. New onset bowel or bladder incontinence. (Applies only to procedures done in the spine)  Emergency Numbers: Durning business hours (Monday - Thursday, 8:00 AM - 4:00 PM) (Friday, 9:00 AM - 12:00 Noon): [phone] After hours: [phone] NOTE: If you are having a problem and are unable connect with, or to talk to a provider, then go to your nearest urgent care or emergency department. If the problem is serious and urgent, please call 911. ____________________________________________________________________________________________  Pain Management Discharge Instructions  General Discharge Instructions :  If you need to reach your doctor call: Monday-Friday 8:00 am - 4:00 pm at [phone] or toll free [phone].  After clinic hours [phone] to have operator reach doctor.  Bring all of your medication bottles to all your appointments in the pain clinic.  To cancel or reschedule your appointment with Pain Management please remember to call 24 hours in advance to avoid a fee.  Refer to the educational materials which you have been given on: General Risks, I had my Procedure. Discharge Instructions, Post Sedation.  Post Procedure  Instructions:  The drugs you were given will stay in your system until tomorrow, so for the next 24 hours you should not drive, make any legal decisions or drink any alcoholic beverages.  You may eat anything you prefer, but it is better to start with liquids then soups and crackers, and gradually work up to solid foods.  Please notify your doctor immediately if you have any unusual bleeding, trouble breathing or pain that is not related to your normal pain.  Depending on the type of procedure that was done, some parts of your body may feel week and/or numb.  This usually clears up by tonight or the next day.  Walk with the use of an assistive device or accompanied by an adult for the 24 hours.  You may use ice on the affected area for the first 24 hours.  Put ice in a Ziploc bag and cover with a towel and place against area 15 minutes on 15 minutes off.  You may switch to heat after 24 hours.Facet Blocks Patient Information  Description: The facets are joints in the spine between the vertebrae.  Like any joints in the body, facets can become irritated and painful.  Arthritis can also effect the facets.  By injecting steroids and local anesthetic in and around these joints, we can temporarily block the nerve supply to them.  Steroids act directly on irritated nerves and tissues to reduce selling and inflammation which often leads to decreased pain.  Facet blocks may be done anywhere along the spine from the neck to the  low back depending upon the location of your pain.   After numbing the skin with local anesthetic (like Novocaine), a small needle is passed onto the facet joints under x-ray guidance.  You may experience a sensation of pressure while this is being done.  The entire block usually lasts about 15-25 minutes.   Conditions which may be treated by facet blocks:  Low back/buttock pain Neck/shoulder pain Certain types of headaches  Preparation for the injection:  Do not eat any  solid food or dairy products within 8 hours of your appointment. You may drink clear liquid up to 3 hours before appointment.  Clear liquids include water, black coffee, juice or soda.  No milk or cream please. You may take your regular medication, including pain medications, with a sip of water before your appointment.  Diabetics should hold regular insulin (if taken separately) and take 1/2 normal NPH dose the morning of the procedure.  Carry some sugar containing items with you to your appointment. A driver must accompany you and be prepared to drive you home after your procedure. Bring all your current medications with you. An IV may be inserted and sedation may be given at the discretion of the physician. A blood pressure cuff, EKG and other monitors will often be applied during the procedure.  Some patients may need to have extra oxygen administered for a short period. You will be asked to provide medical information, including your allergies and medications, prior to the procedure.  We must know immediately if you are taking blood thinners (like Coumadin/Warfarin) or if you are allergic to IV iodine contrast (dye).  We must know if you could possible be pregnant.  Possible side-effects:  Bleeding from needle site Infection (rare, may require surgery) Nerve injury (rare) Numbness & tingling (temporary) Difficulty urinating (rare, temporary) Spinal headache (a headache worse with upright posture) Light-headedness (temporary) Pain at injection site (serveral days) Decreased blood pressure (rare, temporary) Weakness in arm/leg (temporary) Pressure sensation in back/neck (temporary)   Call if you experience:  Fever/chills associated with headache or increased back/neck pain Headache worsened by an upright position New onset, weakness or numbness of an extremity below the injection site Hives or difficulty breathing (go to the emergency room) Inflammation or drainage at the injection  site(s) Severe back/neck pain greater than usual New symptoms which are concerning to you  Please note:  Although the local anesthetic injected can often make your back or neck feel good for several hours after the injection, the pain will likely return. It takes 3-7 days for steroids to work.  You may not notice any pain relief for at least one week.  If effective, we will often do a series of 2-3 injections spaced 3-6 weeks apart to maximally decrease your pain.  After the initial series, you may be a candidate for a more permanent nerve block of the facets.  If you have any questions, please call #[phone] [facility]

## 2021-12-05 NOTE — Progress Notes (Signed)
Safety precautions to be maintained throughout the outpatient stay will include: orient to surroundings, keep bed in low position, maintain call bell within reach at all times, provide assistance with transfer out of bed and ambulation.  

## 2021-12-06 ENCOUNTER — Telehealth: Payer: Self-pay | Admitting: *Deleted

## 2021-12-06 NOTE — Telephone Encounter (Signed)
No problems post procedure. 

## 2021-12-09 ENCOUNTER — Other Ambulatory Visit: Payer: Self-pay

## 2021-12-18 ENCOUNTER — Encounter: Payer: Self-pay | Admitting: Pain Medicine

## 2021-12-18 DIAGNOSIS — E663 Overweight: Secondary | ICD-10-CM | POA: Diagnosis not present

## 2021-12-18 DIAGNOSIS — Z0000 Encounter for general adult medical examination without abnormal findings: Secondary | ICD-10-CM | POA: Diagnosis not present

## 2021-12-18 DIAGNOSIS — E538 Deficiency of other specified B group vitamins: Secondary | ICD-10-CM | POA: Diagnosis not present

## 2021-12-18 DIAGNOSIS — Z13220 Encounter for screening for lipoid disorders: Secondary | ICD-10-CM | POA: Diagnosis not present

## 2021-12-19 ENCOUNTER — Other Ambulatory Visit: Payer: Self-pay

## 2021-12-19 ENCOUNTER — Ambulatory Visit: Payer: 59 | Attending: Pain Medicine | Admitting: Pain Medicine

## 2021-12-19 DIAGNOSIS — Z0000 Encounter for general adult medical examination without abnormal findings: Secondary | ICD-10-CM | POA: Diagnosis not present

## 2021-12-19 DIAGNOSIS — M545 Low back pain, unspecified: Secondary | ICD-10-CM | POA: Diagnosis not present

## 2021-12-19 DIAGNOSIS — M519 Unspecified thoracic, thoracolumbar and lumbosacral intervertebral disc disorder: Secondary | ICD-10-CM | POA: Diagnosis not present

## 2021-12-19 DIAGNOSIS — G8929 Other chronic pain: Secondary | ICD-10-CM

## 2021-12-19 DIAGNOSIS — M533 Sacrococcygeal disorders, not elsewhere classified: Secondary | ICD-10-CM

## 2021-12-19 DIAGNOSIS — M47816 Spondylosis without myelopathy or radiculopathy, lumbar region: Secondary | ICD-10-CM | POA: Diagnosis not present

## 2021-12-19 DIAGNOSIS — M549 Dorsalgia, unspecified: Secondary | ICD-10-CM | POA: Diagnosis not present

## 2021-12-19 DIAGNOSIS — K5190 Ulcerative colitis, unspecified, without complications: Secondary | ICD-10-CM | POA: Diagnosis not present

## 2021-12-19 DIAGNOSIS — E538 Deficiency of other specified B group vitamins: Secondary | ICD-10-CM | POA: Diagnosis not present

## 2021-12-19 MED ORDER — AMPHETAMINE-DEXTROAMPHETAMINE 30 MG PO TABS
1.0000 | ORAL_TABLET | Freq: Two times a day (BID) | ORAL | 0 refills | Status: DC
  Filled 2021-12-19 – 2022-02-07 (×5): qty 180, 90d supply, fill #0

## 2021-12-19 MED ORDER — TRAZODONE HCL 100 MG PO TABS
ORAL_TABLET | ORAL | 3 refills | Status: DC
  Filled 2021-12-19: qty 135, 90d supply, fill #0
  Filled 2022-03-25: qty 135, 90d supply, fill #1

## 2021-12-19 MED FILL — Valacyclovir HCl Tab 500 MG: ORAL | 6 days supply | Qty: 12 | Fill #2 | Status: AC

## 2021-12-19 NOTE — Progress Notes (Signed)
Patient: Tammy Deleon  Service Category: E/M  Provider: Gaspar Cola, MD  DOB: 07-Nov-1985  DOS: 12/19/2021  Location: Office  MRN: [id number]  Setting: Ambulatory outpatient  Referring Provider: Adin Hector, MD  Type: Established Patient  Specialty: Interventional Pain Management  PCP: Adin Hector, MD  Location: Remote location  Delivery: TeleHealth     Virtual Encounter - Pain Management PROVIDER NOTE: Information contained herein reflects review and annotations entered in association with encounter. Interpretation of such information and data should be left to medically-trained personnel. Information provided to patient can be located elsewhere in the medical record under "Patient Instructions". Document created using STT-dictation technology, any transcriptional errors that may result from process are unintentional.    Contact & Pharmacy Preferred: [phone] Home: [phone] (home) Mobile: There is no such number on file (mobile). E-mail: [email or web address]  [place] [place] [address] [place] Phone: [phone] Fax: [phone]   Pre-screening  Tammy Deleon offered "in-person" vs "virtual" encounter. She indicated preferring virtual for this encounter.   Reason COVID-19*   Social distancing based on CDC and AMA recommendations.   I contacted Hardin Negus on 12/19/2021 via telephone.      I clearly identified myself as 01/01/2022, MD. I verified that I was speaking with the correct person using two identifiers (Name: Tammy Deleon, and date of birth: 08-Oct-1986).  Consent I sought verbal advanced consent from 07/13/1986 for virtual visit interactions. I informed Tammy Deleon of possible security and privacy concerns, risks, and limitations associated with providing "not-in-person" medical evaluation and management services. I also informed Tammy Deleon of the availability of "in-person"  appointments. Finally, I informed her that there would be a charge for the virtual visit and that she could be  personally, fully or partially, financially responsible for it. Tammy Deleon expressed understanding and agreed to proceed.   Historic Elements   Tammy Deleon is a 36 y.o. year old, female patient evaluated today after our last contact on 12/05/2021. Tammy Deleon  has a past medical history of Anxiety, Depression, Irregular intermenstrual bleeding, and UTI (lower urinary tract infection). She also  has a past surgical history that includes Gallbladder surgery; Hemi-microdiscectomy lumbar laminectomy level 1 (Right, 01/02/2020); and Hemi-microdiscectomy lumbar laminectomy level 1 (Right, 11/05/2020). Tammy Deleon has a current medication list which includes the following prescription(s): amphetamine-dextroamphetamine, hydrocodone-acetaminophen, ibuprofen, lamotrigine, levonorgestrel-ethinyl estradiol, phentermine, phentermine, trazodone, valacyclovir, amphetamine-dextroamphetamine, hydrocodone-acetaminophen, hydrocodone-acetaminophen, lamotrigine, levonorgestrel-ethinyl estradiol, phentermine, and trazodone. She  reports that she has never smoked. She has never used smokeless tobacco. She reports current alcohol use. She reports that she does not use drugs. Tammy Deleon is allergic to sulfa antibiotics.   HPI  Today, she is being contacted for a post-procedure assessment.  According to the patient she attained 100% relief of the pain that lasted from Thursday, 12/05/2021 until Tuesday, 12/09/2021.  After that, they relief went down to an ongoing 80% relief of the low back pain.  She refers still having some pain around the right PSIS area.  She is scheduled to return on 12/30/2021 for her medication refill.  At time we will reevaluate the area and if she is still having pain in that area, we will consider a trigger point injection.  The plan was shared with the patient who understood and  accepted.  Post-procedure evaluation  (Thursday 12/05/2021) Procedure:          Anesthesia, Analgesia, Anxiolysis:  Procedure #  1: Type: Medial Branch Facet Block #2 Primary Purpose: Diagnostic Region: Lumbar Level: L2, L3, L4, L5, & S1 Medial Branch Level(s) Target Area: For Lumbar Facet blocks, the target is the groove formed by the junction of the transverse process and superior articular process. For the L5 dorsal ramus, the target is the notch between superior articular process and sacral ala. For the S1 dorsal ramus, the target is the superior and lateral edge of the posterior S1 Sacral foramen. Approach: Posterior, paramedial, percutaneous approach. Laterality: Right  Procedure #2: Type: Sacroiliac Joint Block  #2  Primary Purpose: Diagnostic Region: Posterior Lumbosacral Level: PSIS (Posterior Superior Iliac Spine) Sacroiliac Joint Target Area: For upper sacroiliac joint block(s), the target is the superior and posterior margin of the sacroiliac joint. Approach: Ipsilateral approach. Laterality: Right  Anesthesia: Local (1-2% Lidocaine)  Anxiolysis: None  Sedation: None  Guidance: Fluoroscopy           Position: Prone   1. Lumbar facet joint syndrome (Right)   2. Spondylosis without myelopathy or radiculopathy, lumbosacral region   3. Lumbosacral facet arthropathy (L5-S1)   4. Chronic low back pain (1ry area of Pain) (Right) w/o sciatica   5. Chronic sacroiliac joint pain (Right)   6. Other spondylosis, sacral and sacrococcygeal region   7. Enthesopathy of sacroiliac joint (Right)   8. Somatic dysfunction of sacroiliac joint (Right)    NAS-11 Pain score:   Pre-procedure: 7 /10   Post-procedure: 3 /10      Effectiveness:  Initial hour after procedure: 100%. Subsequent 4-6 hours post-procedure: 100%. Analgesia past initial 6 hours: 80 % (pain better, but continues to have pressure at both hips). Ongoing improvement:  Analgesic: According to the patient she  attained 100% relief of the pain for the duration of the local anesthetic and in fact the procedure was performed on Thursday, 12/05/2021 and it was not until the next Tuesday, 12/09/2021 that the pain started coming back.  She refers that she still enjoying an ongoing 80% relief of the pain but she does have some pressure/pain over the right PSIS area. Function: Tammy Deleon reports improvement in function ROM: Tammy Deleon reports improvement in ROM  Pharmacotherapy Assessment   Opioid Analgesic: Hydrocodone/APAP 5/325, 1 tab p.o. 4 times daily (#56) (filled 02/27/2021) MME/day: 20 mg/day   Monitoring: [place] PMP: PDMP reviewed during this encounter.       Pharmacotherapy: No side-effects or adverse reactions reported. Compliance: No problems identified. Effectiveness: Clinically acceptable. Plan: Refer to "POC". UDS:  Summary  Date Value Ref Range Status  03/13/2021 Note  Final    Comment:    ==================================================================== Compliance Drug Analysis, Ur ==================================================================== Test                             Result       Flag       Units  Drug Present and Declared for Prescription Verification   Hydrocodone                    598          EXPECTED   ng/mg creat   Hydromorphone                  38           EXPECTED   ng/mg creat   Dihydrocodeine                 139  EXPECTED   ng/mg creat   Norhydrocodone                 504          EXPECTED   ng/mg creat    Sources of hydrocodone include scheduled prescription medications.    Hydromorphone, dihydrocodeine and norhydrocodone are expected    metabolites of hydrocodone. Hydromorphone and dihydrocodeine are    also available as scheduled prescription medications.    Lamotrigine                    PRESENT      EXPECTED   Trazodone                      PRESENT      EXPECTED   1,3 chlorophenyl piperazine    PRESENT      EXPECTED    1,3-chlorophenyl  piperazine is an expected metabolite of trazodone.    Acetaminophen                  PRESENT      EXPECTED   Ibuprofen                      PRESENT      EXPECTED  Drug Present not Declared for Prescription Verification   Tramadol                       >3759        UNEXPECTED ng/mg creat   O-Desmethyltramadol            >3759        UNEXPECTED ng/mg creat   N-Desmethyltramadol            1017         UNEXPECTED ng/mg creat    Source of tramadol is a prescription medication. O-desmethyltramadol    and N-desmethyltramadol are expected metabolites of tramadol.    Naproxen                       PRESENT      UNEXPECTED  Drug Absent but Declared for Prescription Verification   Amphetamine                    Not Detected UNEXPECTED ng/mg creat   Phentermine                    Not Detected UNEXPECTED ==================================================================== Test                      Result    Flag   Units      Ref Range   Creatinine              133              mg/dL      >=20 ==================================================================== Declared Medications:  The flagging and interpretation on this report are based on the  following declared medications.  Unexpected results may arise from  inaccuracies in the declared medications.   **Note: The testing scope of this panel includes these medications:   Amphetamine (Adderall)  Hydrocodone (Norco)  Lamotrigine (Lamictal)  Phentermine (Adipex)  Trazodone (Desyrel)   **Note: The testing scope of this panel does not include small to  moderate amounts of these reported medications:   Acetaminophen (Norco)  Ibuprofen (Advil)   **Note: The testing  scope of this panel does not include the  following reported medications:   Ethinyl Estradiol  Levonorgestrel  Valacyclovir (Valtrex) ==================================================================== For clinical consultation, please call [phone]. ====================================================================      Laboratory Chemistry Profile   Renal Lab Results  Component Value Date   BUN 15 03/13/2021   CREATININE 0.89 03/13/2021   LABCREA 208 09/21/2019   BCR 17 03/13/2021   GFRAA >60 12/30/2019   GFRNONAA >60 10/29/2020    Hepatic Lab Results  Component Value Date   AST 18 03/13/2021   ALT 11 09/22/2019   ALBUMIN 4.4 03/13/2021   ALKPHOS 67 03/13/2021   LIPASE 80 03/17/2012    Electrolytes Lab Results  Component Value Date   NA 139 03/13/2021   K 4.7 03/13/2021   CL 103 03/13/2021   CALCIUM 9.9 03/13/2021   MG 1.9 03/13/2021    Bone Lab Results  Component Value Date   25OHVITD1 50 03/13/2021   25OHVITD2 <1.0 03/13/2021   25OHVITD3 50 03/13/2021    Inflammation (CRP: Acute Phase) (ESR: Chronic Phase) Lab Results  Component Value Date   CRP 9 03/13/2021   ESRSEDRATE 14 03/13/2021         Note: Above Lab results reviewed.  Imaging  DG PAIN CLINIC C-ARM 1-60 MIN NO REPORT Fluoro was used, but no Radiologist interpretation will be provided.  Please refer to "NOTES" tab for provider progress note.  Assessment  The primary encounter diagnosis was Chronic low back pain (1ry area of Pain) (Right) w/o sciatica. Diagnoses of Lumbar facet joint syndrome (Right), Chronic sacroiliac joint pain (Right), and Trigger point with back pain (PSIS) (Right) were also pertinent to this visit.  Plan of Care  Problem-specific:  No problem-specific Assessment & Plan notes found for this encounter.  Tammy Deleon has a current medication list which includes the following long-term medication(s): amphetamine-dextroamphetamine, hydrocodone-acetaminophen, lamotrigine, levonorgestrel-ethinyl estradiol, phentermine, phentermine, trazodone, amphetamine-dextroamphetamine, hydrocodone-acetaminophen, hydrocodone-acetaminophen, lamotrigine, levonorgestrel-ethinyl estradiol, phentermine, and  trazodone.  Pharmacotherapy (Medications Ordered): No orders of the defined types were placed in this encounter.  Orders:  No orders of the defined types were placed in this encounter.  Follow-up plan:   Return for scheduled encounter for MM & (R) LB TPI #1 (Right PSIS).     Interventional Therapies  Risk   Complexity Considerations:   Estimated body mass index is 29.08 kg/m as calculated from the following:   Height as of this encounter: 5' 2"  (1.575 m).   Weight as of this encounter: 159 lb (72.1 kg). WNL   Planned   Pending:   Diagnostic right PSIS TPI #1    Under consideration:   Possible right lumbar facet RFA  Possible right SI joint RFA  Possible candidate for right thoracolumbar spinal cord stimulator trial/implant    Completed:   Diagnostic right SI Blk x2 (12/05/2021) (100/100 x5 days/80/80)  Diagnostic right caudal ESI x1 + diagnostic epidurogram (05/16/2021) (100/100/0/0)  Therapeutic right Racz procedure x1 (06/13/2021) (75/75/70 5/100% for the lower extremity pain but not the back) Diagnostic right lumbar facet MBB x2 (12/05/2021) (100/100 x5 days/80/80)    Completed by Dr. Sharlet Salina: Right L5-S1 TFESI x1 (12/07/2019)  Right S1 tTFESI x1 (12/07/2019)   Therapeutic   Palliative (PRN) options:   None established      Recent Visits Date Type Provider Dept  12/05/21 Procedure visit Milinda Pointer, MD [facility]  10/09/21 Office Visit Milinda Pointer, MD [facility]  Showing recent visits within past 90 days  and meeting all other requirements Today's Visits Date Type Provider Dept  12/19/21 Office Visit Milinda Pointer, MD [facility]  Showing today's visits and meeting all other requirements Future Appointments Date Type Provider Dept  12/30/21 Appointment Milinda Pointer, MD [facility]  Showing future appointments within next 90 days and meeting all other requirements  I discussed the assessment  and treatment plan with the patient. The patient was provided an opportunity to ask questions and all were answered. The patient agreed with the plan and demonstrated an understanding of the instructions.  Patient advised to call back or seek an in-person evaluation if the symptoms or condition worsens.  Duration of encounter: 18 minutes.  Note by: Gaspar Cola, MD Date: 12/19/2021; Time: 4:24 PM

## 2021-12-29 NOTE — Progress Notes (Signed)
PROVIDER NOTE: Information contained herein reflects review and annotations entered in association with encounter. Interpretation of such information and data should be left to medically-trained personnel. Information provided to patient can be located elsewhere in the medical record under "Patient Instructions". Document created using STT-dictation technology, any transcriptional errors that may result from process are unintentional.    Patient: Tammy Deleon  Service Category: E/M  Provider: Gaspar Cola, MD  DOB: 1986/07/19  DOS: 12/30/2021  Specialty: Interventional Pain Management  MRN: [id number]  Setting: Ambulatory outpatient  PCP: Adin Hector, MD  Type: Established Patient    Referring Provider: Adin Hector, MD  Location: Office  Delivery: Face-to-face     HPI  [name], a 36 y.o. year old female, is here today because of her Chronic pain syndrome [G89.4]. Tammy Deleon primary complain today is No chief complaint on file. Last encounter: My last encounter with her was on 12/19/2021. Pertinent problems: Tammy Deleon has Headache disorder; Lumbar disc disease; Chronic pain syndrome; Failed back surgical syndrome; Chronic low back pain (1ry area of Pain) (Right) w/o sciatica; Epidural fibrosis (S1) (Right); Lumbar facet joint syndrome (Right); Chronic hip pain (Right); Abnormal MRI, lumbar spine (02/04/2021); Lumbosacral facet arthropathy (L5-S1); Lumbosacral lateral recess stenosis (L5-S1) (Bilateral); History of lumbar laminectomy x2; Chronic sacroiliac joint pain (Right); Somatic dysfunction of sacroiliac joint (Right); Other spondylosis, sacral and sacrococcygeal region; Enthesopathy of sacroiliac joint (Right); Chronic lower extremity pain (intermittent) (2ry area of Pain) (Right); DDD (degenerative disc disease), lumbosacral; Spondylosis without myelopathy or radiculopathy, lumbosacral region; and Trigger point with back pain (PSIS) (Right) on their  pertinent problem list. Pain Assessment: Severity of Chronic pain is reported as a 6 /10. Location: Back Lower/radiates to right buttock. Onset: More than a month ago. Quality: Constant, Sharp, Pressure, Discomfort, Shooting. Timing: Intermittent. Modifying factor(s): procedures, meds. Vitals:  height is 5' 2"  (1.575 m) and weight is 150 lb (68 kg). Her temperature is 97.3 F (36.3 C) (abnormal). Her blood pressure is 145/99 (abnormal) and her pulse is 121 (abnormal). Her respiration is 16 and oxygen saturation is 100%.   Reason for encounter: medication management.   The patient indicates doing well with the current medication regimen. No adverse reactions or side effects reported to the medications.   UDS ordered today.   RTCB: 04/07/2022  Pharmacotherapy Assessment  Analgesic: Hydrocodone/APAP 5/325, 1 tab p.o. 4 times daily  MME/day: 20 mg/day   Monitoring: [place] PMP: PDMP reviewed during this encounter.       Pharmacotherapy: No side-effects or adverse reactions reported. Compliance: No problems identified. Effectiveness: Clinically acceptable.  Ignatius Specking, RN  12/30/2021 11:52 AM  Sign when Signing Visit Nursing Pain Medication Assessment:  Safety precautions to be maintained throughout the outpatient stay will include: orient to surroundings, keep bed in low position, maintain call bell within reach at all times, provide assistance with transfer out of bed and ambulation.  Medication Inspection Compliance: Pill count conducted under aseptic conditions, in front of the patient. Neither the pills nor the bottle was removed from the patient's sight at any time. Once count was completed pills were immediately returned to the patient in their original bottle.  Medication: Hydrocodone/APAP Pill/Patch Count:  22 of 100 pills remain Pill/Patch Appearance: Markings consistent with prescribed medication Bottle Appearance: Standard pharmacy container. Clearly labeled. Filled Date: 02 / 6 /  2023 Last Medication intake:  Today    UDS:  Summary  Date Value Ref Range Status  03/13/2021 Note  Final    Comment:    ==================================================================== Compliance Drug Analysis, Ur ==================================================================== Test                             Result       Flag       Units  Drug Present and Declared for Prescription Verification   Hydrocodone                    598          EXPECTED   ng/mg creat   Hydromorphone                  38           EXPECTED   ng/mg creat   Dihydrocodeine                 139          EXPECTED   ng/mg creat   Norhydrocodone                 504          EXPECTED   ng/mg creat    Sources of hydrocodone include scheduled prescription medications.    Hydromorphone, dihydrocodeine and norhydrocodone are expected    metabolites of hydrocodone. Hydromorphone and dihydrocodeine are    also available as scheduled prescription medications.    Lamotrigine                    PRESENT      EXPECTED   Trazodone                      PRESENT      EXPECTED   1,3 chlorophenyl piperazine    PRESENT      EXPECTED    1,3-chlorophenyl piperazine is an expected metabolite of trazodone.    Acetaminophen                  PRESENT      EXPECTED   Ibuprofen                      PRESENT      EXPECTED  Drug Present not Declared for Prescription Verification   Tramadol                       >3759        UNEXPECTED ng/mg creat   O-Desmethyltramadol            >3759        UNEXPECTED ng/mg creat   N-Desmethyltramadol            1017         UNEXPECTED ng/mg creat    Source of tramadol is a prescription medication. O-desmethyltramadol    and N-desmethyltramadol are expected metabolites of tramadol.    Naproxen                       PRESENT      UNEXPECTED  Drug Absent but Declared for Prescription Verification   Amphetamine                    Not Detected UNEXPECTED ng/mg creat   Phentermine  Not Detected UNEXPECTED ==================================================================== Test                      Result    Flag   Units      Ref Range   Creatinine              133              mg/dL      >=20 ==================================================================== Declared Medications:  The flagging and interpretation on this report are based on the  following declared medications.  Unexpected results may arise from  inaccuracies in the declared medications.   **Note: The testing scope of this panel includes these medications:   Amphetamine (Adderall)  Hydrocodone (Norco)  Lamotrigine (Lamictal)  Phentermine (Adipex)  Trazodone (Desyrel)   **Note: The testing scope of this panel does not include small to  moderate amounts of these reported medications:   Acetaminophen (Norco)  Ibuprofen (Advil)   **Note: The testing scope of this panel does not include the  following reported medications:   Ethinyl Estradiol  Levonorgestrel  Valacyclovir (Valtrex) ==================================================================== For clinical consultation, please call [phone]. ====================================================================      ROS  Constitutional: Denies any fever or chills Gastrointestinal: No reported hemesis, hematochezia, vomiting, or acute GI distress Musculoskeletal: Denies any acute onset joint swelling, redness, loss of ROM, or weakness Neurological: No reported episodes of acute onset apraxia, aphasia, dysarthria, agnosia, amnesia, paralysis, loss of coordination, or loss of consciousness  Medication Review  HYDROcodone-acetaminophen, Levonorgestrel-Ethinyl Estradiol, amphetamine-dextroamphetamine, ibuprofen, lamoTRIgine, phentermine, traZODone, and valACYclovir  History Review  Allergy: Tammy Deleon is allergic to sulfa antibiotics. Drug: Tammy Deleon  reports no history of drug use. Alcohol:  reports current alcohol  use. Tobacco:  reports that she has never smoked. She has never used smokeless tobacco. Social: Tammy Deleon  reports that she has never smoked. She has never used smokeless tobacco. She reports current alcohol use. She reports that she does not use drugs. Medical:  has a past medical history of Anxiety, Depression, Irregular intermenstrual bleeding, and UTI (lower urinary tract infection). Surgical: Tammy Deleon  has a past surgical history that includes Gallbladder surgery; Hemi-microdiscectomy lumbar laminectomy level 1 (Right, 01/02/2020); and Hemi-microdiscectomy lumbar laminectomy level 1 (Right, 11/05/2020). Family: family history is not on file.  Laboratory Chemistry Profile   Renal Lab Results  Component Value Date   BUN 15 03/13/2021   CREATININE 0.89 03/13/2021   LABCREA 208 09/21/2019   BCR 17 03/13/2021   GFRAA >60 12/30/2019   GFRNONAA >60 10/29/2020    Hepatic Lab Results  Component Value Date   AST 18 03/13/2021   ALT 11 09/22/2019   ALBUMIN 4.4 03/13/2021   ALKPHOS 67 03/13/2021   LIPASE 80 03/17/2012    Electrolytes Lab Results  Component Value Date   NA 139 03/13/2021   K 4.7 03/13/2021   CL 103 03/13/2021   CALCIUM 9.9 03/13/2021   MG 1.9 03/13/2021    Bone Lab Results  Component Value Date   25OHVITD1 50 03/13/2021   25OHVITD2 <1.0 03/13/2021   25OHVITD3 50 03/13/2021    Inflammation (CRP: Acute Phase) (ESR: Chronic Phase) Lab Results  Component Value Date   CRP 9 03/13/2021   ESRSEDRATE 14 03/13/2021         Note: Above Lab results reviewed.  Recent Imaging Review  DG PAIN CLINIC C-ARM 1-60 MIN NO REPORT Fluoro was used, but no Radiologist interpretation will be provided.  Please refer to "  NOTES" tab for provider progress note. Note: Reviewed        Physical Exam  General appearance: Well nourished, well developed, and well hydrated. In no apparent acute distress Mental status: Alert, oriented x 3 (person, place, & time)        Respiratory: No evidence of acute respiratory distress Eyes: PERLA Vitals: BP (!) 145/99    Pulse (!) 121    Temp (!) 97.3 F (36.3 C)    Resp 16    Ht 5' 2"  (1.575 m)    Wt 150 lb (68 kg)    SpO2 100%    BMI 27.44 kg/m  BMI: Estimated body mass index is 27.44 kg/m as calculated from the following:   Height as of this encounter: 5' 2"  (1.575 m).   Weight as of this encounter: 150 lb (68 kg). Ideal: Ideal body weight: 50.1 kg (110 lb 7.2 oz) Adjusted ideal body weight: 57.3 kg (126 lb 4.3 oz)  Assessment   Status Diagnosis  Controlled Controlled Controlled 1. Chronic pain syndrome   2. Chronic low back pain (1ry area of Pain) (Right) w/o sciatica   3. Chronic lower extremity pain (intermittent) (2ry area of Pain) (Right)   4. Failed back surgical syndrome   5. Epidural fibrosis (S1) (Right)   6. Lumbar facet joint syndrome (Right)   7. Chronic hip pain (Right)   8. Pharmacologic therapy   9. Chronic use of opiate for therapeutic purpose   10. Encounter for chronic pain management      Updated Problems: No problems updated.  Plan of Care  Problem-specific:  No problem-specific Assessment & Plan notes found for this encounter.  Tammy Deleon has a current medication list which includes the following long-term medication(s): amphetamine-dextroamphetamine, amphetamine-dextroamphetamine, [START ON 01/07/2022] hydrocodone-acetaminophen, [START ON 02/06/2022] hydrocodone-acetaminophen, [START ON 03/08/2022] hydrocodone-acetaminophen, lamotrigine, levonorgestrel-ethinyl estradiol, phentermine, trazodone, trazodone, levonorgestrel-ethinyl estradiol, phentermine, and phentermine.  Pharmacotherapy (Medications Ordered): Meds ordered this encounter  Medications   HYDROcodone-acetaminophen (NORCO/VICODIN) 5-325 MG tablet    Sig: Take 1 tablet by mouth every 6 (six) hours as needed for severe pain. Must last 30 days.    Dispense:  100 tablet    Refill:  0    DO NOT: delete (not  duplicate); no partial-fill (will deny script to complete), no refill request (F/U required). DISPENSE: 1 day early if closed on fill date. WARN: No CNS-depressants within 8 hrs of med.   HYDROcodone-acetaminophen (NORCO/VICODIN) 5-325 MG tablet    Sig: Take 1 tablet by mouth every 6 (six) hours as needed for severe pain. Must last 30 days.    Dispense:  100 tablet    Refill:  0    DO NOT: delete (not duplicate); no partial-fill (will deny script to complete), no refill request (F/U required). DISPENSE: 1 day early if closed on fill date. WARN: No CNS-depressants within 8 hrs of med.   HYDROcodone-acetaminophen (NORCO/VICODIN) 5-325 MG tablet    Sig: Take 1 tablet by mouth every 6 (six) hours as needed for severe pain. Must last 30 days.    Dispense:  100 tablet    Refill:  0    DO NOT: delete (not duplicate); no partial-fill (will deny script to complete), no refill request (F/U required). DISPENSE: 1 day early if closed on fill date. WARN: No CNS-depressants within 8 hrs of med.   Orders:  Orders Placed This Encounter  Procedures   ToxASSURE Select 13 (MW), Urine    Volume: 30 ml(s). Minimum 3 ml  of urine is needed. Document temperature of fresh sample. Indications: Long term (current) use of opiate analgesic (B52.481)    Order Specific Question:   Release to patient    Answer:   Immediate   Follow-up plan:   Return in about 14 weeks (around 04/07/2022) for Eval-day (M,W), (F2F), (MM).     Interventional Therapies  Risk   Complexity Considerations:   Estimated body mass index is 29.08 kg/m as calculated from the following:   Height as of this encounter: 5' 2"  (1.575 m).   Weight as of this encounter: 159 lb (72.1 kg). WNL   Planned   Pending:   Diagnostic right PSIS TPI #1    Under consideration:   Possible right lumbar facet RFA  Possible right SI joint RFA  Possible candidate for right thoracolumbar spinal cord stimulator trial/implant    Completed:   Diagnostic right SI  Blk x2 (12/05/2021) (100/100 x5 days/80/80)  Diagnostic right caudal ESI x1 + diagnostic epidurogram (05/16/2021) (100/100/0/0)  Therapeutic right Racz procedure x1 (06/13/2021) (75/75/70 5/100% for the lower extremity pain but not the back) Diagnostic right lumbar facet MBB x2 (12/05/2021) (100/100 x5 days/80/80)    Completed by Dr. Sharlet Salina: Right L5-S1 TFESI x1 (12/07/2019)  Right S1 tTFESI x1 (12/07/2019)   Therapeutic   Palliative (PRN) options:   None established    Recent Visits Date Type Provider Dept  12/19/21 Office Visit Milinda Pointer, MD [facility]  12/05/21 Procedure visit Milinda Pointer, MD [facility]  10/09/21 Office Visit Milinda Pointer, MD [facility]  Showing recent visits within past 90 days and meeting all other requirements Today's Visits Date Type Provider Dept  12/30/21 Office Visit Milinda Pointer, MD [facility]  Showing today's visits and meeting all other requirements Future Appointments No visits were found meeting these conditions. Showing future appointments within next 90 days and meeting all other requirements  I discussed the assessment and treatment plan with the patient. The patient was provided an opportunity to ask questions and all were answered. The patient agreed with the plan and demonstrated an understanding of the instructions.  Patient advised to call back or seek an in-person evaluation if the symptoms or condition worsens.  Duration of encounter: 30 minutes.  Note by: Gaspar Cola, MD Date: 12/30/2021; Time: 11:57 AM

## 2021-12-30 ENCOUNTER — Ambulatory Visit: Payer: 59 | Attending: Pain Medicine | Admitting: Pain Medicine

## 2021-12-30 ENCOUNTER — Other Ambulatory Visit: Payer: Self-pay

## 2021-12-30 VITALS — BP 145/99 | HR 121 | Temp 97.3°F | Resp 16 | Ht 62.0 in | Wt 150.0 lb

## 2021-12-30 DIAGNOSIS — M79604 Pain in right leg: Secondary | ICD-10-CM | POA: Diagnosis not present

## 2021-12-30 DIAGNOSIS — Z79899 Other long term (current) drug therapy: Secondary | ICD-10-CM | POA: Diagnosis not present

## 2021-12-30 DIAGNOSIS — Z79891 Long term (current) use of opiate analgesic: Secondary | ICD-10-CM

## 2021-12-30 DIAGNOSIS — M961 Postlaminectomy syndrome, not elsewhere classified: Secondary | ICD-10-CM

## 2021-12-30 DIAGNOSIS — G96198 Other disorders of meninges, not elsewhere classified: Secondary | ICD-10-CM | POA: Diagnosis not present

## 2021-12-30 DIAGNOSIS — M47816 Spondylosis without myelopathy or radiculopathy, lumbar region: Secondary | ICD-10-CM

## 2021-12-30 DIAGNOSIS — G894 Chronic pain syndrome: Secondary | ICD-10-CM

## 2021-12-30 DIAGNOSIS — G8929 Other chronic pain: Secondary | ICD-10-CM | POA: Diagnosis not present

## 2021-12-30 DIAGNOSIS — M545 Low back pain, unspecified: Secondary | ICD-10-CM

## 2021-12-30 DIAGNOSIS — M25551 Pain in right hip: Secondary | ICD-10-CM

## 2021-12-30 MED ORDER — HYDROCODONE-ACETAMINOPHEN 5-325 MG PO TABS
1.0000 | ORAL_TABLET | Freq: Four times a day (QID) | ORAL | 0 refills | Status: DC | PRN
  Filled 2022-01-07 – 2022-01-08 (×3): qty 100, 25d supply, fill #0
  Filled 2022-01-08: qty 100, 30d supply, fill #0

## 2021-12-30 MED ORDER — HYDROCODONE-ACETAMINOPHEN 5-325 MG PO TABS
1.0000 | ORAL_TABLET | Freq: Four times a day (QID) | ORAL | 0 refills | Status: DC | PRN
  Filled 2022-02-07 (×2): qty 100, 30d supply, fill #0
  Filled ????-??-??: fill #0

## 2021-12-30 MED ORDER — HYDROCODONE-ACETAMINOPHEN 5-325 MG PO TABS
1.0000 | ORAL_TABLET | Freq: Four times a day (QID) | ORAL | 0 refills | Status: DC | PRN
  Filled 2022-03-09: qty 100, 30d supply, fill #0
  Filled 2022-03-10: qty 100, 25d supply, fill #0

## 2021-12-30 NOTE — Patient Instructions (Signed)
____________________________________________________________________________________________  Medication Rules  Purpose: To inform patients, and their family members, of our rules and regulations.  Applies to: All patients receiving prescriptions (written or electronic).  Pharmacy of record: Pharmacy where electronic prescriptions will be sent. If written prescriptions are taken to a different pharmacy, please inform the nursing staff. The pharmacy listed in the electronic medical record should be the one where you would like electronic prescriptions to be sent.  Electronic prescriptions: In compliance with the [place] (STOP) Act of 2017 (Session Lanny Cramp [phone]), effective November 03, 2018, all controlled substances must be electronically prescribed. Calling prescriptions to the pharmacy will cease to exist.  Prescription refills: Only during scheduled appointments. Applies to all prescriptions.  NOTE: The following applies primarily to controlled substances (Opioid* Pain Medications).   Type of encounter (visit): For patients receiving controlled substances, face-to-face visits are required. (Not an option or up to the patient.)  Patient's responsibilities: Pain Pills: Bring all pain pills to every appointment (except for procedure appointments). Pill Bottles: Bring pills in original pharmacy bottle. Always bring the newest bottle. Bring bottle, even if empty. Medication refills: You are responsible for knowing and keeping track of what medications you take and those you need refilled. The day before your appointment: write a list of all prescriptions that need to be refilled. The day of the appointment: give the list to the admitting nurse. Prescriptions will be written only during appointments. No prescriptions will be written on procedure days. If you forget a medication: it will not be "Called in", "Faxed", or "electronically sent". You will  need to get another appointment to get these prescribed. No early refills. Do not call asking to have your prescription filled early. Prescription Accuracy: You are responsible for carefully inspecting your prescriptions before leaving our office. Have the discharge nurse carefully go over each prescription with you, before taking them home. Make sure that your name is accurately spelled, that your address is correct. Check the name and dose of your medication to make sure it is accurate. Check the number of pills, and the written instructions to make sure they are clear and accurate. Make sure that you are given enough medication to last until your next medication refill appointment. Taking Medication: Take medication as prescribed. When it comes to controlled substances, taking less pills or less frequently than prescribed is permitted and encouraged. Never take more pills than instructed. Never take medication more frequently than prescribed.  Inform other Doctors: Always inform, all of your healthcare providers, of all the medications you take. Pain Medication from other Providers: You are not allowed to accept any additional pain medication from any other Doctor or Healthcare provider. There are two exceptions to this rule. (see below) In the event that you require additional pain medication, you are responsible for notifying us, as stated below. Cough Medicine: Often these contain an opioid, such as codeine or hydrocodone. Never accept or take cough medicine containing these opioids if you are already taking an opioid* medication. The combination may cause respiratory failure and death. Medication Agreement: You are responsible for carefully reading and following our Medication Agreement. This must be signed before receiving any prescriptions from our practice. Safely store a copy of your signed Agreement. Violations to the Agreement will result in no further prescriptions. (Additional copies of our  Medication Agreement are available upon request.) Laws, Rules, & Regulations: All patients are expected to follow all Federal and [employer], [employer], Rules, [employer] [facility]. Ignorance of  the Laws does not constitute a valid excuse.  Illegal drugs and Controlled Substances: The use of illegal substances (including, but not limited to marijuana and its derivatives) and/or the illegal use of any controlled substances is strictly prohibited. Violation of this rule may result in the immediate and permanent discontinuation of any and all prescriptions being written by our practice. The use of any illegal substances is prohibited. Adopted CDC guidelines & recommendations: Target dosing levels will be at or below 60 MME/day. Use of benzodiazepines** is not recommended.  Exceptions: There are only two exceptions to the rule of not receiving pain medications from other Healthcare Providers. Exception #1 (Emergencies): In the event of an emergency (i.e.: accident requiring emergency care), you are allowed to receive additional pain medication. However, you are responsible for: As soon as you are able, call our office [phone], at any time of the day or night, and leave a message stating your name, the date and nature of the emergency, and the name and dose of the medication prescribed. In the event that your call is answered by a member of our staff, make sure to document and save the date, time, and the name of the person that took your information.  Exception #2 (Planned Surgery): In the event that you are scheduled by another doctor or dentist to have any type of surgery or procedure, you are allowed (for a period no longer than 30 days), to receive additional pain medication, for the acute post-op pain. However, in this case, you are responsible for picking up a copy of our "Post-op Pain Management for Surgeons" handout, and giving it to your surgeon or dentist. This document is available at our office, and  does not require an appointment to obtain it. Simply go to our office during business hours (Monday-Thursday from 8:00 AM to 4:00 PM) (Friday 8:00 AM to 12:00 Noon) or if you have a scheduled appointment with [place], prior to your surgery, and ask for it by name. In addition, you are responsible for: calling our office [phone], at any time of the day or night, and leaving a message stating your name, name of your surgeon, type of surgery, and date of procedure or surgery. Failure to comply with your responsibilities may result in termination of therapy involving the controlled substances. Medication Agreement Violation. Following the above rules, including your responsibilities will help you in avoiding a Medication Agreement Violation (Breaking your Pain Medication Contract).  *Opioid medications include: morphine, codeine, oxycodone, oxymorphone, hydrocodone, hydromorphone, meperidine, tramadol, tapentadol, buprenorphine, fentanyl, methadone. **Benzodiazepine medications include: diazepam (Valium), alprazolam (Xanax), clonazepam (Klonopine), lorazepam (Ativan), clorazepate (Tranxene), chlordiazepoxide (Librium), estazolam (Prosom), oxazepam (Serax), temazepam (Restoril), triazolam (Halcion) (Last updated: 07/31/2021) ____________________________________________________________________________________________  ____________________________________________________________________________________________  Medication Recommendations and Reminders  Applies to: All patients receiving prescriptions (written and/or electronic).  Medication Rules & Regulations: These rules and regulations exist for your safety and that of others. They are not flexible and neither are we. Dismissing or ignoring them will be considered "non-compliance" with medication therapy, resulting in complete and irreversible termination of such therapy. (See document titled "Medication Rules" for more details.) In all conscience,  because of safety reasons, we cannot continue providing a therapy where the patient does not follow instructions.  Pharmacy of record:  Definition: This is the pharmacy where your electronic prescriptions will be sent.  We do not endorse any particular pharmacy, however, we have experienced problems with [facility] not securing enough medication supply for the community. We do not restrict you  in your choice of pharmacy. However, once we write for your prescriptions, we will NOT be re-sending more prescriptions to fix restricted supply problems created by your pharmacy, or your insurance.  The pharmacy listed in the electronic medical record should be the one where you want electronic prescriptions to be sent. If you choose to change pharmacy, simply notify our nursing staff.  Recommendations: Keep all of your pain medications in a safe place, under lock and key, even if you live alone. We will NOT replace lost, stolen, or damaged medication. After you fill your prescription, take 1 week's worth of pills and put them away in a safe place. You should keep a separate, properly labeled bottle for this purpose. The remainder should be kept in the original bottle. Use this as your primary supply, until it runs out. Once it's gone, then you know that you have 1 week's worth of medicine, and it is time to come in for a prescription refill. If you do this correctly, it is unlikely that you will ever run out of medicine. To make sure that the above recommendation works, it is very important that you make sure your medication refill appointments are scheduled at least 1 week before you run out of medicine. To do this in an effective manner, make sure that you do not leave the office without scheduling your next medication management appointment. Always ask the nursing staff to show you in your prescription , when your medication will be running out. Then arrange for the receptionist to get you a return appointment,  at least 7 days before you run out of medicine. Do not wait until you have 1 or 2 pills left, to come in. This is very poor planning and does not take into consideration that we may need to cancel appointments due to bad weather, sickness, or emergencies affecting our staff. DO NOT ACCEPT A "Partial Fill": If for any reason your pharmacy does not have enough pills/tablets to completely fill or refill your prescription, do not allow for a "partial fill". The law allows the pharmacy to complete that prescription within 72 hours, without requiring a new prescription. If they do not fill the rest of your prescription within those 72 hours, you will need a separate prescription to fill the remaining amount, which we will NOT provide. If the reason for the partial fill is your insurance, you will need to talk to the pharmacist about payment alternatives for the remaining tablets, but again, DO NOT ACCEPT A PARTIAL FILL, unless you can trust your pharmacist to obtain the remainder of the pills within 72 hours.  Prescription refills and/or changes in medication(s):  Prescription refills, and/or changes in dose or medication, will be conducted only during scheduled medication management appointments. (Applies to both, written and electronic prescriptions.) No refills on procedure days. No medication will be changed or started on procedure days. No changes, adjustments, and/or refills will be conducted on a procedure day. Doing so will interfere with the diagnostic portion of the procedure. No phone refills. No medications will be "called into the pharmacy". No Fax refills. No weekend refills. No Holliday refills. No after hours refills.  Remember:  Business hours are:  Monday to Thursday 8:00 AM to 4:00 PM Provider's Schedule: Milinda Pointer, MD - Appointments are:  Medication management: Monday and Wednesday 8:00 AM to 4:00 PM Procedure day: Tuesday and Thursday 7:30 AM to 4:00 PM Gillis Santa, MD -  Appointments are:  Medication management: Tuesday and Thursday 8:00  AM to 4:00 PM Procedure day: Monday and Wednesday 7:30 AM to 4:00 PM (Last update: 05/23/2020) ____________________________________________________________________________________________  ____________________________________________________________________________________________  CBD (cannabidiol) & Delta-8 (Delta-8 tetrahydrocannabinol) WARNING  Intro: Cannabidiol (CBD) and tetrahydrocannabinol (THC), are two natural compounds found in plants of the Cannabis genus. They can both be extracted from hemp or cannabis. Hemp and cannabis come from the Cannabis sativa plant. Both compounds interact with your bodys endocannabinoid system, but they have very different effects. CBD does not produce the high sensation associated with cannabis. Delta-8 tetrahydrocannabinol, also known as delta-8 THC, is a psychoactive substance found in the Cannabis sativa plant, of which marijuana and hemp are two varieties. THC is responsible for the high associated with the illicit use of marijuana.  Applicable to: All individuals currently taking or considering taking CBD (cannabidiol) and, more important, all patients taking opioid analgesic controlled substances (pain medication). (Example: oxycodone; oxymorphone; hydrocodone; hydromorphone; morphine; methadone; tramadol; tapentadol; fentanyl; buprenorphine; butorphanol; dextromethorphan; meperidine; codeine; etc.)  Legal status: CBD remains a Schedule I drug prohibited for any use. CBD is illegal with one exception. In the [place], CBD has a limited Transport planner (FDA) approval for the treatment of two specific types of epilepsy disorders. Only one CBD product has been approved by the FDA for this purpose: "Epidiolex". FDA is aware that some companies are marketing products containing cannabis and cannabis-derived compounds in ways that violate the [employer], Drug and Cosmetic Act  [facility] Act) and that may put the health and safety of consumers at risk. The FDA, a Federal agency, has not enforced the CBD status since 2018.   Legality: Some manufacturers ship CBD products nationally, which is illegal. Often such products are sold online and are therefore available throughout the country. CBD is openly sold in head shops and health food stores in some states where such sales have not been explicitly legalized. Selling unapproved products with unsubstantiated therapeutic claims is not only a violation of the law, but also can put patients at risk, as these products have not been proven to be safe or effective. Federal illegality makes it difficult to conduct research on CBD.  Reference: "FDA Regulation of Cannabis and Cannabis-Derived Products, Including Cannabidiol (CBD)" - [employer]  Warning: CBD is not FDA approved and has not undergo the same manufacturing controls as prescription drugs.  This means that the purity and safety of available CBD may be questionable. Most of the time, despite manufacturer's claims, it is contaminated with THC (delta-9-tetrahydrocannabinol - the chemical in marijuana responsible for the "HIGH").  When this is the case, the [facility] contaminant will trigger a positive urine drug screen (UDS) test for Marijuana (carboxy-THC). Because a positive UDS for any illicit substance is a violation of our medication agreement, your opioid analgesics (pain medicine) may be permanently discontinued. The FDA recently put out a warning about 5 things that everyone should be aware of regarding Delta-8 THC: Delta-8 THC products have not been evaluated or approved by the FDA for safe use and may be marketed in ways that put the public health at risk. The FDA has received adverse event reports involving delta-8 THC-containing products. Delta-8 THC has  psychoactive and intoxicating effects. Delta-8 THC manufacturing often involve use of potentially harmful chemicals to create the concentrations of delta-8 THC claimed in the marketplace. The final delta-8 THC product may have potentially harmful by-products (contaminants) due to the chemicals used in the process. Manufacturing of delta-8 THC products may occur in uncontrolled or unsanitary settings, which may  lead to the presence of unsafe contaminants or other potentially harmful substances. Delta-8 THC products should be kept out of the reach of children and pets.  MORE ABOUT CBD  General Information: CBD was discovered in [birth year] and it is a derivative of the cannabis sativa genus plants (Marijuana and Hemp). It is one of the 113 identified substances found in Marijuana. It accounts for up to 40% of the plant's extract. As of 2018, preliminary clinical studies on CBD included research for the treatment of anxiety, movement disorders, and pain. CBD is available and consumed in multiple forms, including inhalation of smoke or vapor, as an aerosol spray, and by mouth. It may be supplied as an oil containing CBD, capsules, dried cannabis, or as a liquid solution. CBD is thought not to be as psychoactive as THC (delta-9-tetrahydrocannabinol - the chemical in marijuana responsible for the "HIGH"). Studies suggest that CBD may interact with different biological target receptors in the body, including cannabinoid and other neurotransmitter receptors. As of 2018 the mechanism of action for its biological effects has not been determined.  Side-effects   Adverse reactions: Dry mouth, diarrhea, decreased appetite, fatigue, drowsiness, malaise, weakness, sleep disturbances, and others.  Drug interactions: CBC may interact with other medications such as blood-thinners. (Last update: 08/02/2021) ____________________________________________________________________________________________   ____________________________________________________________________________________________  Drug Holidays (Slow)  What is a "Drug Holiday"? Drug Holiday: is the name given to the period of time during which a patient stops taking a medication(s) for the purpose of eliminating tolerance to the drug.  Benefits Improved effectiveness of opioids. Decreased opioid dose needed to achieve benefits. Improved pain with lesser dose.  What is tolerance? Tolerance: is the progressive decreased in effectiveness of a drug due to its repetitive use. With repetitive use, the body gets use to the medication and as a consequence, it loses its effectiveness. This is a common problem seen with opioid pain medications. As a result, a larger dose of the drug is needed to achieve the same effect that used to be obtained with a smaller dose.  How long should a "Drug Holiday" last? You should stay off of the pain medicine for at least 14 consecutive days. (2 weeks)  Should I stop the medicine "cold [place]"? No. You should always coordinate with your Pain Specialist so that he/she can provide you with the correct medication dose to make the transition as smoothly as possible.  How do I stop the medicine? Slowly. You will be instructed to decrease the daily amount of pills that you take by one (1) pill every seven (7) days. This is called a "slow downward taper" of your dose. For example: if you normally take four (4) pills per day, you will be asked to drop this dose to three (3) pills per day for seven (7) days, then to two (2) pills per day for seven (7) days, then to one (1) per day for seven (7) days, and at the end of those last seven (7) days, this is when the "Drug Holiday" would start.   Will I have withdrawals? By doing a "slow downward taper" like this one, it is unlikely that you will experience any significant withdrawal symptoms. Typically, what triggers withdrawals is the sudden stop of a high dose  opioid therapy. Withdrawals can usually be avoided by slowly decreasing the dose over a prolonged period of time. If you do not follow these instructions and decide to stop your medication abruptly, withdrawals may be possible.  What are withdrawals? Withdrawals: refers  to the wide range of symptoms that occur after stopping or dramatically reducing opiate drugs after heavy and prolonged use. Withdrawal symptoms do not occur to patients that use low dose opioids, or those who take the medication sporadically. Contrary to benzodiazepine (example: Valium, Xanax, etc.) or alcohol withdrawals (Delirium Tremens), opioid withdrawals are not lethal. Withdrawals are the physical manifestation of the body getting rid of the excess receptors.  Expected Symptoms Early symptoms of withdrawal may include: Agitation Anxiety Muscle aches Increased tearing Insomnia Runny nose Sweating Yawning  Late symptoms of withdrawal may include: Abdominal cramping Diarrhea Dilated pupils Goose bumps Nausea Vomiting  Will I experience withdrawals? Due to the slow nature of the taper, it is very unlikely that you will experience any.  What is a slow taper? Taper: refers to the gradual decrease in dose.  (Last update: 05/23/2020) ____________________________________________________________________________________________

## 2021-12-30 NOTE — Progress Notes (Signed)
Nursing Pain Medication Assessment:  Safety precautions to be maintained throughout the outpatient stay will include: orient to surroundings, keep bed in low position, maintain call bell within reach at all times, provide assistance with transfer out of bed and ambulation.  Medication Inspection Compliance: Pill count conducted under aseptic conditions, in front of the patient. Neither the pills nor the bottle was removed from the patient's sight at any time. Once count was completed pills were immediately returned to the patient in their original bottle.  Medication: Hydrocodone/APAP Pill/Patch Count:  22 of 100 pills remain Pill/Patch Appearance: Markings consistent with prescribed medication Bottle Appearance: Standard pharmacy container. Clearly labeled. Filled Date: 02 / 6 / 2023 Last Medication intake:  Today

## 2022-01-02 LAB — TOXASSURE SELECT 13 (MW), URINE

## 2022-01-07 ENCOUNTER — Other Ambulatory Visit: Payer: Self-pay

## 2022-01-08 ENCOUNTER — Other Ambulatory Visit: Payer: Self-pay

## 2022-01-10 ENCOUNTER — Other Ambulatory Visit: Payer: Self-pay

## 2022-01-15 ENCOUNTER — Other Ambulatory Visit: Payer: Self-pay

## 2022-01-15 DIAGNOSIS — E663 Overweight: Secondary | ICD-10-CM | POA: Diagnosis not present

## 2022-01-15 MED ORDER — IBUPROFEN 800 MG PO TABS
ORAL_TABLET | Freq: Three times a day (TID) | ORAL | 11 refills | Status: AC | PRN
Start: 2022-01-15 — End: 2023-01-15
  Filled 2022-01-15: qty 30, 10d supply, fill #0
  Filled 2022-02-05: qty 30, 10d supply, fill #1
  Filled 2022-02-25: qty 30, 10d supply, fill #2
  Filled 2022-03-10: qty 30, 10d supply, fill #3
  Filled 2022-03-25: qty 30, 10d supply, fill #4
  Filled 2022-04-07: qty 30, 10d supply, fill #5
  Filled 2022-05-08: qty 30, 10d supply, fill #6
  Filled 2022-06-27 – 2022-11-04 (×6): qty 30, 10d supply, fill #7
  Filled 2022-12-16: qty 30, 10d supply, fill #8

## 2022-01-16 ENCOUNTER — Other Ambulatory Visit: Payer: Self-pay

## 2022-01-17 ENCOUNTER — Other Ambulatory Visit: Payer: Self-pay

## 2022-02-05 ENCOUNTER — Other Ambulatory Visit: Payer: Self-pay

## 2022-02-07 ENCOUNTER — Other Ambulatory Visit: Payer: Self-pay

## 2022-02-10 ENCOUNTER — Other Ambulatory Visit: Payer: Self-pay

## 2022-02-12 ENCOUNTER — Other Ambulatory Visit: Payer: Self-pay

## 2022-02-12 MED ORDER — AMPHETAMINE-DEXTROAMPHETAMINE 30 MG PO TABS: 1.0000 | ORAL_TABLET | Freq: Two times a day (BID) | ORAL | 0 refills | Status: DC

## 2022-02-25 ENCOUNTER — Other Ambulatory Visit: Payer: Self-pay

## 2022-03-10 ENCOUNTER — Other Ambulatory Visit: Payer: Self-pay

## 2022-03-25 ENCOUNTER — Other Ambulatory Visit: Payer: Self-pay

## 2022-03-31 NOTE — Progress Notes (Unsigned)
PROVIDER NOTE: Information contained herein reflects review and annotations entered in association with encounter. Interpretation of such information and data should be left to medically-trained personnel. Information provided to patient can be located elsewhere in the medical record under "Patient Instructions". Document created using STT-dictation technology, any transcriptional errors that may result from process are unintentional.    Patient: Tammy Deleon  Service Category: E/M  Provider: Gaspar Cola, MD  DOB: 24-Apr-1986  DOS: 04/02/2022  Specialty: Interventional Pain Management  MRN: [id number]  Setting: Ambulatory outpatient  PCP: Adin Hector, MD  Type: Established Patient    Referring Provider: Adin Hector, MD  Location: Office  Delivery: Face-to-face     HPI  [name], a 36 y.o. year old female, is here today because of her No primary diagnosis found.. Ms. Varnadore's primary complain today is No chief complaint on file. Last encounter: My last encounter with her was on 12/30/2021. Pertinent problems: Ms. Szuch has Headache disorder; Lumbar disc disease; Chronic pain syndrome; Failed back surgical syndrome; Chronic low back pain (1ry area of Pain) (Right) w/o sciatica; Epidural fibrosis (S1) (Right); Lumbar facet joint syndrome (Right); Chronic hip pain (Right); Abnormal MRI, lumbar spine (02/04/2021); Lumbosacral facet arthropathy (L5-S1); Lumbosacral lateral recess stenosis (L5-S1) (Bilateral); History of lumbar laminectomy x2; Chronic sacroiliac joint pain (Right); Somatic dysfunction of sacroiliac joint (Right); Other spondylosis, sacral and sacrococcygeal region; Enthesopathy of sacroiliac joint (Right); Chronic lower extremity pain (intermittent) (2ry area of Pain) (Right); DDD (degenerative disc disease), lumbosacral; Spondylosis without myelopathy or radiculopathy, lumbosacral region; and Trigger point with back pain (PSIS) (Right) on their pertinent  problem list. Pain Assessment: Severity of   is reported as a  /10. Location:    / . Onset:  . Quality:  . Timing:  . Modifying factor(s):  Marland Kitchen Vitals:  vitals were not taken for this visit.   Reason for encounter:  *** . ***  Pharmacotherapy Assessment  Analgesic: Hydrocodone/APAP 5/325, 1 tab p.o. 4 times daily  MME/day: 20 mg/day   Monitoring: [place] PMP: PDMP reviewed during this encounter.       Pharmacotherapy: No side-effects or adverse reactions reported. Compliance: No problems identified. Effectiveness: Clinically acceptable.  No notes on file  UDS:  Summary  Date Value Ref Range Status  12/30/2021 Note  Final    Comment:    ==================================================================== [employer] [facility] (MW) ==================================================================== Test                             Result       Flag       Units  Drug Present and Declared for Prescription Verification   Hydrocodone                    4300         EXPECTED   ng/mg creat   Hydromorphone                  83           EXPECTED   ng/mg creat   Dihydrocodeine                 301          EXPECTED   ng/mg creat   Norhydrocodone                 2142         EXPECTED  ng/mg creat    Sources of hydrocodone include scheduled prescription medications.    Hydromorphone, dihydrocodeine and norhydrocodone are expected    metabolites of hydrocodone. Hydromorphone and dihydrocodeine are    also available as scheduled prescription medications.  Drug Present not Declared for Prescription Verification   Fentanyl                       8            UNEXPECTED ng/mg creat   Norfentanyl                    142          UNEXPECTED ng/mg creat    Source of fentanyl is a scheduled prescription medication, including    IV, patch, and transmucosal formulations. Norfentanyl is an expected    metabolite of fentanyl.  Drug Absent but Declared for Prescription Verification   Amphetamine                     Not Detected UNEXPECTED ng/mg creat ==================================================================== Test                      Result    Flag   Units      Ref Range   Creatinine              232              mg/dL      >=20 ==================================================================== Declared Medications:  The flagging and interpretation on this report are based on the  following declared medications.  Unexpected results may arise from  inaccuracies in the declared medications.   **Note: The testing scope of this panel includes these medications:   Amphetamine (Adderall)  Hydrocodone (Norco)   **Note: The testing scope of this panel does not include the  following reported medications:   Acetaminophen (Norco)  Ethinyl Estradiol  Ibuprofen (Advil)  Lamotrigine (Lamictal)  Levonorgestrel  Phentermine (Adipex)  Trazodone (Desyrel)  Valacyclovir (Valtrex) ==================================================================== For clinical consultation, please call [phone]. ====================================================================      ROS  Constitutional: Denies any fever or chills Gastrointestinal: No reported hemesis, hematochezia, vomiting, or acute GI distress Musculoskeletal: Denies any acute onset joint swelling, redness, loss of ROM, or weakness Neurological: No reported episodes of acute onset apraxia, aphasia, dysarthria, agnosia, amnesia, paralysis, loss of coordination, or loss of consciousness  Medication Review  HYDROcodone-acetaminophen, Levonorgestrel-Ethinyl Estradiol, amphetamine-dextroamphetamine, ibuprofen, lamoTRIgine, phentermine, and traZODone  History Review  Allergy: Ms. Eunice is allergic to sulfa antibiotics. Drug: Ms. Blatchley  reports no history of drug use. Alcohol:  reports current alcohol use. Tobacco:  reports that she has never smoked. She has never used smokeless tobacco. Social: Ms. Hukill  reports that  she has never smoked. She has never used smokeless tobacco. She reports current alcohol use. She reports that she does not use drugs. Medical:  has a past medical history of Anxiety, Depression, Irregular intermenstrual bleeding, and UTI (lower urinary tract infection). Surgical: Ms. Riendeau  has a past surgical history that includes Gallbladder surgery; Hemi-microdiscectomy lumbar laminectomy level 1 (Right, 01/02/2020); and Hemi-microdiscectomy lumbar laminectomy level 1 (Right, 11/05/2020). Family: family history is not on file.  Laboratory Chemistry Profile   Renal Lab Results  Component Value Date   BUN 15 03/13/2021   CREATININE 0.89 03/13/2021   LABCREA 208 09/21/2019   BCR 17 03/13/2021   GFRAA >60 12/30/2019   GFRNONAA >  60 10/29/2020    Hepatic Lab Results  Component Value Date   AST 18 03/13/2021   ALT 11 09/22/2019   ALBUMIN 4.4 03/13/2021   ALKPHOS 67 03/13/2021   LIPASE 80 03/17/2012    Electrolytes Lab Results  Component Value Date   NA 139 03/13/2021   K 4.7 03/13/2021   CL 103 03/13/2021   CALCIUM 9.9 03/13/2021   MG 1.9 03/13/2021    Bone Lab Results  Component Value Date   25OHVITD1 50 03/13/2021   25OHVITD2 <1.0 03/13/2021   25OHVITD3 50 03/13/2021    Inflammation (CRP: Acute Phase) (ESR: Chronic Phase) Lab Results  Component Value Date   CRP 9 03/13/2021   ESRSEDRATE 14 03/13/2021         Note: Above Lab results reviewed.  Recent Imaging Review  DG PAIN CLINIC C-ARM 1-60 MIN NO REPORT Fluoro was used, but no Radiologist interpretation will be provided.  Please refer to "NOTES" tab for provider progress note. Note: Reviewed        Physical Exam  General appearance: Well nourished, well developed, and well hydrated. In no apparent acute distress Mental status: Alert, oriented x 3 (person, place, & time)       Respiratory: No evidence of acute respiratory distress Eyes: PERLA Vitals: There were no vitals taken for this visit. BMI: Estimated  body mass index is 27.44 kg/m as calculated from the following:   Height as of 12/30/21: _0  (1.575 m).   Weight as of 12/30/21: 150 lb (68 kg). Ideal: Patient weight not recorded  Assessment   Diagnosis Status  No diagnosis found. Controlled Controlled Controlled   Updated Problems: No problems updated.  Plan of Care  Problem-specific:  No problem-specific Assessment & Plan notes found for this encounter.  Ms. MILES BORKOWSKI has a current medication list which includes the following long-term medication(s): amphetamine-dextroamphetamine, amphetamine-dextroamphetamine, hydrocodone-acetaminophen, hydrocodone-acetaminophen, hydrocodone-acetaminophen, lamotrigine, levonorgestrel-ethinyl estradiol, levonorgestrel-ethinyl estradiol, phentermine, phentermine, phentermine, trazodone, and trazodone.  Pharmacotherapy (Medications Ordered): No orders of the defined types were placed in this encounter.  Orders:  No orders of the defined types were placed in this encounter.  Follow-up plan:   No follow-ups on file.     Interventional Therapies  Risk  Complexity Considerations:   Estimated body mass index is 29.08 kg/m as calculated from the following:   Height as of this encounter: _1  (1.575 m).   Weight as of this encounter: 159 lb (72.1 kg). WNL   Planned  Pending:   Diagnostic right PSIS TPI #1    Under consideration:   Possible right lumbar facet RFA  Possible right SI joint RFA  Possible candidate for right thoracolumbar spinal cord stimulator trial/implant    Completed:   Diagnostic right SI Blk x2 (12/05/2021) (100/100 x5 days/80/80)  Diagnostic right caudal ESI x1 + diagnostic epidurogram (05/16/2021) (100/100/0/0)  Therapeutic right Racz procedure x1 (06/13/2021) (75/75/70 5/100% for the lower extremity pain but not the back) Diagnostic right lumbar facet MBB x2 (12/05/2021) (100/100 x5 days/80/80)    Completed by Dr. Sharlet Salina: Right L5-S1 TFESI x1  (12/07/2019)  Right S1 tTFESI x1 (12/07/2019)   Therapeutic  Palliative (PRN) options:   None established     Recent Visits No visits were found meeting these conditions. Showing recent visits within past 90 days and meeting all other requirements Future Appointments Date Type Provider Dept  04/02/22 Appointment Milinda Pointer, MD [facility]  Showing future appointments within next 90 days and meeting all other  requirements  I discussed the assessment and treatment plan with the patient. The patient was provided an opportunity to ask questions and all were answered. The patient agreed with the plan and demonstrated an understanding of the instructions.  Patient advised to call back or seek an in-person evaluation if the symptoms or condition worsens.  Duration of encounter: *** minutes.  Note by: Gaspar Cola, MD Date: 04/02/2022; Time: 3:16 PM

## 2022-04-02 ENCOUNTER — Encounter: Payer: Self-pay | Admitting: Pain Medicine

## 2022-04-02 ENCOUNTER — Ambulatory Visit
Admission: RE | Admit: 2022-04-02 | Discharge: 2022-04-02 | Disposition: A | Discharge status: Home / Self Care | Payer: 59 | Source: Ambulatory Visit | Attending: Pain Medicine | Admitting: Pain Medicine

## 2022-04-02 ENCOUNTER — Other Ambulatory Visit: Payer: Self-pay

## 2022-04-02 ENCOUNTER — Ambulatory Visit (HOSPITAL_BASED_OUTPATIENT_CLINIC_OR_DEPARTMENT_OTHER): Payer: 59 | Admitting: Pain Medicine

## 2022-04-02 VITALS — BP 138/72 | HR 78 | Temp 98.8°F | Resp 16 | Ht 62.0 in | Wt 150.0 lb

## 2022-04-02 DIAGNOSIS — M25552 Pain in left hip: Secondary | ICD-10-CM | POA: Insufficient documentation

## 2022-04-02 DIAGNOSIS — M533 Sacrococcygeal disorders, not elsewhere classified: Secondary | ICD-10-CM | POA: Insufficient documentation

## 2022-04-02 DIAGNOSIS — M545 Low back pain, unspecified: Secondary | ICD-10-CM | POA: Diagnosis not present

## 2022-04-02 DIAGNOSIS — G8929 Other chronic pain: Secondary | ICD-10-CM | POA: Insufficient documentation

## 2022-04-02 DIAGNOSIS — G96198 Other disorders of meninges, not elsewhere classified: Secondary | ICD-10-CM | POA: Insufficient documentation

## 2022-04-02 DIAGNOSIS — M79604 Pain in right leg: Secondary | ICD-10-CM | POA: Insufficient documentation

## 2022-04-02 DIAGNOSIS — M961 Postlaminectomy syndrome, not elsewhere classified: Secondary | ICD-10-CM | POA: Diagnosis not present

## 2022-04-02 DIAGNOSIS — G894 Chronic pain syndrome: Secondary | ICD-10-CM

## 2022-04-02 DIAGNOSIS — Z79891 Long term (current) use of opiate analgesic: Secondary | ICD-10-CM

## 2022-04-02 DIAGNOSIS — Z79899 Other long term (current) drug therapy: Secondary | ICD-10-CM

## 2022-04-02 DIAGNOSIS — M25551 Pain in right hip: Secondary | ICD-10-CM | POA: Insufficient documentation

## 2022-04-02 DIAGNOSIS — M47816 Spondylosis without myelopathy or radiculopathy, lumbar region: Secondary | ICD-10-CM | POA: Diagnosis not present

## 2022-04-02 MED ORDER — HYDROCODONE-ACETAMINOPHEN 5-325 MG PO TABS
1.0000 | ORAL_TABLET | Freq: Four times a day (QID) | ORAL | 0 refills | Status: DC | PRN
  Filled 2022-04-07: qty 100, 25d supply, fill #0
  Filled 2022-04-07 – 2022-04-09 (×2): qty 100, 30d supply, fill #0

## 2022-04-02 NOTE — Patient Instructions (Addendum)
______________________________________________________________________  Preparing for Procedure with Sedation  NOTICE: Due to recent regulatory changes, starting on June 03, 2021, procedures requiring intravenous (IV) sedation will no longer be performed at the [facility].  These types of procedures are required to be performed at [facility] ambulatory surgery facility.  We are very sorry for the inconvenience.  Procedure appointments are limited to planned procedures: No Prescription Refills. No disability issues will be discussed. No medication changes will be discussed.  Instructions: Oral Intake: Do not eat or drink anything for at least 8 hours prior to your procedure. (Exception: Blood Pressure Medication. See below.) Transportation: A driver is required. You may not drive yourself after the procedure. Blood Pressure Medicine: Do not forget to take your blood pressure medicine with a sip of water the morning of the procedure. If your Diastolic (lower reading) is above 100 mmHg, elective cases will be cancelled/rescheduled. Blood thinners: These will need to be stopped for procedures. Notify our staff if you are taking any blood thinners. Depending on which one you take, there will be specific instructions on how and when to stop it. Diabetics on insulin: Notify the staff so that you can be scheduled 1st case in the morning. If your diabetes requires high dose insulin, take only  of your normal insulin dose the morning of the procedure and notify the staff that you have done so. Preventing infections: Shower with an antibacterial soap the morning of your procedure. Build-up your immune system: Take 1000 mg of Vitamin C with every meal (3 times a day) the day prior to your procedure. Antibiotics: Inform the staff if you have a condition or reason that requires you to take antibiotics before dental procedures. Pregnancy: If you are pregnant, call and cancel the procedure. Sickness: If  you have a cold, fever, or any active infections, call and cancel the procedure. Arrival: You must be in the facility at least 30 minutes prior to your scheduled procedure. Children: Do not bring children with you. Dress appropriately: There is always the possibility that your clothing may get soiled. Valuables: Do not bring any jewelry or valuables.  Reasons to call and reschedule or cancel your procedure: (Following these recommendations will minimize the risk of a serious complication.) Surgeries: Avoid having procedures within 2 weeks of any surgery. (Avoid for 2 weeks before or after any surgery). Flu Shots: Avoid having procedures within 2 weeks of a flu shots. (Avoid for 2 weeks before or after immunizations). Barium: Avoid having a procedure within 7-10 days after having had a radiological study involving the use of radiological contrast. (Myelograms, Barium swallow or enema study). Heart attacks: Avoid any elective procedures or surgeries for the initial 6 months after a "Myocardial Infarction" (Heart Attack). Blood thinners: It is imperative that you stop these medications before procedures. Let us know if you if you take any blood thinner.  Infection: Avoid procedures during or within two weeks of an infection (including chest colds or gastrointestinal problems). Symptoms associated with infections include: Localized redness, fever, chills, night sweats or profuse sweating, burning sensation when voiding, cough, congestion, stuffiness, runny nose, sore throat, diarrhea, nausea, vomiting, cold or Flu symptoms, recent or current infections. It is specially important if the infection is over the area that we intend to treat. Heart and lung problems: Symptoms that may suggest an active cardiopulmonary problem include: cough, chest pain, breathing difficulties or shortness of breath, dizziness, ankle swelling, uncontrolled high or unusually low blood pressure, and/or palpitations. If you are  experiencing any of these symptoms, cancel your procedure and contact your primary care physician for an evaluation.  Remember:  Regular Business hours are:  Monday to Thursday 8:00 AM to 4:00 PM  Provider's Schedule: Milinda Pointer, MD:  Procedure days: Tuesday and Thursday 7:30 AM to 4:00 PM  Gillis Santa, MD:  Procedure days: Monday and Wednesday 7:30 AM to 4:00 PM ______________________________________________________________________  ____________________________________________________________________________________________  General Risks and Possible Complications  Patient Responsibilities: It is important that you read this as it is part of your informed consent. It is our duty to inform you of the risks and possible complications associated with treatments offered to you. It is your responsibility as a patient to read this and to ask questions about anything that is not clear or that you believe was not covered in this document.  Patient's Rights: You have the right to refuse treatment. You also have the right to change your mind, even after initially having agreed to have the treatment done. However, under this last option, if you wait until the last second to change your mind, you may be charged for the materials used up to that point.  Introduction: Medicine is not an Chief Strategy Officer. Everything in Medicine, including the lack of treatment(s), carries the potential for danger, harm, or loss (which is by definition: Risk). In Medicine, a complication is a secondary problem, condition, or disease that can aggravate an already existing one. All treatments carry the risk of possible complications. The fact that a side effects or complications occurs, does not imply that the treatment was conducted incorrectly. It must be clearly understood that these can happen even when everything is done following the highest safety standards.  No treatment: You can choose not to proceed with the  proposed treatment alternative. The "PRO(s)" would include: avoiding the risk of complications associated with the therapy. The "CON(s)" would include: not getting any of the treatment benefits. These benefits fall under one of three categories: diagnostic; therapeutic; and/or palliative. Diagnostic benefits include: getting information which can ultimately lead to improvement of the disease or symptom(s). Therapeutic benefits are those associated with the successful treatment of the disease. Finally, palliative benefits are those related to the decrease of the primary symptoms, without necessarily curing the condition (example: decreasing the pain from a flare-up of a chronic condition, such as incurable terminal cancer).  General Risks and Complications: These are associated to most interventional treatments. They can occur alone, or in combination. They fall under one of the following six (6) categories: no benefit or worsening of symptoms; bleeding; infection; nerve damage; allergic reactions; and/or death. No benefits or worsening of symptoms: In Medicine there are no guarantees, only probabilities. No healthcare provider can ever guarantee that a medical treatment will work, they can only state the probability that it may. Furthermore, there is always the possibility that the condition may worsen, either directly, or indirectly, as a consequence of the treatment. Bleeding: This is more common if the patient is taking a blood thinner, either prescription or over the counter (example: Goody Powders, Fish oil, Aspirin, Garlic, etc.), or if suffering a condition associated with impaired coagulation (example: Hemophilia, cirrhosis of the liver, low platelet counts, etc.). However, even if you do not have one on these, it can still happen. If you have any of these conditions, or take one of these drugs, make sure to notify your treating physician. Infection: This is more common in patients with a compromised  immune system, either due to disease (example:  diabetes, cancer, human immunodeficiency virus [HIV], etc.), or due to medications or treatments (example: therapies used to treat cancer and rheumatological diseases). However, even if you do not have one on these, it can still happen. If you have any of these conditions, or take one of these drugs, make sure to notify your treating physician. Nerve Damage: This is more common when the treatment is an invasive one, but it can also happen with the use of medications, such as those used in the treatment of cancer. The damage can occur to small secondary nerves, or to large primary ones, such as those in the spinal cord and brain. This damage may be temporary or permanent and it may lead to impairments that can range from temporary numbness to permanent paralysis and/or brain death. Allergic Reactions: Any time a substance or material comes in contact with our body, there is the possibility of an allergic reaction. These can range from a mild skin rash (contact dermatitis) to a severe systemic reaction (anaphylactic reaction), which can result in death. Death: In general, any medical intervention can result in death, most of the time due to an unforeseen complication. ____________________________________________________________________________________________ ____________________________________________________________________________________________  Medication Rules  Purpose: To inform patients, and their family members, of our rules and regulations.  Applies to: All patients receiving prescriptions (written or electronic).  Pharmacy of record: Pharmacy where electronic prescriptions will be sent. If written prescriptions are taken to a different pharmacy, please inform the nursing staff. The pharmacy listed in the electronic medical record should be the one where you would like electronic prescriptions to be sent.  Electronic prescriptions: In compliance  with the [place] (STOP) Act of 2017 (Session Lanny Cramp [phone]), effective November 03, 2018, all controlled substances must be electronically prescribed. Calling prescriptions to the pharmacy will cease to exist.  Prescription refills: Only during scheduled appointments. Applies to all prescriptions.  NOTE: The following applies primarily to controlled substances (Opioid* Pain Medications).   Type of encounter (visit): For patients receiving controlled substances, face-to-face visits are required. (Not an option or up to the patient.)  Patient's responsibilities: Pain Pills: Bring all pain pills to every appointment (except for procedure appointments). Pill Bottles: Bring pills in original pharmacy bottle. Always bring the newest bottle. Bring bottle, even if empty. Medication refills: You are responsible for knowing and keeping track of what medications you take and those you need refilled. The day before your appointment: write a list of all prescriptions that need to be refilled. The day of the appointment: give the list to the admitting nurse. Prescriptions will be written only during appointments. No prescriptions will be written on procedure days. If you forget a medication: it will not be "Called in", "Faxed", or "electronically sent". You will need to get another appointment to get these prescribed. No early refills. Do not call asking to have your prescription filled early. Prescription Accuracy: You are responsible for carefully inspecting your prescriptions before leaving our office. Have the discharge nurse carefully go over each prescription with you, before taking them home. Make sure that your name is accurately spelled, that your address is correct. Check the name and dose of your medication to make sure it is accurate. Check the number of pills, and the written instructions to make sure they are clear and accurate. Make sure that you are given  enough medication to last until your next medication refill appointment. Taking Medication: Take medication as prescribed. When it comes to controlled substances, taking less  pills or less frequently than prescribed is permitted and encouraged. Never take more pills than instructed. Never take medication more frequently than prescribed.  Inform other Doctors: Always inform, all of your healthcare providers, of all the medications you take. Pain Medication from other Providers: You are not allowed to accept any additional pain medication from any other Doctor or Healthcare provider. There are two exceptions to this rule. (see below) In the event that you require additional pain medication, you are responsible for notifying us, as stated below. Cough Medicine: Often these contain an opioid, such as codeine or hydrocodone. Never accept or take cough medicine containing these opioids if you are already taking an opioid* medication. The combination may cause respiratory failure and death. Medication Agreement: You are responsible for carefully reading and following our Medication Agreement. This must be signed before receiving any prescriptions from our practice. Safely store a copy of your signed Agreement. Violations to the Agreement will result in no further prescriptions. (Additional copies of our Medication Agreement are available upon request.) Laws, Rules, & Regulations: All patients are expected to follow all Federal and [employer], [employer], Rules, [employer]. Ignorance of the Laws does not constitute a valid excuse.  Illegal drugs and Controlled Substances: The use of illegal substances (including, but not limited to marijuana and its derivatives) and/or the illegal use of any controlled substances is strictly prohibited. Violation of this rule may result in the immediate and permanent discontinuation of any and all prescriptions being written by our practice. The use of any illegal substances is  prohibited. Adopted CDC guidelines & recommendations: Target dosing levels will be at or below 60 MME/day. Use of benzodiazepines** is not recommended.  Exceptions: There are only two exceptions to the rule of not receiving pain medications from other Healthcare Providers. Exception #1 (Emergencies): In the event of an emergency (i.e.: accident requiring emergency care), you are allowed to receive additional pain medication. However, you are responsible for: As soon as you are able, call our office [phone], at any time of the day or night, and leave a message stating your name, the date and nature of the emergency, and the name and dose of the medication prescribed. In the event that your call is answered by a member of our staff, make sure to document and save the date, time, and the name of the person that took your information.  Exception #2 (Planned Surgery): In the event that you are scheduled by another doctor or dentist to have any type of surgery or procedure, you are allowed (for a period no longer than 30 days), to receive additional pain medication, for the acute post-op pain. However, in this case, you are responsible for picking up a copy of our "Post-op Pain Management for Surgeons" handout, and giving it to your surgeon or dentist. This document is available at our office, and does not require an appointment to obtain it. Simply go to our office during business hours (Monday-Thursday from 8:00 AM to 4:00 PM) (Friday 8:00 AM to 12:00 Noon) or if you have a scheduled appointment with [place], prior to your surgery, and ask for it by name. In addition, you are responsible for: calling our office [phone], at any time of the day or night, and leaving a message stating your name, name of your surgeon, type of surgery, and date of procedure or surgery. Failure to comply with your responsibilities may result in termination of therapy involving the controlled substances. Medication Agreement  Violation.  Following the above rules, including your responsibilities will help you in avoiding a Medication Agreement Violation ("Breaking your Pain Medication Contract").  *Opioid medications include: morphine, codeine, oxycodone, oxymorphone, hydrocodone, hydromorphone, meperidine, tramadol, tapentadol, buprenorphine, fentanyl, methadone. **Benzodiazepine medications include: diazepam (Valium), alprazolam (Xanax), clonazepam (Klonopine), lorazepam (Ativan), clorazepate (Tranxene), chlordiazepoxide (Librium), estazolam (Prosom), oxazepam (Serax), temazepam (Restoril), triazolam (Halcion) (Last updated: 07/31/2021) ____________________________________________________________________________________________  ____________________________________________________________________________________________  Medication Recommendations and Reminders  Applies to: All patients receiving prescriptions (written and/or electronic).  Medication Rules & Regulations: These rules and regulations exist for your safety and that of others. They are not flexible and neither are we. Dismissing or ignoring them will be considered "non-compliance" with medication therapy, resulting in complete and irreversible termination of such therapy. (See document titled "Medication Rules" for more details.) In all conscience, because of safety reasons, we cannot continue providing a therapy where the patient does not follow instructions.  Pharmacy of record:  Definition: This is the pharmacy where your electronic prescriptions will be sent.  We do not endorse any particular pharmacy, however, we have experienced problems with [facility] not securing enough medication supply for the community. We do not restrict you in your choice of pharmacy. However, once we write for your prescriptions, we will NOT be re-sending more prescriptions to fix restricted supply problems created by your pharmacy, or your insurance.  The pharmacy listed in  the electronic medical record should be the one where you want electronic prescriptions to be sent. If you choose to change pharmacy, simply notify our nursing staff.  Recommendations: Keep all of your pain medications in a safe place, under lock and key, even if you live alone. We will NOT replace lost, stolen, or damaged medication. After you fill your prescription, take 1 week's worth of pills and put them away in a safe place. You should keep a separate, properly labeled bottle for this purpose. The remainder should be kept in the original bottle. Use this as your primary supply, until it runs out. Once it's gone, then you know that you have 1 week's worth of medicine, and it is time to come in for a prescription refill. If you do this correctly, it is unlikely that you will ever run out of medicine. To make sure that the above recommendation works, it is very important that you make sure your medication refill appointments are scheduled at least 1 week before you run out of medicine. To do this in an effective manner, make sure that you do not leave the office without scheduling your next medication management appointment. Always ask the nursing staff to show you in your prescription , when your medication will be running out. Then arrange for the receptionist to get you a return appointment, at least 7 days before you run out of medicine. Do not wait until you have 1 or 2 pills left, to come in. This is very poor planning and does not take into consideration that we may need to cancel appointments due to bad weather, sickness, or emergencies affecting our staff. DO NOT ACCEPT A "Partial Fill": If for any reason your pharmacy does not have enough pills/tablets to completely fill or refill your prescription, do not allow for a "partial fill". The law allows the pharmacy to complete that prescription within 72 hours, without requiring a new prescription. If they do not fill the rest of your prescription  within those 72 hours, you will need a separate prescription to fill the remaining amount, which we will NOT provide. If  the reason for the partial fill is your insurance, you will need to talk to the pharmacist about payment alternatives for the remaining tablets, but again, DO NOT ACCEPT A PARTIAL FILL, unless you can trust your pharmacist to obtain the remainder of the pills within 72 hours.  Prescription refills and/or changes in medication(s):  Prescription refills, and/or changes in dose or medication, will be conducted only during scheduled medication management appointments. (Applies to both, written and electronic prescriptions.) No refills on procedure days. No medication will be changed or started on procedure days. No changes, adjustments, and/or refills will be conducted on a procedure day. Doing so will interfere with the diagnostic portion of the procedure. No phone refills. No medications will be "called into the pharmacy". No Fax refills. No weekend refills. No Holliday refills. No after hours refills.  Remember:  Business hours are:  Monday to Thursday 8:00 AM to 4:00 PM Provider's Schedule: Milinda Pointer, MD - Appointments are:  Medication management: Monday and Wednesday 8:00 AM to 4:00 PM Procedure day: Tuesday and Thursday 7:30 AM to 4:00 PM Gillis Santa, MD - Appointments are:  Medication management: Tuesday and Thursday 8:00 AM to 4:00 PM Procedure day: Monday and Wednesday 7:30 AM to 4:00 PM (Last update: 05/23/2020) ____________________________________________________________________________________________  ____________________________________________________________________________________________  CBD (cannabidiol) & Delta-8 (Delta-8 tetrahydrocannabinol) WARNING  Intro: Cannabidiol (CBD) and tetrahydrocannabinol (THC), are two natural compounds found in plants of the Cannabis genus. They can both be extracted from hemp or cannabis. Hemp and cannabis come  from the Cannabis sativa plant. Both compounds interact with your body's endocannabinoid system, but they have very different effects. CBD does not produce the high sensation associated with cannabis. Delta-8 tetrahydrocannabinol, also known as delta-8 THC, is a psychoactive substance found in the Cannabis sativa plant, of which marijuana and hemp are two varieties. THC is responsible for the high associated with the illicit use of marijuana.  Applicable to: All individuals currently taking or considering taking CBD (cannabidiol) and, more important, all patients taking opioid analgesic controlled substances (pain medication). (Example: oxycodone; oxymorphone; hydrocodone; hydromorphone; morphine; methadone; tramadol; tapentadol; fentanyl; buprenorphine; butorphanol; dextromethorphan; meperidine; codeine; etc.)  Legal status: CBD remains a Schedule I drug prohibited for any use. CBD is illegal with one exception. In the [place], CBD has a limited Transport planner (FDA) approval for the treatment of two specific types of epilepsy disorders. Only one CBD product has been approved by the FDA for this purpose: "Epidiolex". FDA is aware that some companies are marketing products containing cannabis and cannabis-derived compounds in ways that violate the [employer], Drug and Cosmetic Act [facility] Act) and that may put the health and safety of consumers at risk. The FDA, a Federal agency, has not enforced the CBD status since 2018. UPDATE: (12/20/2021) The Drug Enforcement Agency ([place]) issued a letter stating that "delta" cannabinoids, including Delta-8-THCO and Delta-9-THCO, synthetically derived from hemp do not qualify as hemp and will be viewed as Schedule I drugs. (Schedule I drugs, substances, or chemicals are defined as drugs with no currently accepted medical use and a high potential for abuse. Some examples of Schedule I drugs are: heroin, lysergic acid diethylamide (LSD), marijuana  (cannabis), 3,4-methylenedioxymethamphetamine (ecstasy), methaqualone, and peyote.) (https://jennings.com/)  Legality: Some manufacturers ship CBD products nationally, which is illegal. Often such products are sold online and are therefore available throughout the country. CBD is openly sold in head shops and health food stores in some states where such sales have not been explicitly legalized. Selling  unapproved products with unsubstantiated therapeutic claims is not only a violation of the law, but also can put patients at risk, as these products have not been proven to be safe or effective. Federal illegality makes it difficult to conduct research on CBD.  Reference: "FDA Regulation of Cannabis and Cannabis-Derived Products, Including Cannabidiol (CBD)" - [email or web address]  Warning: CBD is not FDA approved and has not undergo the same manufacturing controls as prescription drugs.  This means that the purity and safety of available CBD may be questionable. Most of the time, despite manufacturer's claims, it is contaminated with THC (delta-9-tetrahydrocannabinol - the chemical in marijuana responsible for the "HIGH").  When this is the case, the [facility] contaminant will trigger a positive urine drug screen (UDS) test for Marijuana (carboxy-THC). Because a positive UDS for any illicit substance is a violation of our medication agreement, your opioid analgesics (pain medicine) may be permanently discontinued. The FDA recently put out a warning about 5 things that everyone should be aware of regarding Delta-8 THC: Delta-8 THC products have not been evaluated or approved by the FDA for safe use and may be marketed in ways that put the public health at risk. The FDA has received adverse event reports involving delta-8 THC-containing products. Delta-8 THC has psychoactive and intoxicating  effects. Delta-8 THC manufacturing often involve use of potentially harmful chemicals to create the concentrations of delta-8 THC claimed in the marketplace. The final delta-8 THC product may have potentially harmful by-products (contaminants) due to the chemicals used in the process. Manufacturing of delta-8 THC products may occur in uncontrolled or unsanitary settings, which may lead to the presence of unsafe contaminants or other potentially harmful substances. Delta-8 THC products should be kept out of the reach of children and pets.  MORE ABOUT CBD  General Information: CBD was discovered in [birth year] and it is a derivative of the cannabis sativa genus plants (Marijuana and Hemp). It is one of the 113 identified substances found in Marijuana. It accounts for up to 40% of the plant's extract. As of 2018, preliminary clinical studies on CBD included research for the treatment of anxiety, movement disorders, and pain. CBD is available and consumed in multiple forms, including inhalation of smoke or vapor, as an aerosol spray, and by mouth. It may be supplied as an oil containing CBD, capsules, dried cannabis, or as a liquid solution. CBD is thought not to be as psychoactive as THC (delta-9-tetrahydrocannabinol - the chemical in marijuana responsible for the "HIGH"). Studies suggest that CBD may interact with different biological target receptors in the body, including cannabinoid and other neurotransmitter receptors. As of 2018 the mechanism of action for its biological effects has not been determined.  Side-effects  Adverse reactions: Dry mouth, diarrhea, decreased appetite, fatigue, drowsiness, malaise, weakness, sleep disturbances, and others.  Drug interactions: CBC may interact with other medications such as blood-thinners. Because CBD causes drowsiness on its own, it also increases the drowsiness caused by other medications, including antihistamines (such as Benadryl), benzodiazepines (Xanax, Ativan,  Valium), antipsychotics, antidepressants and opioids, as well as alcohol and supplements such as kava, melatonin and St. John's Wort. Be cautious with the following combinations:   Brivaracetam (Briviact) Brivaracetam is changed and broken down by the body. CBD might decrease how quickly the body breaks down brivaracetam. This might increase levels of brivaracetam in the body.  Caffeine Caffeine is changed and broken down by the body. CBD might decrease how quickly the body breaks down caffeine. This might increase levels of caffeine  in the body.  Carbamazepine (Tegretol) Carbamazepine is changed and broken down by the body. CBD might decrease how quickly the body breaks down carbamazepine. This might increase levels of carbamazepine in the body and increase its side effects.  Citalopram (Celexa) Citalopram is changed and broken down by the body. CBD might decrease how quickly the body breaks down citalopram. This might increase levels of citalopram in the body and increase its side effects.  Clobazam (Onfi) Clobazam is changed and broken down by the liver. CBD might decrease how quickly the liver breaks down clobazam. This might increase the effects and side effects of clobazam.  Eslicarbazepine (Aptiom) Eslicarbazepine is changed and broken down by the body. CBD might decrease how quickly the body breaks down eslicarbazepine. This might increase levels of eslicarbazepine in the body by a small amount.  Everolimus (Zostress) Everolimus is changed and broken down by the body. CBD might decrease how quickly the body breaks down everolimus. This might increase levels of everolimus in the body.  Lithium Taking higher doses of CBD might increase levels of lithium. This can increase the risk of lithium toxicity.  Medications changed by the liver (Cytochrome P450 1A1 (CYP1A1) substrates) Some medications are changed and broken down by the liver. CBD might change how quickly the liver breaks down  these medications. This could change the effects and side effects of these medications.  Medications changed by the liver (Cytochrome P450 1A2 (CYP1A2) substrates) Some medications are changed and broken down by the liver. CBD might change how quickly the liver breaks down these medications. This could change the effects and side effects of these medications.  Medications changed by the liver (Cytochrome P450 1B1 (CYP1B1) substrates) Some medications are changed and broken down by the liver. CBD might change how quickly the liver breaks down these medications. This could change the effects and side effects of these medications.  Medications changed by the liver (Cytochrome P450 2A6 (CYP2A6) substrates) Some medications are changed and broken down by the liver. CBD might change how quickly the liver breaks down these medications. This could change the effects and side effects of these medications.  Medications changed by the liver (Cytochrome P450 2B6 (CYP2B6) substrates) Some medications are changed and broken down by the liver. CBD might change how quickly the liver breaks down these medications. This could change the effects and side effects of these medications.  Medications changed by the liver (Cytochrome P450 2C19 (CYP2C19) substrates) Some medications are changed and broken down by the liver. CBD might change how quickly the liver breaks down these medications. This could change the effects and side effects of these medications.  Medications changed by the liver (Cytochrome P450 2C8 (CYP2C8) substrates) Some medications are changed and broken down by the liver. CBD might change how quickly the liver breaks down these medications. This could change the effects and side effects of these medications.  Medications changed by the liver (Cytochrome P450 2C9 (CYP2C9) substrates) Some medications are changed and broken down by the liver. CBD might change how quickly the liver breaks down these  medications. This could change the effects and side effects of these medications.  Medications changed by the liver (Cytochrome P450 2D6 (CYP2D6) substrates) Some medications are changed and broken down by the liver. CBD might change how quickly the liver breaks down these medications. This could change the effects and side effects of these medications.  Medications changed by the liver (Cytochrome P450 2E1 (CYP2E1) substrates) Some medications are changed and broken down  by the liver. CBD might change how quickly the liver breaks down these medications. This could change the effects and side effects of these medications.  Medications changed by the liver (Cytochrome P450 3A4 (CYP3A4) substrates) Some medications are changed and broken down by the liver. CBD might change how quickly the liver breaks down these medications. This could change the effects and side effects of these medications.  Medications changed by the liver (Glucuronidated drugs) Some medications are changed and broken down by the liver. CBD might change how quickly the liver breaks down these medications. This could change the effects and side effects of these medications.  Medications that decrease the breakdown of other medications by the liver (Cytochrome P450 2C19 (CYP2C19) inhibitors) CBD is changed and broken down by the liver. Some drugs decrease how quickly the liver changes and breaks down CBD. This could change the effects and side effects of CBD.  Medications that decrease the breakdown of other medications in the liver (Cytochrome P450 3A4 (CYP3A4) inhibitors) CBD is changed and broken down by the liver. Some drugs decrease how quickly the liver changes and breaks down CBD. This could change the effects and side effects of CBD.  Medications that increase breakdown of other medications by the liver (Cytochrome P450 3A4 (CYP3A4) inducers) CBD is changed and broken down by the liver. Some drugs increase how quickly the  liver changes and breaks down CBD. This could change the effects and side effects of CBD.  Medications that increase the breakdown of other medications by the liver (Cytochrome P450 2C19 (CYP2C19) inducers) CBD is changed and broken down by the liver. Some drugs increase how quickly the liver changes and breaks down CBD. This could change the effects and side effects of CBD.  Methadone (Dolophine) Methadone is broken down by the liver. CBD might decrease how quickly the liver breaks down methadone. Taking cannabidiol along with methadone might increase the effects and side effects of methadone.  Rufinamide (Banzel) Rufinamide is changed and broken down by the body. CBD might decrease how quickly the body breaks down rufinamide. This might increase levels of rufinamide in the body by a small amount.  Sedative medications (CNS depressants) CBD might cause sleepiness and slowed breathing. Some medications, called sedatives, can also cause sleepiness and slowed breathing. Taking CBD with sedative medications might cause breathing problems and/or too much sleepiness.  Sirolimus (Rapamune) Sirolimus is changed and broken down by the body. CBD might decrease how quickly the body breaks down sirolimus. This might increase levels of sirolimus in the body.  Stiripentol (Diacomit) Stiripentol is changed and broken down by the body. CBD might decrease how quickly the body breaks down stiripentol. This might increase levels of stiripentol in the body and increase its side effects.  Tacrolimus (Prograf) Tacrolimus is changed and broken down by the body. CBD might decrease how quickly the body breaks down tacrolimus. This might increase levels of tacrolimus in the body.  Tamoxifen (Soltamox) Tamoxifen is changed and broken down by the body. CBD might affect how quickly the body breaks down tamoxifen. This might affect levels of tamoxifen in the body.  Topiramate (Topamax) Topiramate is changed and broken  down by the body. CBD might decrease how quickly the body breaks down topiramate. This might increase levels of topiramate in the body by a small amount.  Valproate Valproic acid can cause liver injury. Taking cannabidiol with valproic acid might increase the chance of liver injury. CBD and/or valproic acid might need to be stopped, or  the dose might need to be reduced.  Warfarin (Coumadin) CBD might increase levels of warfarin, which can increase the risk for bleeding. CBD and/or warfarin might need to be stopped, or the dose might need to be reduced.  Zonisamide Zonisamide is changed and broken down by the body. CBD might decrease how quickly the body breaks down zonisamide. This might increase levels of zonisamide in the body by a small amount. (Last update: 01/01/2022) ____________________________________________________________________________________________

## 2022-04-02 NOTE — Progress Notes (Signed)
Nursing Pain Medication Assessment:  Safety precautions to be maintained throughout the outpatient stay will include: orient to surroundings, keep bed in low position, maintain call bell within reach at all times, provide assistance with transfer out of bed and ambulation.  Medication Inspection Compliance: Pill count conducted under aseptic conditions, in front of the patient. Neither the pills nor the bottle was removed from the patient's sight at any time. Once count was completed pills were immediately returned to the patient in their original bottle.  Medication: Hydrocodone/APAP Pill/Patch Count:  9 of 100 pills remain Pill/Patch Appearance: Markings consistent with prescribed medication Bottle Appearance: Standard pharmacy container. Clearly labeled. Filled Date: 05 / 08 / 2023 Last Medication intake:  Today

## 2022-04-07 ENCOUNTER — Other Ambulatory Visit: Payer: Self-pay

## 2022-04-07 LAB — TOXASSURE SELECT 13 (MW), URINE

## 2022-04-09 ENCOUNTER — Other Ambulatory Visit: Payer: Self-pay

## 2022-04-15 ENCOUNTER — Ambulatory Visit: Payer: 59 | Admitting: Pain Medicine

## 2022-04-23 DIAGNOSIS — E663 Overweight: Secondary | ICD-10-CM | POA: Diagnosis not present

## 2022-04-28 ENCOUNTER — Ambulatory Visit (HOSPITAL_BASED_OUTPATIENT_CLINIC_OR_DEPARTMENT_OTHER): Payer: 59 | Admitting: Pain Medicine

## 2022-04-28 ENCOUNTER — Other Ambulatory Visit: Payer: Self-pay

## 2022-04-28 DIAGNOSIS — Z91199 Patient's noncompliance with other medical treatment and regimen due to unspecified reason: Secondary | ICD-10-CM

## 2022-04-28 MED ORDER — AMPHETAMINE-DEXTROAMPHETAMINE 30 MG PO TABS
1.0000 | ORAL_TABLET | Freq: Two times a day (BID) | ORAL | 0 refills | Status: AC
  Filled 2022-04-28 – 2022-05-20 (×3): qty 180, 90d supply, fill #0

## 2022-04-29 ENCOUNTER — Ambulatory Visit
Admission: RE | Admit: 2022-04-29 | Discharge: 2022-04-29 | Disposition: A | Discharge status: Home / Self Care | Payer: 59 | Source: Ambulatory Visit | Attending: Pain Medicine | Admitting: Pain Medicine

## 2022-04-29 ENCOUNTER — Encounter: Payer: Self-pay | Admitting: Pain Medicine

## 2022-04-29 ENCOUNTER — Other Ambulatory Visit: Payer: Self-pay

## 2022-04-29 ENCOUNTER — Ambulatory Visit: Payer: 59 | Attending: Pain Medicine | Admitting: Pain Medicine

## 2022-04-29 VITALS — BP 147/98 | HR 116 | Temp 98.4°F | Resp 15 | Ht 62.0 in | Wt 150.0 lb

## 2022-04-29 DIAGNOSIS — M25552 Pain in left hip: Secondary | ICD-10-CM | POA: Insufficient documentation

## 2022-04-29 DIAGNOSIS — M25551 Pain in right hip: Secondary | ICD-10-CM | POA: Insufficient documentation

## 2022-04-29 DIAGNOSIS — M79604 Pain in right leg: Secondary | ICD-10-CM | POA: Insufficient documentation

## 2022-04-29 DIAGNOSIS — G8929 Other chronic pain: Secondary | ICD-10-CM | POA: Diagnosis not present

## 2022-04-29 DIAGNOSIS — M961 Postlaminectomy syndrome, not elsewhere classified: Secondary | ICD-10-CM | POA: Diagnosis not present

## 2022-04-29 DIAGNOSIS — Z79899 Other long term (current) drug therapy: Secondary | ICD-10-CM | POA: Diagnosis not present

## 2022-04-29 DIAGNOSIS — M47816 Spondylosis without myelopathy or radiculopathy, lumbar region: Secondary | ICD-10-CM | POA: Diagnosis not present

## 2022-04-29 DIAGNOSIS — M545 Low back pain, unspecified: Secondary | ICD-10-CM | POA: Diagnosis not present

## 2022-04-29 DIAGNOSIS — G96198 Other disorders of meninges, not elsewhere classified: Secondary | ICD-10-CM | POA: Diagnosis not present

## 2022-04-29 DIAGNOSIS — R892 Abnormal level of other drugs, medicaments and biological substances in specimens from other organs, systems and tissues: Secondary | ICD-10-CM | POA: Diagnosis present

## 2022-04-29 DIAGNOSIS — Z79891 Long term (current) use of opiate analgesic: Secondary | ICD-10-CM | POA: Diagnosis not present

## 2022-04-29 DIAGNOSIS — G894 Chronic pain syndrome: Secondary | ICD-10-CM | POA: Diagnosis not present

## 2022-04-29 DIAGNOSIS — M76892 Other specified enthesopathies of left lower limb, excluding foot: Secondary | ICD-10-CM | POA: Diagnosis not present

## 2022-04-29 DIAGNOSIS — Z5189 Encounter for other specified aftercare: Secondary | ICD-10-CM

## 2022-04-29 MED ORDER — ROPIVACAINE HCL 2 MG/ML IJ SOLN
9.0000 mL | Freq: Once | INTRAMUSCULAR | Status: AC
  Administered 2022-04-29: 9 mL via INTRA_ARTICULAR

## 2022-04-29 MED ORDER — IOHEXOL 180 MG/ML  SOLN
10.0000 mL | Freq: Once | INTRAMUSCULAR | Status: AC
  Administered 2022-04-29: 5 mL via INTRA_ARTICULAR

## 2022-04-29 MED ORDER — LIDOCAINE HCL 2 % IJ SOLN
INTRAMUSCULAR | Status: AC
  Filled 2022-04-29: qty 20

## 2022-04-29 MED ORDER — METHYLPREDNISOLONE ACETATE 80 MG/ML IJ SUSP
80.0000 mg | Freq: Once | INTRAMUSCULAR | Status: AC
  Administered 2022-04-29: 80 mg via INTRA_ARTICULAR

## 2022-04-29 MED ORDER — LACTATED RINGERS IV SOLN: Freq: Once | INTRAVENOUS | Status: AC

## 2022-04-29 MED ORDER — MIDAZOLAM HCL 5 MG/5ML IJ SOLN
INTRAMUSCULAR | Status: AC
  Filled 2022-04-29: qty 5

## 2022-04-29 MED ORDER — METHYLPREDNISOLONE ACETATE 80 MG/ML IJ SUSP
INTRAMUSCULAR | Status: AC
  Filled 2022-04-29: qty 1

## 2022-04-29 MED ORDER — HYDROCODONE-ACETAMINOPHEN 5-325 MG PO TABS
1.0000 | ORAL_TABLET | Freq: Four times a day (QID) | ORAL | 0 refills | Status: DC | PRN
  Filled 2022-05-09: qty 28, 7d supply, fill #0

## 2022-04-29 MED ORDER — MIDAZOLAM HCL 5 MG/5ML IJ SOLN
0.5000 mg | Freq: Once | INTRAMUSCULAR | Status: AC
  Administered 2022-04-29: 2 mg via INTRAVENOUS

## 2022-04-29 MED ORDER — PENTAFLUOROPROP-TETRAFLUOROETH EX AERO
INHALATION_SPRAY | Freq: Once | CUTANEOUS | Status: AC
  Administered 2022-04-29: 30 via TOPICAL

## 2022-04-29 MED ORDER — ROPIVACAINE HCL 2 MG/ML IJ SOLN
INTRAMUSCULAR | Status: AC
  Filled 2022-04-29: qty 20

## 2022-04-29 MED ORDER — LIDOCAINE HCL 2 % IJ SOLN
20.0000 mL | Freq: Once | INTRAMUSCULAR | Status: AC
  Administered 2022-04-29: 400 mg

## 2022-04-30 ENCOUNTER — Telehealth: Payer: Self-pay

## 2022-04-30 NOTE — Telephone Encounter (Signed)
Post procedure phone call.  LM 

## 2022-05-02 LAB — TOXASSURE SELECT 13 (MW), URINE

## 2022-05-08 ENCOUNTER — Other Ambulatory Visit: Payer: Self-pay

## 2022-05-09 ENCOUNTER — Other Ambulatory Visit: Payer: Self-pay

## 2022-05-11 NOTE — Progress Notes (Unsigned)
PROVIDER NOTE: Information contained herein reflects review and annotations entered in association with encounter. Interpretation of such information and data should be left to medically-trained personnel. Information provided to patient can be located elsewhere in the medical record under "Patient Instructions". Document created using STT-dictation technology, any transcriptional errors that may result from process are unintentional.    Patient: Tammy Deleon  Service Category: E/M  Provider: Gaspar Cola, MD  DOB: [birth year]  DOS: 05/14/2022  Specialty: Interventional Pain Management  MRN: [id number]  Setting: Ambulatory outpatient  PCP: Adin Hector, MD  Type: Established Patient    Referring Provider: Adin Hector, MD  Location: Office  Delivery: Face-to-face     HPI  [name], a 36 y.o. year old female, is here today because of her No primary diagnosis found.. [name]'s primary complain today is No chief complaint on file. Last encounter: My last encounter with her was on 04/29/2022. Pertinent problems: [name] has Headache disorder; Lumbar disc disease; Chronic pain syndrome; Failed back surgical syndrome; Chronic low back pain (1ry area of Pain) (Right) w/o sciatica; Epidural fibrosis (S1) (Right); Lumbar facet joint syndrome (Right); Chronic hip pain (Right); Abnormal MRI, lumbar spine (02/04/2021); Lumbosacral facet arthropathy (L5-S1); Lumbosacral lateral recess stenosis (L5-S1) (Bilateral); History of lumbar laminectomy x2; Chronic sacroiliac joint pain (Right); Somatic dysfunction of sacroiliac joint (Right); Other spondylosis, sacral and sacrococcygeal region; Enthesopathy of sacroiliac joint (Right); Chronic lower extremity pain (intermittent) (2ry area of Pain) (Right); DDD (degenerative disc disease), lumbosacral; Spondylosis without myelopathy or radiculopathy, lumbosacral region; Trigger point with back pain (PSIS) (Right); Chronic hip pain  (Bilateral); Sacroiliac joint dysfunction (Bilateral); Chronic hip pain (Left); and Enthesopathy of hip region (Left) on their pertinent problem list. Pain Assessment: Severity of   is reported as a  /10. Location:    / . Onset:  . Quality:  . Timing:  . Modifying factor(s):  Marland Kitchen Vitals:  vitals were not taken for this visit.   Reason for encounter:  *** . ***  Pharmacotherapy Assessment  Analgesic: Hydrocodone/APAP 5/325, 1 tab p.o. 4 times daily  MME/day: 20 mg/day   Monitoring: [place] PMP: PDMP reviewed during this encounter.       Pharmacotherapy: No side-effects or adverse reactions reported. Compliance: No problems identified. Effectiveness: Clinically acceptable.  No notes on file  UDS:  Summary  Date Value Ref Range Status  04/29/2022 Note  Final    Comment:    ==================================================================== [employer] (MW) ==================================================================== Test                             Result       Flag       Units  Drug Present and Declared for Prescription Verification   Norhydrocodone                 36           EXPECTED   ng/mg creat    Norhydrocodone is an expected metabolite of hydrocodone.  Drug Present not Declared for Prescription Verification   Tramadol                       >3497        UNEXPECTED ng/mg creat   O-Desmethyltramadol            >3497        UNEXPECTED ng/mg creat   N-Desmethyltramadol  1395         UNEXPECTED ng/mg creat    Source of tramadol is a prescription medication. O-desmethyltramadol    and N-desmethyltramadol are expected metabolites of tramadol.  Drug Absent but Declared for Prescription Verification   Amphetamine                    Not Detected UNEXPECTED ng/mg creat   Hydrocodone                    Not Detected UNEXPECTED ng/mg creat    Hydrocodone is almost always present in patients taking this drug    consistently. Absence of hydrocodone could be due to  lapse of time    since the last dose or unusual pharmacokinetics (rapid metabolism).  ==================================================================== Test                      Result    Flag   Units      Ref Range   Creatinine              143              mg/dL      >=20 ==================================================================== Declared Medications:  The flagging and interpretation on this report are based on the  following declared medications.  Unexpected results may arise from  inaccuracies in the declared medications.   **Note: The testing scope of this panel includes these medications:   Amphetamine (Adderall)  Hydrocodone (Norco)   **Note: The testing scope of this panel does not include the  following reported medications:   Acetaminophen (Norco)  Ethinyl Estradiol  Ibuprofen (Advil)  Lamotrigine (Lamictal)  Levonorgestrel  Phentermine (Adipex)  Trazodone (Desyrel)  Valacyclovir (Valtrex) ==================================================================== For clinical consultation, please call [phone]. ====================================================================      ROS  Constitutional: Denies any fever or chills Gastrointestinal: No reported hemesis, hematochezia, vomiting, or acute GI distress Musculoskeletal: Denies any acute onset joint swelling, redness, loss of ROM, or weakness Neurological: No reported episodes of acute onset apraxia, aphasia, dysarthria, agnosia, amnesia, paralysis, loss of coordination, or loss of consciousness  Medication Review  HYDROcodone-acetaminophen, Levonorgestrel-Ethinyl Estradiol, amphetamine-dextroamphetamine, ibuprofen, lamoTRIgine, phentermine, traZODone, and valACYclovir  History Review  Allergy: [name] is allergic to sulfa antibiotics. Drug: [name]  reports no history of drug use. Alcohol:  reports current alcohol use. Tobacco:  reports that she has never smoked. She has never  used smokeless tobacco. Social: [name]  reports that she has never smoked. She has never used smokeless tobacco. She reports current alcohol use. She reports that she does not use drugs. Medical:  has a past medical history of Anxiety, Depression, Irregular intermenstrual bleeding, and UTI (lower urinary tract infection). Surgical: Ms. Schiltz  has a past surgical history that includes Gallbladder surgery; Hemi-microdiscectomy lumbar laminectomy level 1 (Right, 01/02/2020); and Hemi-microdiscectomy lumbar laminectomy level 1 (Right, 11/05/2020). Family: family history is not on file.  Laboratory Chemistry Profile   Renal Lab Results  Component Value Date   BUN 15 03/13/2021   CREATININE 0.89 03/13/2021   LABCREA 208 09/21/2019   BCR 17 03/13/2021   GFRAA >60 12/30/2019   GFRNONAA >60 10/29/2020    Hepatic Lab Results  Component Value Date   AST 18 03/13/2021   ALT 11 09/22/2019   ALBUMIN 4.4 03/13/2021   ALKPHOS 67 03/13/2021   LIPASE 80 03/17/2012    Electrolytes Lab Results  Component Value Date   NA 139 03/13/2021  K 4.7 03/13/2021   CL 103 03/13/2021   CALCIUM 9.9 03/13/2021   MG 1.9 03/13/2021    Bone Lab Results  Component Value Date   25OHVITD1 50 03/13/2021   25OHVITD2 <1.0 03/13/2021   25OHVITD3 50 03/13/2021    Inflammation (CRP: Acute Phase) (ESR: Chronic Phase) Lab Results  Component Value Date   CRP 9 03/13/2021   ESRSEDRATE 14 03/13/2021         Note: Above Lab results reviewed.  Recent Imaging Review  DG PAIN CLINIC C-ARM 1-60 MIN NO REPORT Fluoro was used, but no Radiologist interpretation will be provided.  Please refer to "NOTES" tab for provider progress note. Note: Reviewed        Physical Exam  General appearance: Well nourished, well developed, and well hydrated. In no apparent acute distress Mental status: Alert, oriented x 3 (person, place, & time)       Respiratory: No evidence of acute respiratory distress Eyes: PERLA Vitals:  There were no vitals taken for this visit. BMI: Estimated body mass index is 27.44 kg/m as calculated from the following:   Height as of 04/29/22: 5' 2" (1.575 m).   Weight as of 04/29/22: 150 lb (68 kg). Ideal: Ideal body weight: 50.1 kg (110 lb 7.2 oz) Adjusted ideal body weight: 57.3 kg (126 lb 4.3 oz)  Assessment   Diagnosis Status  No diagnosis found. Controlled Controlled Controlled   Updated Problems: No problems updated.  Plan of Care  Problem-specific:  No problem-specific Assessment & Plan notes found for this encounter.  Ms. AYLEEN MCKINSTRY has a current medication list which includes the following long-term medication(s): amphetamine-dextroamphetamine, amphetamine-dextroamphetamine, hydrocodone-acetaminophen, lamotrigine, levonorgestrel-ethinyl estradiol, levonorgestrel-ethinyl estradiol, phentermine, phentermine, and trazodone.  Pharmacotherapy (Medications Ordered): No orders of the defined types were placed in this encounter.  Orders:  No orders of the defined types were placed in this encounter.  Follow-up plan:   No follow-ups on file.     Interventional Therapies  Risk  Complexity Considerations:   Estimated body mass index is 29.08 kg/m as calculated from the following:   Height as of this encounter: 5' 2" (1.575 m).   Weight as of this encounter: 159 lb (72.1 kg). WNL   Planned  Pending:   Diagnostic left IA Hip inj. #1  Diagnostic right PSIS TPI #1    Under consideration:   Possible right lumbar facet RFA  Possible right SI joint RFA  Possible candidate for right thoracolumbar spinal cord stimulator trial/implant    Completed:   Diagnostic right SI Blk x2 (12/05/2021) (100/100 x5 days/80/80)  Diagnostic right caudal ESI x1 + diagnostic epidurogram (05/16/2021) (100/100/0/0)  Therapeutic right Racz procedure x1 (06/13/2021) (75/75/70 5/100% for the lower extremity pain but not the back) Diagnostic right lumbar facet MBB x2 (12/05/2021)  (100/100 x5 days/80/80)    Completed by Dr. Sharlet Salina: Right L5-S1 TFESI x1 (12/07/2019)  Right S1 tTFESI x1 (12/07/2019)   Therapeutic  Palliative (PRN) options:   None established     Recent Visits Date Type Provider Dept  04/29/22 Procedure visit Milinda Pointer, MD [facility]  04/02/22 Office Visit Milinda Pointer, MD [facility]  Showing recent visits within past 90 days and meeting all other requirements Future Appointments Date Type Provider Dept  05/14/22 Appointment Milinda Pointer, MD [facility]  Showing future appointments within next 90 days and meeting all other requirements  I discussed the assessment and treatment plan with the patient. The patient was provided an opportunity  to ask questions and all were answered. The patient agreed with the plan and demonstrated an understanding of the instructions.  Patient advised to call back or seek an in-person evaluation if the symptoms or condition worsens.  Duration of encounter: *** minutes.  Total time on encounter, as per AMA guidelines included both the face-to-face and non-face-to-face time personally spent by the physician and/or other qualified health care professional(s) on the day of the encounter (includes time in activities that require the physician or other qualified health care professional and does not include time in activities normally performed by clinical staff). Physician's time may include the following activities when performed: preparing to see the patient (eg, review of tests, pre-charting review of records) obtaining and/or reviewing separately obtained history performing a medically appropriate examination and/or evaluation counseling and educating the patient/family/caregiver ordering medications, tests, or procedures referring and communicating with other health care professionals (when not separately reported) documenting clinical information in the  electronic or other health record independently interpreting results (not separately reported) and communicating results to the patient/ family/caregiver care coordination (not separately reported)  Note by: Gaspar Cola, MD Date: 05/14/2022; Time: 3:48 PM

## 2022-05-12 ENCOUNTER — Other Ambulatory Visit: Payer: Self-pay

## 2022-05-13 ENCOUNTER — Other Ambulatory Visit: Payer: Self-pay

## 2022-05-13 ENCOUNTER — Ambulatory Visit: Payer: 59 | Admitting: Pain Medicine

## 2022-05-14 ENCOUNTER — Other Ambulatory Visit: Payer: Self-pay

## 2022-05-14 ENCOUNTER — Encounter: Payer: Self-pay | Admitting: Pain Medicine

## 2022-05-14 ENCOUNTER — Ambulatory Visit: Payer: 59 | Attending: Pain Medicine | Admitting: Pain Medicine

## 2022-05-14 VITALS — BP 168/111 | HR 118 | Temp 97.4°F | Ht 62.0 in | Wt 150.0 lb

## 2022-05-14 DIAGNOSIS — M25552 Pain in left hip: Secondary | ICD-10-CM | POA: Diagnosis not present

## 2022-05-14 DIAGNOSIS — R937 Abnormal findings on diagnostic imaging of other parts of musculoskeletal system: Secondary | ICD-10-CM | POA: Insufficient documentation

## 2022-05-14 DIAGNOSIS — M47816 Spondylosis without myelopathy or radiculopathy, lumbar region: Secondary | ICD-10-CM | POA: Diagnosis not present

## 2022-05-14 DIAGNOSIS — Z79899 Other long term (current) drug therapy: Secondary | ICD-10-CM | POA: Insufficient documentation

## 2022-05-14 DIAGNOSIS — M961 Postlaminectomy syndrome, not elsewhere classified: Secondary | ICD-10-CM | POA: Diagnosis not present

## 2022-05-14 DIAGNOSIS — Z91148 Patient's other noncompliance with medication regimen for other reason: Secondary | ICD-10-CM | POA: Diagnosis not present

## 2022-05-14 DIAGNOSIS — G8929 Other chronic pain: Secondary | ICD-10-CM | POA: Insufficient documentation

## 2022-05-14 DIAGNOSIS — M5137 Other intervertebral disc degeneration, lumbosacral region: Secondary | ICD-10-CM | POA: Insufficient documentation

## 2022-05-14 DIAGNOSIS — R892 Abnormal level of other drugs, medicaments and biological substances in specimens from other organs, systems and tissues: Secondary | ICD-10-CM | POA: Insufficient documentation

## 2022-05-14 DIAGNOSIS — Z79891 Long term (current) use of opiate analgesic: Secondary | ICD-10-CM | POA: Diagnosis not present

## 2022-05-14 DIAGNOSIS — G894 Chronic pain syndrome: Secondary | ICD-10-CM | POA: Diagnosis not present

## 2022-05-14 DIAGNOSIS — M47817 Spondylosis without myelopathy or radiculopathy, lumbosacral region: Secondary | ICD-10-CM | POA: Diagnosis not present

## 2022-05-14 DIAGNOSIS — M545 Low back pain, unspecified: Secondary | ICD-10-CM | POA: Insufficient documentation

## 2022-05-14 MED ORDER — HYDROCODONE-ACETAMINOPHEN 5-325 MG PO TABS
ORAL_TABLET | ORAL | 0 refills | Status: AC
  Filled 2022-05-16: qty 42, 21d supply, fill #0

## 2022-05-14 NOTE — Progress Notes (Signed)
Nursing Pain Medication Assessment:  Safety precautions to be maintained throughout the outpatient stay will include: orient to surroundings, keep bed in low position, maintain call bell within reach at all times, provide assistance with transfer out of bed and ambulation.  Medication Inspection Compliance: Pill count conducted under aseptic conditions, in front of the patient. Neither the pills nor the bottle was removed from the patient's sight at any time. Once count was completed pills were immediately returned to the patient in their original bottle.  Medication: Hydrocodone/APAP Pill/Patch Count:  2 of 28 pills remain Pill/Patch Appearance: Markings consistent with prescribed medication Bottle Appearance: Standard pharmacy container. Clearly labeled. Filled Date: 7 / 7 / 2023 Last Medication intake:  TodaySafety precautions to be maintained throughout the outpatient stay will include: orient to surroundings, keep bed in low position, maintain call bell within reach at all times, provide assistance with transfer out of bed and ambulation.

## 2022-05-14 NOTE — Patient Instructions (Addendum)
______________________________________________________________________  Preparing for Procedure with Sedation  NOTICE: Due to recent regulatory changes, starting on June 03, 2021, procedures requiring intravenous (IV) sedation will no longer be performed at the [facility].  These types of procedures are required to be performed at [facility] ambulatory surgery facility.  We are very sorry for the inconvenience.  Procedure appointments are limited to planned procedures: No Prescription Refills. No disability issues will be discussed. No medication changes will be discussed.  Instructions: Oral Intake: Do not eat or drink anything for at least 8 hours prior to your procedure. (Exception: Blood Pressure Medication. See below.) Transportation: A driver is required. You may not drive yourself after the procedure. Blood Pressure Medicine: Do not forget to take your blood pressure medicine with a sip of water the morning of the procedure. If your Diastolic (lower reading) is above 100 mmHg, elective cases will be cancelled/rescheduled. Blood thinners: These will need to be stopped for procedures. Notify our staff if you are taking any blood thinners. Depending on which one you take, there will be specific instructions on how and when to stop it. Diabetics on insulin: Notify the staff so that you can be scheduled 1st case in the morning. If your diabetes requires high dose insulin, take only  of your normal insulin dose the morning of the procedure and notify the staff that you have done so. Preventing infections: Shower with an antibacterial soap the morning of your procedure. Build-up your immune system: Take 1000 mg of Vitamin C with every meal (3 times a day) the day prior to your procedure. Antibiotics: Inform the staff if you have a condition or reason that requires you to take antibiotics before dental procedures. Pregnancy: If you are pregnant, call and cancel the procedure. Sickness: If  you have a cold, fever, or any active infections, call and cancel the procedure. Arrival: You must be in the facility at least 30 minutes prior to your scheduled procedure. Children: Do not bring children with you. Dress appropriately: There is always the possibility that your clothing may get soiled. Valuables: Do not bring any jewelry or valuables.  Reasons to call and reschedule or cancel your procedure: (Following these recommendations will minimize the risk of a serious complication.) Surgeries: Avoid having procedures within 2 weeks of any surgery. (Avoid for 2 weeks before or after any surgery). Flu Shots: Avoid having procedures within 2 weeks of a flu shots. (Avoid for 2 weeks before or after immunizations). Barium: Avoid having a procedure within 7-10 days after having had a radiological study involving the use of radiological contrast. (Myelograms, Barium swallow or enema study). Heart attacks: Avoid any elective procedures or surgeries for the initial 6 months after a "Myocardial Infarction" (Heart Attack). Blood thinners: It is imperative that you stop these medications before procedures. Let us know if you if you take any blood thinner.  Infection: Avoid procedures during or within two weeks of an infection (including chest colds or gastrointestinal problems). Symptoms associated with infections include: Localized redness, fever, chills, night sweats or profuse sweating, burning sensation when voiding, cough, congestion, stuffiness, runny nose, sore throat, diarrhea, nausea, vomiting, cold or Flu symptoms, recent or current infections. It is specially important if the infection is over the area that we intend to treat. Heart and lung problems: Symptoms that may suggest an active cardiopulmonary problem include: cough, chest pain, breathing difficulties or shortness of breath, dizziness, ankle swelling, uncontrolled high or unusually low blood pressure, and/or palpitations. If you are  experiencing any of these symptoms, cancel your procedure and contact your primary care physician for an evaluation.  Remember:  Regular Business hours are:  Monday to Thursday 8:00 AM to 4:00 PM  Provider's Schedule: Milinda Pointer, MD:  Procedure days: Tuesday and Thursday 7:30 AM to 4:00 PM  Gillis Santa, MD:  Procedure days: Monday and Wednesday 7:30 AM to 4:00 PM ______________________________________________________________________  ____________________________________________________________________________________________  General Risks and Possible Complications  Patient Responsibilities: It is important that you read this as it is part of your informed consent. It is our duty to inform you of the risks and possible complications associated with treatments offered to you. It is your responsibility as a patient to read this and to ask questions about anything that is not clear or that you believe was not covered in this document.  Patient's Rights: You have the right to refuse treatment. You also have the right to change your mind, even after initially having agreed to have the treatment done. However, under this last option, if you wait until the last second to change your mind, you may be charged for the materials used up to that point.  Introduction: Medicine is not an Chief Strategy Officer. Everything in Medicine, including the lack of treatment(s), carries the potential for danger, harm, or loss (which is by definition: Risk). In Medicine, a complication is a secondary problem, condition, or disease that can aggravate an already existing one. All treatments carry the risk of possible complications. The fact that a side effects or complications occurs, does not imply that the treatment was conducted incorrectly. It must be clearly understood that these can happen even when everything is done following the highest safety standards.  No treatment: You can choose not to proceed with the  proposed treatment alternative. The "PRO(s)" would include: avoiding the risk of complications associated with the therapy. The "CON(s)" would include: not getting any of the treatment benefits. These benefits fall under one of three categories: diagnostic; therapeutic; and/or palliative. Diagnostic benefits include: getting information which can ultimately lead to improvement of the disease or symptom(s). Therapeutic benefits are those associated with the successful treatment of the disease. Finally, palliative benefits are those related to the decrease of the primary symptoms, without necessarily curing the condition (example: decreasing the pain from a flare-up of a chronic condition, such as incurable terminal cancer).  General Risks and Complications: These are associated to most interventional treatments. They can occur alone, or in combination. They fall under one of the following six (6) categories: no benefit or worsening of symptoms; bleeding; infection; nerve damage; allergic reactions; and/or death. No benefits or worsening of symptoms: In Medicine there are no guarantees, only probabilities. No healthcare provider can ever guarantee that a medical treatment will work, they can only state the probability that it may. Furthermore, there is always the possibility that the condition may worsen, either directly, or indirectly, as a consequence of the treatment. Bleeding: This is more common if the patient is taking a blood thinner, either prescription or over the counter (example: Goody Powders, Fish oil, Aspirin, Garlic, etc.), or if suffering a condition associated with impaired coagulation (example: Hemophilia, cirrhosis of the liver, low platelet counts, etc.). However, even if you do not have one on these, it can still happen. If you have any of these conditions, or take one of these drugs, make sure to notify your treating physician. Infection: This is more common in patients with a compromised  immune system, either due to disease (example:  diabetes, cancer, human immunodeficiency virus [HIV], etc.), or due to medications or treatments (example: therapies used to treat cancer and rheumatological diseases). However, even if you do not have one on these, it can still happen. If you have any of these conditions, or take one of these drugs, make sure to notify your treating physician. Nerve Damage: This is more common when the treatment is an invasive one, but it can also happen with the use of medications, such as those used in the treatment of cancer. The damage can occur to small secondary nerves, or to large primary ones, such as those in the spinal cord and brain. This damage may be temporary or permanent and it may lead to impairments that can range from temporary numbness to permanent paralysis and/or brain death. Allergic Reactions: Any time a substance or material comes in contact with our body, there is the possibility of an allergic reaction. These can range from a mild skin rash (contact dermatitis) to a severe systemic reaction (anaphylactic reaction), which can result in death. Death: In general, any medical intervention can result in death, most of the time due to an unforeseen complication. ____________________________________________________________________________________________  ____________________________________________________________________________________________  Pharmacy Shortages of Pain Medication   Introduction Shockingly as it may seem, .  "No U.S[place] Supreme Court decision has ever interpreted the Constitution as guaranteeing a right to health care for all Americans." - https://huff.com/  "With respect to human rights, the [place] has no formally codified right to health, nor does it participate in a human rights treaty that specifies a right to health." - Scott J. Schweikart, JD,  MBE  Situation By now, most of our patients have had the experience of being told by their pharmacist that they do not have enough medication to cover their prescription. If you have not had this experience, just know that you soon will.  Problem There appears to be a shortage of these medications, either at the national level or locally. This is happening with all pharmacies. When there is not enough medication, patients are offered a partial fill and they are told that they will try to get the rest of the medicine for them at a later time. If they do not have enough for even a partial fill, the pharmacists are telling the patients to call us (the prescribing physicians) to request that we send another prescription to another pharmacy to get the medicine.   This reordering of a controlled substance creates documentation problems where additional paperwork needs to be created to explain why two prescriptions for the same period of time and the same medicine are being prescribed to the same patient. It also creates situations where the last appointment note does not accurately reflect when and what prescriptions were given to a patient. This leads to prescribing errors down the line, in subsequent follow-up visits.   [employer] of Pharmacy ([employer]) Research revealed that Surveyor, quantity .1806 (21 NCAC 46.1806) authorizes pharmacists to the transfer of prescriptions among pharmacies, and it sets forth procedural and recordkeeping requirements for doing so. However, this requires the pharmacist to complete the previously mentioned procedural paperwork to accomplish the transfer. As it turns out, it is much easier for them to have the prescribing physicians do the work.   Possible solutions 1. Have the [facility] Assembly add a provision to the "STOP ACT" (the law that mandates how controlled substances are prescribed) where there is an exception to the electronic prescribing rule  that states  that in the event there are shortages of medications the physicians are allowed to use written prescriptions as opposed to electronic ones. This would allow patients to take their prescriptions to a different pharmacy that may have enough medication available to fill the prescription. The problem is that currently there is a law that does not allow for written prescriptions, with the exception of instances where the electronic medical record is down due to technical issues.  2. Have [place] Congress ease the pressure on pharmaceutical companies, allowing them to produce enough quantities of the medication to adequately supply the population. 3. Have pharmacies keep enough stocks of these medications to cover their client base.  4. Have the [facility] Assembly add a provision to the "STOP ACT" where they ease the regulations surrounding the transfer of controlled substances between pharmacies, so as to simplify the transfer of supplies. As an alternative, develop a system to allow patients to obtain the remainder of their prescription at another one of their pharmacies or at an associate pharmacy.   How this shortage will affect you.  The one thing that is abundantly clear is that this is a pharmacy supply problem  and not a prescriber problem. The job of the prescriber is to evaluate and monitor the patients for the appropriate indications to the use of these medicines, the monitoring of their use and the prescribing of the appropriate dose and regimen. It is not the job of the prescriber to provide or dispense the actual medication. By law, this is the job of the pharmacies and pharmacists. It is certainly not the job of the prescriber to solve the supply problems.   Due to the above problems we are no longer taking patients to write for their pain medication. We will continue to evaluate for appropriate indications and we may provide recommendations regarding medication, dose, and  schedule, as well as monitoring recommendations, however, we will not be taking over the actual prescribing of these substances. On those patients where we are treating their chronic pain with interventional therapies, exceptions will be considered on a case by case basis. At this time, we will try to continue providing this supplemental service to those patients we have been managing in the past. However, as of August 1st, 2023, we no longer will be sending additional prescriptions to other pharmacies for the purpose of solving their supply problems. Once we send a prescription to a pharmacy, we will not be resending it again to another pharmacy to cover for their shortages.   What to do. Write as many letters as you can. Recruit the help of family members in writing these letters. Below are some of the places where you can write to make your voice heard. Let them know what the problem is and push them to look for solutions.   Search internet for: "[employer] find your legislators" NoseSwap.is  Search internet for: "[employer] commissioner complaints" Starlas.fi  Search internet for: "[place] complaints" https://www.hernandez-brewer.com/.htm  Search internet for: "[facility] pharmacy complaints" Email [facility] Customer Relations woondaal.com.jsp?callType=store  Search internet for: Programme researcher, broadcasting/film/video customer service complaints" https://www.[facility].com/topic/marketing/contactus/contactus_customerservice.jsp  ____________________________________________________________________________________________  ____________________________________________________________________________________________  Medication Rules  Purpose: To inform patients, and their family members, of our rules and regulations.  Applies to: All patients  receiving prescriptions (written or electronic).  Pharmacy of record: Pharmacy where electronic prescriptions will be sent. If written prescriptions are taken to a different pharmacy, please inform the nursing staff. The pharmacy listed in  the electronic medical record should be the one where you would like electronic prescriptions to be sent.  Electronic prescriptions: In compliance with the [place] (STOP) Act of 2017 (Session Lanny Cramp [phone]), effective November 03, 2018, all controlled substances must be electronically prescribed. Calling prescriptions to the pharmacy will cease to exist.  Prescription refills: Only during scheduled appointments. Applies to all prescriptions.  NOTE: The following applies primarily to controlled substances (Opioid* Pain Medications).   Type of encounter (visit): For patients receiving controlled substances, face-to-face visits are required. (Not an option or up to the patient.)  Patient's responsibilities: Pain Pills: Bring all pain pills to every appointment (except for procedure appointments). Pill Bottles: Bring pills in original pharmacy bottle. Always bring the newest bottle. Bring bottle, even if empty. Medication refills: You are responsible for knowing and keeping track of what medications you take and those you need refilled. The day before your appointment: write a list of all prescriptions that need to be refilled. The day of the appointment: give the list to the admitting nurse. Prescriptions will be written only during appointments. No prescriptions will be written on procedure days. If you forget a medication: it will not be "Called in", "Faxed", or "electronically sent". You will need to get another appointment to get these prescribed. No early refills. Do not call asking to have your prescription filled early. Prescription Accuracy: You are responsible for carefully inspecting your prescriptions before  leaving our office. Have the discharge nurse carefully go over each prescription with you, before taking them home. Make sure that your name is accurately spelled, that your address is correct. Check the name and dose of your medication to make sure it is accurate. Check the number of pills, and the written instructions to make sure they are clear and accurate. Make sure that you are given enough medication to last until your next medication refill appointment. Taking Medication: Take medication as prescribed. When it comes to controlled substances, taking less pills or less frequently than prescribed is permitted and encouraged. Never take more pills than instructed. Never take medication more frequently than prescribed.  Inform other Doctors: Always inform, all of your healthcare providers, of all the medications you take. Pain Medication from other Providers: You are not allowed to accept any additional pain medication from any other Doctor or Healthcare provider. There are two exceptions to this rule. (see below) In the event that you require additional pain medication, you are responsible for notifying us, as stated below. Cough Medicine: Often these contain an opioid, such as codeine or hydrocodone. Never accept or take cough medicine containing these opioids if you are already taking an opioid* medication. The combination may cause respiratory failure and death. Medication Agreement: You are responsible for carefully reading and following our Medication Agreement. This must be signed before receiving any prescriptions from our practice. Safely store a copy of your signed Agreement. Violations to the Agreement will result in no further prescriptions. (Additional copies of our Medication Agreement are available upon request.) Laws, Rules, & Regulations: All patients are expected to follow all Federal and [employer], [employer], Rules, [employer]. Ignorance of the Laws does not constitute a valid  excuse.  Illegal drugs and Controlled Substances: The use of illegal substances (including, but not limited to marijuana and its derivatives) and/or the illegal use of any controlled substances is strictly prohibited. Violation of this rule may result in the immediate and permanent discontinuation of any and all prescriptions being  written by our practice. The use of any illegal substances is prohibited. Adopted CDC guidelines & recommendations: Target dosing levels will be at or below 60 MME/day. Use of benzodiazepines** is not recommended.  Exceptions: There are only two exceptions to the rule of not receiving pain medications from other Healthcare Providers. Exception #1 (Emergencies): In the event of an emergency (i.e.: accident requiring emergency care), you are allowed to receive additional pain medication. However, you are responsible for: As soon as you are able, call our office [phone], at any time of the day or night, and leave a message stating your name, the date and nature of the emergency, and the name and dose of the medication prescribed. In the event that your call is answered by a member of our staff, make sure to document and save the date, time, and the name of the person that took your information.  Exception #2 (Planned Surgery): In the event that you are scheduled by another doctor or dentist to have any type of surgery or procedure, you are allowed (for a period no longer than 30 days), to receive additional pain medication, for the acute post-op pain. However, in this case, you are responsible for picking up a copy of our "Post-op Pain Management for Surgeons" handout, and giving it to your surgeon or dentist. This document is available at our office, and does not require an appointment to obtain it. Simply go to our office during business hours (Monday-Thursday from 8:00 AM to 4:00 PM) (Friday 8:00 AM to 12:00 Noon) or if you have a scheduled appointment with [place], prior to your  surgery, and ask for it by name. In addition, you are responsible for: calling our office [phone], at any time of the day or night, and leaving a message stating your name, name of your surgeon, type of surgery, and date of procedure or surgery. Failure to comply with your responsibilities may result in termination of therapy involving the controlled substances. Medication Agreement Violation. Following the above rules, including your responsibilities will help you in avoiding a Medication Agreement Violation ("Breaking your Pain Medication Contract").  *Opioid medications include: morphine, codeine, oxycodone, oxymorphone, hydrocodone, hydromorphone, meperidine, tramadol, tapentadol, buprenorphine, fentanyl, methadone. **Benzodiazepine medications include: diazepam (Valium), alprazolam (Xanax), clonazepam (Klonopine), lorazepam (Ativan), clorazepate (Tranxene), chlordiazepoxide (Librium), estazolam (Prosom), oxazepam (Serax), temazepam (Restoril), triazolam (Halcion) (Last updated: 07/31/2021) ____________________________________________________________________________________________  ____________________________________________________________________________________________  Medication Recommendations and Reminders  Applies to: All patients receiving prescriptions (written and/or electronic).  Medication Rules & Regulations: These rules and regulations exist for your safety and that of others. They are not flexible and neither are we. Dismissing or ignoring them will be considered "non-compliance" with medication therapy, resulting in complete and irreversible termination of such therapy. (See document titled "Medication Rules" for more details.) In all conscience, because of safety reasons, we cannot continue providing a therapy where the patient does not follow instructions.  Pharmacy of record:  Definition: This is the pharmacy where your electronic prescriptions will be sent.  We do  not endorse any particular pharmacy, however, we have experienced problems with [facility] not securing enough medication supply for the community. We do not restrict you in your choice of pharmacy. However, once we write for your prescriptions, we will NOT be re-sending more prescriptions to fix restricted supply problems created by your pharmacy, or your insurance.  The pharmacy listed in the electronic medical record should be the one where you want electronic prescriptions to be sent. If you choose to change  pharmacy, simply notify our nursing staff.  Recommendations: Keep all of your pain medications in a safe place, under lock and key, even if you live alone. We will NOT replace lost, stolen, or damaged medication. After you fill your prescription, take 1 week's worth of pills and put them away in a safe place. You should keep a separate, properly labeled bottle for this purpose. The remainder should be kept in the original bottle. Use this as your primary supply, until it runs out. Once it's gone, then you know that you have 1 week's worth of medicine, and it is time to come in for a prescription refill. If you do this correctly, it is unlikely that you will ever run out of medicine. To make sure that the above recommendation works, it is very important that you make sure your medication refill appointments are scheduled at least 1 week before you run out of medicine. To do this in an effective manner, make sure that you do not leave the office without scheduling your next medication management appointment. Always ask the nursing staff to show you in your prescription , when your medication will be running out. Then arrange for the receptionist to get you a return appointment, at least 7 days before you run out of medicine. Do not wait until you have 1 or 2 pills left, to come in. This is very poor planning and does not take into consideration that we may need to cancel appointments due to bad weather,  sickness, or emergencies affecting our staff. DO NOT ACCEPT A "Partial Fill": If for any reason your pharmacy does not have enough pills/tablets to completely fill or refill your prescription, do not allow for a "partial fill". The law allows the pharmacy to complete that prescription within 72 hours, without requiring a new prescription. If they do not fill the rest of your prescription within those 72 hours, you will need a separate prescription to fill the remaining amount, which we will NOT provide. If the reason for the partial fill is your insurance, you will need to talk to the pharmacist about payment alternatives for the remaining tablets, but again, DO NOT ACCEPT A PARTIAL FILL, unless you can trust your pharmacist to obtain the remainder of the pills within 72 hours.  Prescription refills and/or changes in medication(s):  Prescription refills, and/or changes in dose or medication, will be conducted only during scheduled medication management appointments. (Applies to both, written and electronic prescriptions.) No refills on procedure days. No medication will be changed or started on procedure days. No changes, adjustments, and/or refills will be conducted on a procedure day. Doing so will interfere with the diagnostic portion of the procedure. No phone refills. No medications will be "called into the pharmacy". No Fax refills. No weekend refills. No Holliday refills. No after hours refills.  Remember:  Business hours are:  Monday to Thursday 8:00 AM to 4:00 PM Provider's Schedule: Milinda Pointer, MD - Appointments are:  Medication management: Monday and Wednesday 8:00 AM to 4:00 PM Procedure day: Tuesday and Thursday 7:30 AM to 4:00 PM Gillis Santa, MD - Appointments are:  Medication management: Tuesday and Thursday 8:00 AM to 4:00 PM Procedure day: Monday and Wednesday 7:30 AM to 4:00 PM (Last update:  05/23/2020) ____________________________________________________________________________________________  ____________________________________________________________________________________________  CBD (cannabidiol) & Delta-8 (Delta-8 tetrahydrocannabinol) WARNING  Intro: Cannabidiol (CBD) and tetrahydrocannabinol (THC), are two natural compounds found in plants of the Cannabis genus. They can both be extracted from hemp or cannabis. Hemp and  cannabis come from the Cannabis sativa plant. Both compounds interact with your body's endocannabinoid system, but they have very different effects. CBD does not produce the high sensation associated with cannabis. Delta-8 tetrahydrocannabinol, also known as delta-8 THC, is a psychoactive substance found in the Cannabis sativa plant, of which marijuana and hemp are two varieties. THC is responsible for the high associated with the illicit use of marijuana.  Applicable to: All individuals currently taking or considering taking CBD (cannabidiol) and, more important, all patients taking opioid analgesic controlled substances (pain medication). (Example: oxycodone; oxymorphone; hydrocodone; hydromorphone; morphine; methadone; tramadol; tapentadol; fentanyl; buprenorphine; butorphanol; dextromethorphan; meperidine; codeine; etc.)  Legal status: CBD remains a Schedule I drug prohibited for any use. CBD is illegal with one exception. In the [place], CBD has a limited Transport planner (FDA) approval for the treatment of two specific types of epilepsy disorders. Only one CBD product has been approved by the FDA for this purpose: "Epidiolex". FDA is aware that some companies are marketing products containing cannabis and cannabis-derived compounds in ways that violate the [employer], Drug and Cosmetic Act [facility] Act) and that may put the health and safety of consumers at risk. The FDA, a Federal agency, has not enforced the CBD status since 2018. UPDATE:  (12/20/2021) The Drug Enforcement Agency ([place]) issued a letter stating that "delta" cannabinoids, including Delta-8-THCO and Delta-9-THCO, synthetically derived from hemp do not qualify as hemp and will be viewed as Schedule I drugs. (Schedule I drugs, substances, or chemicals are defined as drugs with no currently accepted medical use and a high potential for abuse. Some examples of Schedule I drugs are: heroin, lysergic acid diethylamide (LSD), marijuana (cannabis), 3,4-methylenedioxymethamphetamine (ecstasy), methaqualone, and peyote.) (https://jennings.com/)  Legality: Some manufacturers ship CBD products nationally, which is illegal. Often such products are sold online and are therefore available throughout the country. CBD is openly sold in head shops and health food stores in some states where such sales have not been explicitly legalized. Selling unapproved products with unsubstantiated therapeutic claims is not only a violation of the law, but also can put patients at risk, as these products have not been proven to be safe or effective. Federal illegality makes it difficult to conduct research on CBD.  Reference: "FDA Regulation of Cannabis and Cannabis-Derived Products, Including Cannabidiol (CBD)" - [employer]Artists.[employer].pt  Warning: CBD is not FDA approved and has not undergo the same manufacturing controls as prescription drugs.  This means that the purity and safety of available CBD may be questionable. Most of the time, despite manufacturer's claims, it is contaminated with THC (delta-9-tetrahydrocannabinol - the chemical in marijuana responsible for the "HIGH").  When this is the case, the [facility] contaminant will trigger a positive urine drug screen (UDS) test for Marijuana (carboxy-THC). Because a positive UDS for any illicit substance is a violation of our medication agreement, your opioid  analgesics (pain medicine) may be permanently discontinued. The FDA recently put out a warning about 5 things that everyone should be aware of regarding Delta-8 THC: Delta-8 THC products have not been evaluated or approved by the FDA for safe use and may be marketed in ways that put the public health at risk. The FDA has received adverse event reports involving delta-8 THC-containing products. Delta-8 THC has psychoactive and intoxicating effects. Delta-8 THC manufacturing often involve use of potentially harmful chemicals to create the concentrations of delta-8 THC claimed in the marketplace. The final delta-8 THC product may have potentially harmful by-products (contaminants) due to the  chemicals used in the process. Manufacturing of delta-8 THC products may occur in uncontrolled or unsanitary settings, which may lead to the presence of unsafe contaminants or other potentially harmful substances. Delta-8 THC products should be kept out of the reach of children and pets.  MORE ABOUT CBD  General Information: CBD was discovered in [birth year] and it is a derivative of the cannabis sativa genus plants (Marijuana and Hemp). It is one of the 113 identified substances found in Marijuana. It accounts for up to 40% of the plant's extract. As of 2018, preliminary clinical studies on CBD included research for the treatment of anxiety, movement disorders, and pain. CBD is available and consumed in multiple forms, including inhalation of smoke or vapor, as an aerosol spray, and by mouth. It may be supplied as an oil containing CBD, capsules, dried cannabis, or as a liquid solution. CBD is thought not to be as psychoactive as THC (delta-9-tetrahydrocannabinol - the chemical in marijuana responsible for the "HIGH"). Studies suggest that CBD may interact with different biological target receptors in the body, including cannabinoid and other neurotransmitter receptors. As of 2018 the mechanism of action for its biological  effects has not been determined.  Side-effects  Adverse reactions: Dry mouth, diarrhea, decreased appetite, fatigue, drowsiness, malaise, weakness, sleep disturbances, and others.  Drug interactions: CBC may interact with other medications such as blood-thinners. Because CBD causes drowsiness on its own, it also increases the drowsiness caused by other medications, including antihistamines (such as Benadryl), benzodiazepines (Xanax, Ativan, Valium), antipsychotics, antidepressants and opioids, as well as alcohol and supplements such as kava, melatonin and St. John's Wort. Be cautious with the following combinations:   Brivaracetam (Briviact) Brivaracetam is changed and broken down by the body. CBD might decrease how quickly the body breaks down brivaracetam. This might increase levels of brivaracetam in the body.  Caffeine Caffeine is changed and broken down by the body. CBD might decrease how quickly the body breaks down caffeine. This might increase levels of caffeine in the body.  Carbamazepine (Tegretol) Carbamazepine is changed and broken down by the body. CBD might decrease how quickly the body breaks down carbamazepine. This might increase levels of carbamazepine in the body and increase its side effects.  Citalopram (Celexa) Citalopram is changed and broken down by the body. CBD might decrease how quickly the body breaks down citalopram. This might increase levels of citalopram in the body and increase its side effects.  Clobazam (Onfi) Clobazam is changed and broken down by the liver. CBD might decrease how quickly the liver breaks down clobazam. This might increase the effects and side effects of clobazam.  Eslicarbazepine (Aptiom) Eslicarbazepine is changed and broken down by the body. CBD might decrease how quickly the body breaks down eslicarbazepine. This might increase levels of eslicarbazepine in the body by a small amount.  Everolimus (Zostress) Everolimus is changed and  broken down by the body. CBD might decrease how quickly the body breaks down everolimus. This might increase levels of everolimus in the body.  Lithium Taking higher doses of CBD might increase levels of lithium. This can increase the risk of lithium toxicity.  Medications changed by the liver (Cytochrome P450 1A1 (CYP1A1) substrates) Some medications are changed and broken down by the liver. CBD might change how quickly the liver breaks down these medications. This could change the effects and side effects of these medications.  Medications changed by the liver (Cytochrome P450 1A2 (CYP1A2) substrates) Some medications are changed and broken down by the  liver. CBD might change how quickly the liver breaks down these medications. This could change the effects and side effects of these medications.  Medications changed by the liver (Cytochrome P450 1B1 (CYP1B1) substrates) Some medications are changed and broken down by the liver. CBD might change how quickly the liver breaks down these medications. This could change the effects and side effects of these medications.  Medications changed by the liver (Cytochrome P450 2A6 (CYP2A6) substrates) Some medications are changed and broken down by the liver. CBD might change how quickly the liver breaks down these medications. This could change the effects and side effects of these medications.  Medications changed by the liver (Cytochrome P450 2B6 (CYP2B6) substrates) Some medications are changed and broken down by the liver. CBD might change how quickly the liver breaks down these medications. This could change the effects and side effects of these medications.  Medications changed by the liver (Cytochrome P450 2C19 (CYP2C19) substrates) Some medications are changed and broken down by the liver. CBD might change how quickly the liver breaks down these medications. This could change the effects and side effects of these medications.  Medications  changed by the liver (Cytochrome P450 2C8 (CYP2C8) substrates) Some medications are changed and broken down by the liver. CBD might change how quickly the liver breaks down these medications. This could change the effects and side effects of these medications.  Medications changed by the liver (Cytochrome P450 2C9 (CYP2C9) substrates) Some medications are changed and broken down by the liver. CBD might change how quickly the liver breaks down these medications. This could change the effects and side effects of these medications.  Medications changed by the liver (Cytochrome P450 2D6 (CYP2D6) substrates) Some medications are changed and broken down by the liver. CBD might change how quickly the liver breaks down these medications. This could change the effects and side effects of these medications.  Medications changed by the liver (Cytochrome P450 2E1 (CYP2E1) substrates) Some medications are changed and broken down by the liver. CBD might change how quickly the liver breaks down these medications. This could change the effects and side effects of these medications.  Medications changed by the liver (Cytochrome P450 3A4 (CYP3A4) substrates) Some medications are changed and broken down by the liver. CBD might change how quickly the liver breaks down these medications. This could change the effects and side effects of these medications.  Medications changed by the liver (Glucuronidated drugs) Some medications are changed and broken down by the liver. CBD might change how quickly the liver breaks down these medications. This could change the effects and side effects of these medications.  Medications that decrease the breakdown of other medications by the liver (Cytochrome P450 2C19 (CYP2C19) inhibitors) CBD is changed and broken down by the liver. Some drugs decrease how quickly the liver changes and breaks down CBD. This could change the effects and side effects of CBD.  Medications that decrease  the breakdown of other medications in the liver (Cytochrome P450 3A4 (CYP3A4) inhibitors) CBD is changed and broken down by the liver. Some drugs decrease how quickly the liver changes and breaks down CBD. This could change the effects and side effects of CBD.  Medications that increase breakdown of other medications by the liver (Cytochrome P450 3A4 (CYP3A4) inducers) CBD is changed and broken down by the liver. Some drugs increase how quickly the liver changes and breaks down CBD. This could change the effects and side effects of CBD.  Medications that increase the  breakdown of other medications by the liver (Cytochrome P450 2C19 (CYP2C19) inducers) CBD is changed and broken down by the liver. Some drugs increase how quickly the liver changes and breaks down CBD. This could change the effects and side effects of CBD.  Methadone (Dolophine) Methadone is broken down by the liver. CBD might decrease how quickly the liver breaks down methadone. Taking cannabidiol along with methadone might increase the effects and side effects of methadone.  Rufinamide (Banzel) Rufinamide is changed and broken down by the body. CBD might decrease how quickly the body breaks down rufinamide. This might increase levels of rufinamide in the body by a small amount.  Sedative medications (CNS depressants) CBD might cause sleepiness and slowed breathing. Some medications, called sedatives, can also cause sleepiness and slowed breathing. Taking CBD with sedative medications might cause breathing problems and/or too much sleepiness.  Sirolimus (Rapamune) Sirolimus is changed and broken down by the body. CBD might decrease how quickly the body breaks down sirolimus. This might increase levels of sirolimus in the body.  Stiripentol (Diacomit) Stiripentol is changed and broken down by the body. CBD might decrease how quickly the body breaks down stiripentol. This might increase levels of stiripentol in the body and increase  its side effects.  Tacrolimus (Prograf) Tacrolimus is changed and broken down by the body. CBD might decrease how quickly the body breaks down tacrolimus. This might increase levels of tacrolimus in the body.  Tamoxifen (Soltamox) Tamoxifen is changed and broken down by the body. CBD might affect how quickly the body breaks down tamoxifen. This might affect levels of tamoxifen in the body.  Topiramate (Topamax) Topiramate is changed and broken down by the body. CBD might decrease how quickly the body breaks down topiramate. This might increase levels of topiramate in the body by a small amount.  Valproate Valproic acid can cause liver injury. Taking cannabidiol with valproic acid might increase the chance of liver injury. CBD and/or valproic acid might need to be stopped, or the dose might need to be reduced.  Warfarin (Coumadin) CBD might increase levels of warfarin, which can increase the risk for bleeding. CBD and/or warfarin might need to be stopped, or the dose might need to be reduced.  Zonisamide Zonisamide is changed and broken down by the body. CBD might decrease how quickly the body breaks down zonisamide. This might increase levels of zonisamide in the body by a small amount. (Last update: 01/01/2022) ____________________________________________________________________________________________  ____________________________________________________________________________________________  Drug Holidays (Slow)  What is a "Drug Holiday"? Drug Holiday: is the name given to the period of time during which a patient stops taking a medication(s) for the purpose of eliminating tolerance to the drug.  Benefits Improved effectiveness of opioids. Decreased opioid dose needed to achieve benefits. Improved pain with lesser dose.  What is tolerance? Tolerance: is the progressive decreased in effectiveness of a drug due to its repetitive use. With repetitive use, the body gets use to the  medication and as a consequence, it loses its effectiveness. This is a common problem seen with opioid pain medications. As a result, a larger dose of the drug is needed to achieve the same effect that used to be obtained with a smaller dose.  How long should a "Drug Holiday" last? You should stay off of the pain medicine for at least 14 consecutive days. (2 weeks)  Should I stop the medicine "cold [place]"? No. You should always coordinate with your Pain Specialist so that he/she can provide you with the  correct medication dose to make the transition as smoothly as possible.  How do I stop the medicine? Slowly. You will be instructed to decrease the daily amount of pills that you take by one (1) pill every seven (7) days. This is called a "slow downward taper" of your dose. For example: if you normally take four (4) pills per day, you will be asked to drop this dose to three (3) pills per day for seven (7) days, then to two (2) pills per day for seven (7) days, then to one (1) per day for seven (7) days, and at the end of those last seven (7) days, this is when the "Drug Holiday" would start.   Will I have withdrawals? By doing a "slow downward taper" like this one, it is unlikely that you will experience any significant withdrawal symptoms. Typically, what triggers withdrawals is the sudden stop of a high dose opioid therapy. Withdrawals can usually be avoided by slowly decreasing the dose over a prolonged period of time. If you do not follow these instructions and decide to stop your medication abruptly, withdrawals may be possible.  What are withdrawals? Withdrawals: refers to the wide range of symptoms that occur after stopping or dramatically reducing opiate drugs after heavy and prolonged use. Withdrawal symptoms do not occur to patients that use low dose opioids, or those who take the medication sporadically. Contrary to benzodiazepine (example: Valium, Xanax, etc.) or alcohol withdrawals  ("Delirium Tremens"), opioid withdrawals are not lethal. Withdrawals are the physical manifestation of the body getting rid of the excess receptors.  Expected Symptoms Early symptoms of withdrawal may include: Agitation Anxiety Muscle aches Increased tearing Insomnia Runny nose Sweating Yawning  Late symptoms of withdrawal may include: Abdominal cramping Diarrhea Dilated pupils Goose bumps Nausea Vomiting  Will I experience withdrawals? Due to the slow nature of the taper, it is very unlikely that you will experience any.  What is a slow taper? Taper: refers to the gradual decrease in dose.  (Last update: 05/23/2020) ____________________________________________________________________________________________

## 2022-05-16 ENCOUNTER — Other Ambulatory Visit: Payer: Self-pay

## 2022-05-20 ENCOUNTER — Other Ambulatory Visit: Payer: Self-pay

## 2022-06-27 ENCOUNTER — Other Ambulatory Visit: Payer: Self-pay

## 2022-07-15 ENCOUNTER — Other Ambulatory Visit: Payer: Self-pay

## 2022-08-01 ENCOUNTER — Other Ambulatory Visit: Payer: Self-pay

## 2022-08-27 ENCOUNTER — Other Ambulatory Visit: Payer: Self-pay

## 2022-08-28 ENCOUNTER — Other Ambulatory Visit: Payer: Self-pay

## 2022-08-28 MED ORDER — VALACYCLOVIR HCL 500 MG PO TABS
500.0000 mg | ORAL_TABLET | Freq: Two times a day (BID) | ORAL | 4 refills | Status: AC
  Filled 2022-08-28 – 2022-11-04 (×3): qty 12, 6d supply, fill #0
  Filled 2022-12-16 – 2023-02-03 (×2): qty 12, 6d supply, fill #1
  Filled 2023-03-20: qty 12, 6d supply, fill #2

## 2022-09-09 ENCOUNTER — Other Ambulatory Visit: Payer: Self-pay

## 2022-09-09 MED ORDER — AMPHETAMINE-DEXTROAMPHETAMINE 30 MG PO TABS
1.0000 | ORAL_TABLET | Freq: Two times a day (BID) | ORAL | 0 refills | Status: DC
  Filled 2022-09-09 – 2022-11-10 (×6): qty 180, 90d supply, fill #0

## 2022-09-10 ENCOUNTER — Other Ambulatory Visit: Payer: Self-pay

## 2022-09-12 ENCOUNTER — Other Ambulatory Visit: Payer: Self-pay

## 2022-09-18 ENCOUNTER — Other Ambulatory Visit: Payer: Self-pay

## 2022-09-30 ENCOUNTER — Other Ambulatory Visit: Payer: Self-pay

## 2022-10-01 ENCOUNTER — Other Ambulatory Visit: Payer: Self-pay

## 2022-10-01 MED ORDER — LAMOTRIGINE 200 MG PO TABS
200.0000 mg | ORAL_TABLET | Freq: Every day | ORAL | 3 refills | Status: AC
  Filled 2022-10-01 – 2022-11-04 (×2): qty 90, 90d supply, fill #0
  Filled 2023-02-03: qty 90, 90d supply, fill #1
  Filled 2023-05-07: qty 90, 90d supply, fill #2
  Filled 2023-09-22: qty 90, 90d supply, fill #3

## 2022-10-02 ENCOUNTER — Other Ambulatory Visit: Payer: Self-pay

## 2022-10-13 ENCOUNTER — Other Ambulatory Visit: Payer: Self-pay

## 2022-10-20 IMAGING — CR DG SI JOINTS 3+V
1 series · 3 of 3 positions shown · non-contrast
Comparison: [facility] 08/28/2011

CLINICAL DATA: Left hip pain and arthralgia. Chronic. Worsened in
the past 2 weeks. History of lower back surgery.

EXAM:
BILATERAL SACROILIAC JOINTS - 3+ VIEW

[Series 1: dg si joints · 0.14mm/px · 3 of 3 slices shown]
[im 1/3]
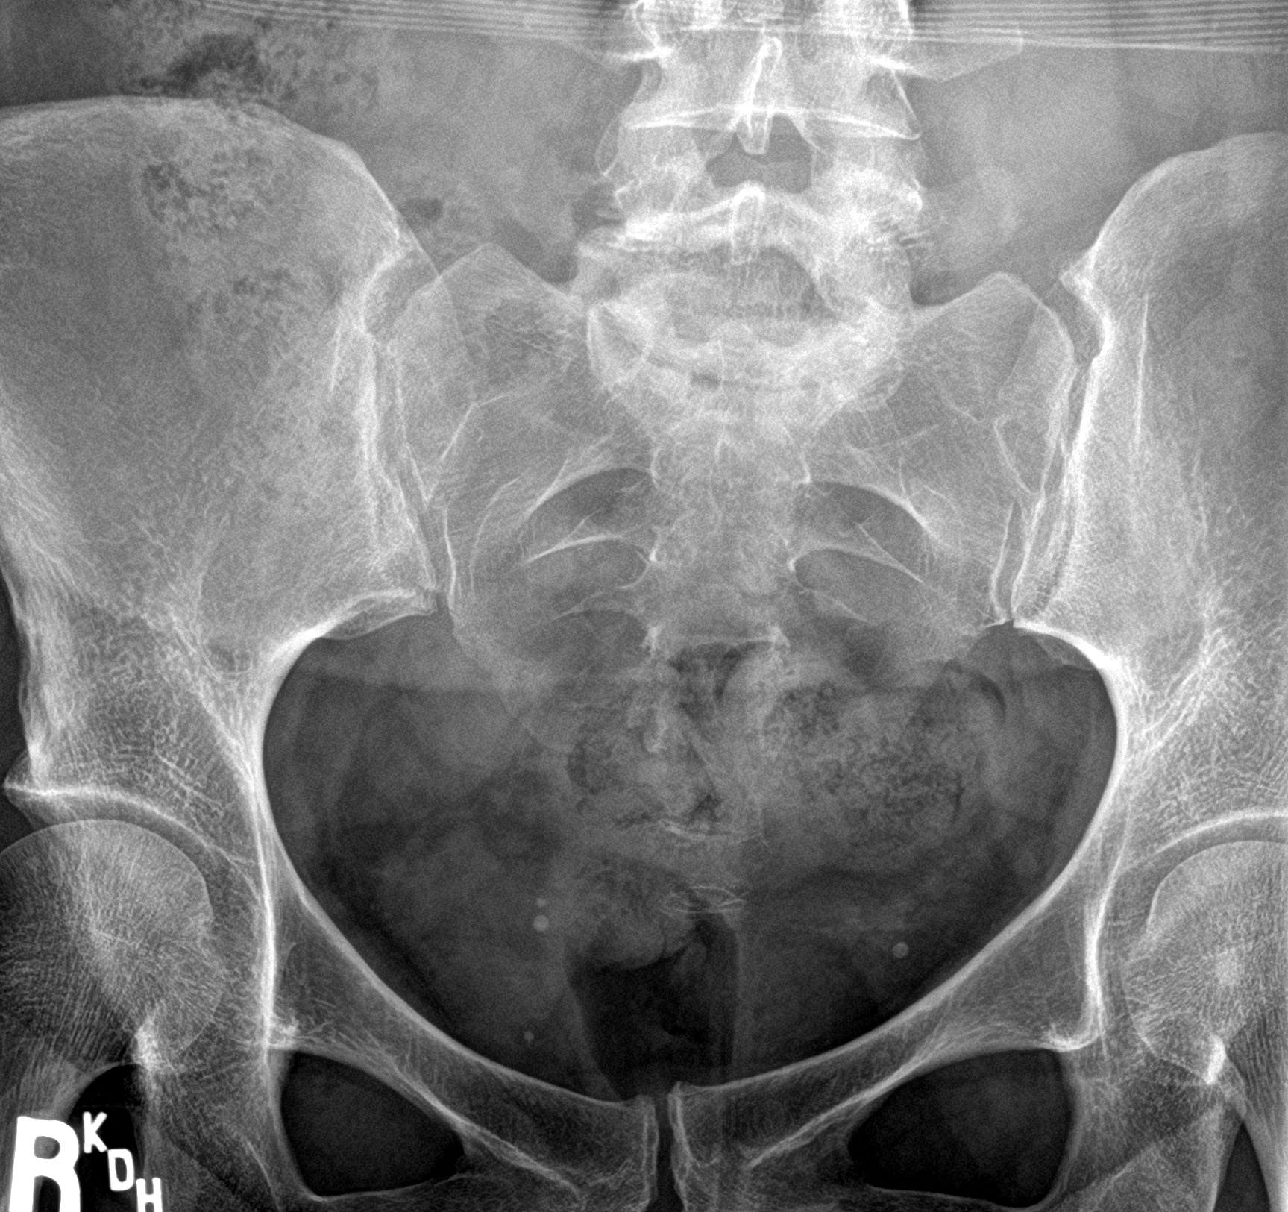
[im 2/3]
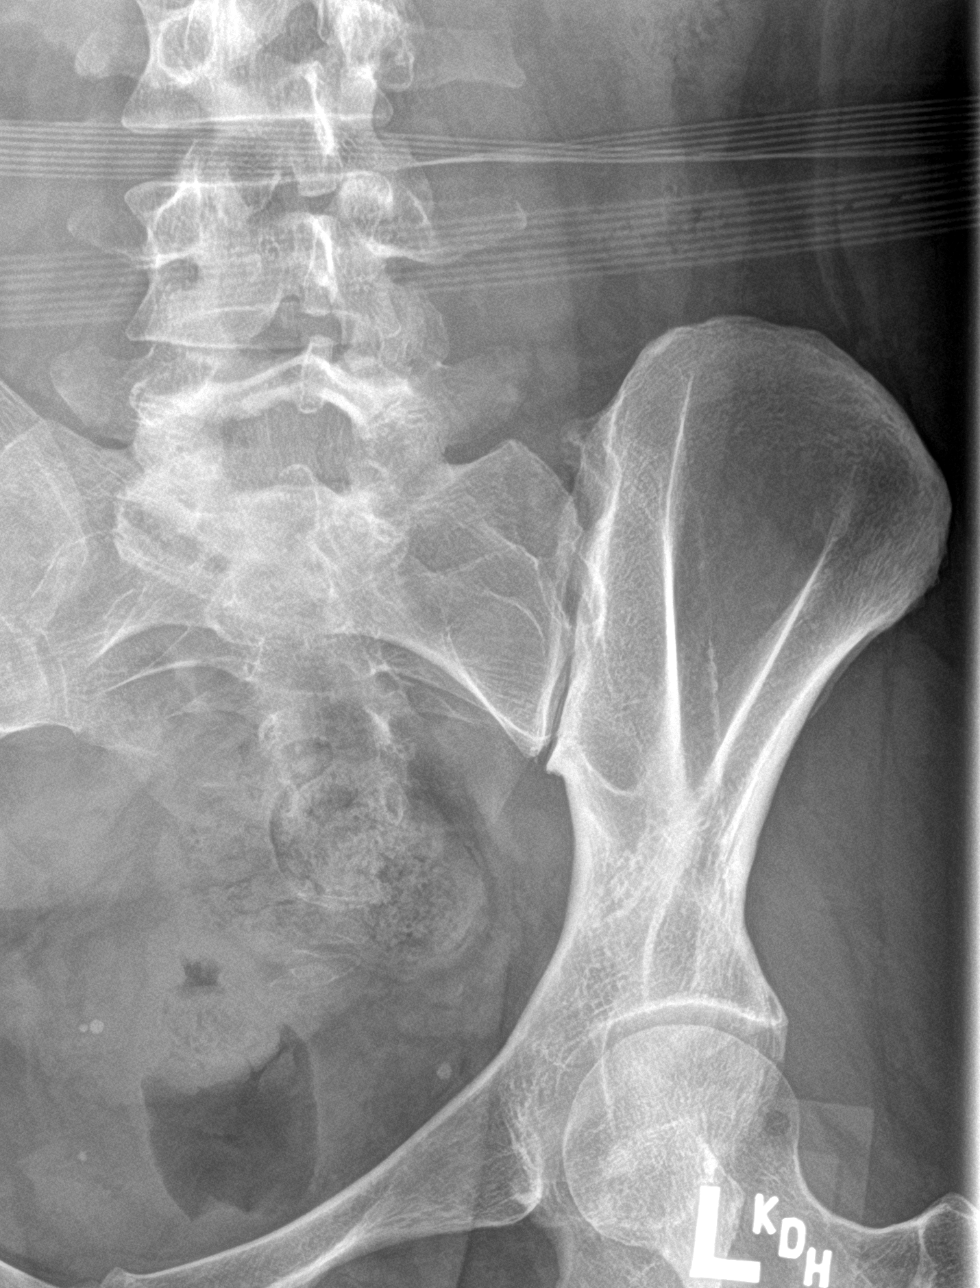
[im 3/3]
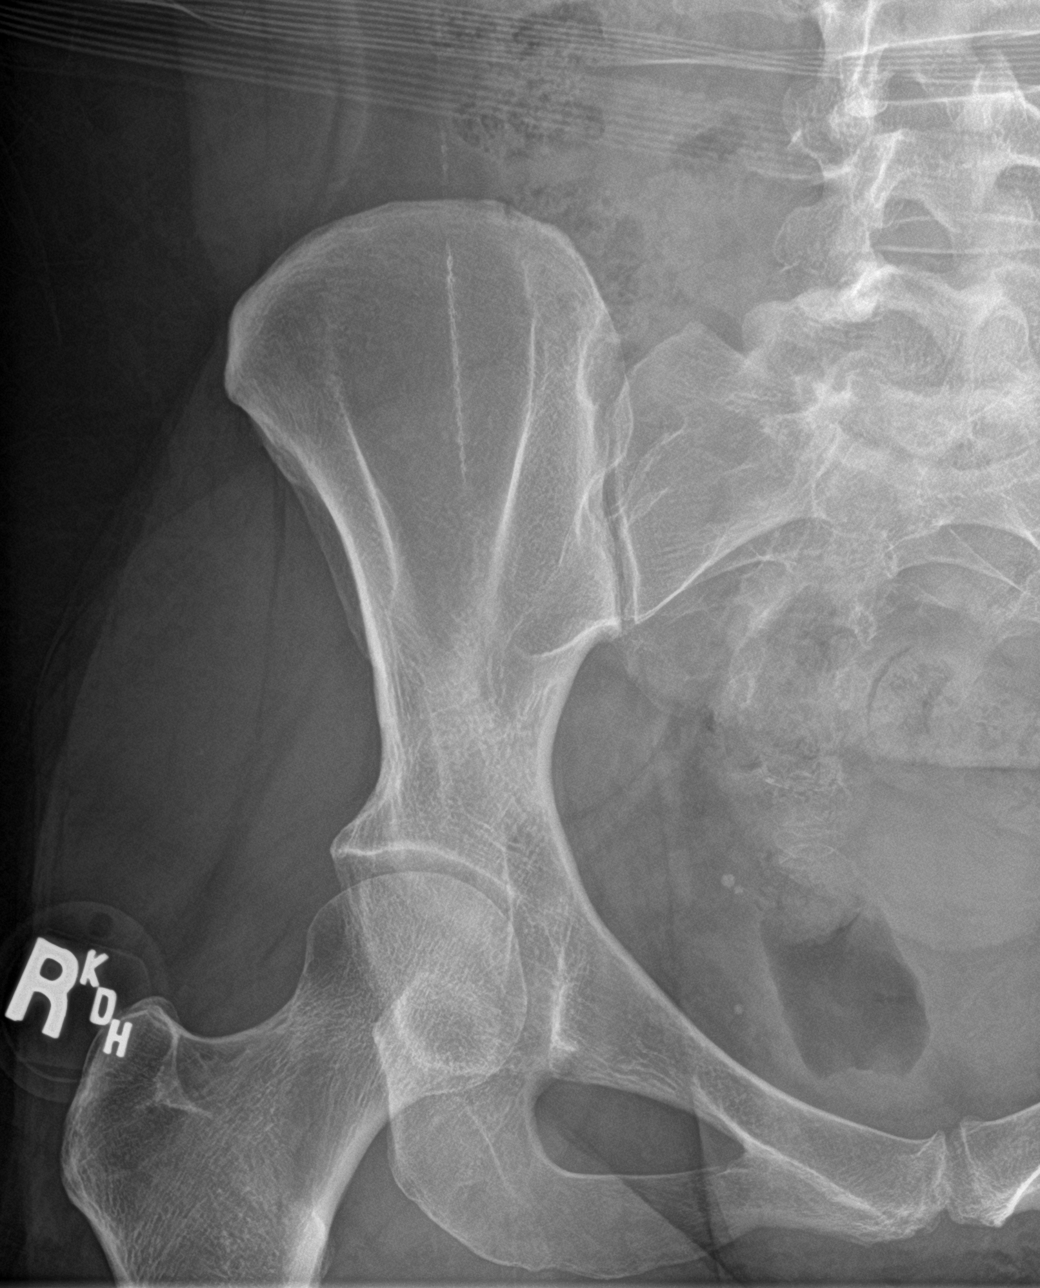

[3 of 3 positions shown; findings below may reference images not displayed]

FINDINGS: The bilateral sacroiliac joint spaces are maintained. No subchondral
erosion. The bilateral femoroacetabular joint spaces are maintained.
Mild pubic symphysis joint space narrowing and peripheral
osteophytosis. Vascular phleboliths overlie the pelvis. No acute
fracture or dislocation.
IMPRESSION: Mild pubic symphysis osteoarthritis.

Normal appearance of the bilateral sacroiliac joints.

## 2022-11-04 ENCOUNTER — Other Ambulatory Visit: Payer: Self-pay

## 2022-11-10 ENCOUNTER — Other Ambulatory Visit: Payer: Self-pay

## 2022-11-11 ENCOUNTER — Other Ambulatory Visit: Payer: Self-pay

## 2022-12-16 DIAGNOSIS — E663 Overweight: Secondary | ICD-10-CM | POA: Diagnosis not present

## 2022-12-30 ENCOUNTER — Other Ambulatory Visit: Payer: Self-pay

## 2023-01-21 ENCOUNTER — Other Ambulatory Visit: Payer: Self-pay

## 2023-01-21 MED ORDER — AMPHETAMINE-DEXTROAMPHETAMINE 30 MG PO TABS
1.0000 | ORAL_TABLET | Freq: Two times a day (BID) | ORAL | 0 refills | Status: DC
  Filled 2023-01-21 – 2023-02-08 (×2): qty 180, 90d supply, fill #0

## 2023-02-08 ENCOUNTER — Other Ambulatory Visit: Payer: Self-pay

## 2023-02-09 ENCOUNTER — Other Ambulatory Visit: Payer: Self-pay

## 2023-03-20 DIAGNOSIS — E663 Overweight: Secondary | ICD-10-CM | POA: Diagnosis not present

## 2023-04-01 ENCOUNTER — Other Ambulatory Visit: Payer: Self-pay

## 2023-04-01 MED ORDER — AMPHETAMINE-DEXTROAMPHETAMINE 30 MG PO TABS
1.0000 | ORAL_TABLET | Freq: Two times a day (BID) | ORAL | 0 refills | Status: AC
  Filled 2023-04-01 – 2023-05-07 (×2): qty 180, 90d supply, fill #0

## 2023-04-01 MED ORDER — BUSPIRONE HCL 10 MG PO TABS
10.0000 mg | ORAL_TABLET | Freq: Two times a day (BID) | ORAL | 11 refills | Status: DC
  Filled 2023-04-01: qty 60, 30d supply, fill #0

## 2023-04-02 ENCOUNTER — Other Ambulatory Visit: Payer: Self-pay

## 2023-05-07 ENCOUNTER — Other Ambulatory Visit: Payer: Self-pay

## 2023-05-08 ENCOUNTER — Other Ambulatory Visit: Payer: Self-pay

## 2023-05-08 DIAGNOSIS — E663 Overweight: Secondary | ICD-10-CM | POA: Diagnosis not present

## 2023-07-17 DIAGNOSIS — E538 Deficiency of other specified B group vitamins: Secondary | ICD-10-CM | POA: Diagnosis not present

## 2023-07-17 DIAGNOSIS — M519 Unspecified thoracic, thoracolumbar and lumbosacral intervertebral disc disorder: Secondary | ICD-10-CM | POA: Diagnosis not present

## 2023-07-17 DIAGNOSIS — K5190 Ulcerative colitis, unspecified, without complications: Secondary | ICD-10-CM | POA: Diagnosis not present

## 2023-07-17 DIAGNOSIS — Z0000 Encounter for general adult medical examination without abnormal findings: Secondary | ICD-10-CM | POA: Diagnosis not present

## 2023-07-17 DIAGNOSIS — E785 Hyperlipidemia, unspecified: Secondary | ICD-10-CM | POA: Diagnosis not present

## 2023-07-17 DIAGNOSIS — Z124 Encounter for screening for malignant neoplasm of cervix: Secondary | ICD-10-CM | POA: Diagnosis not present

## 2023-07-17 DIAGNOSIS — F411 Generalized anxiety disorder: Secondary | ICD-10-CM | POA: Diagnosis not present

## 2023-09-22 ENCOUNTER — Other Ambulatory Visit: Payer: Self-pay

## 2023-09-22 MED ORDER — VALACYCLOVIR HCL 500 MG PO TABS
500.0000 mg | ORAL_TABLET | Freq: Two times a day (BID) | ORAL | 4 refills | Status: AC
  Filled 2023-09-22 – 2023-12-06 (×4): qty 12, 6d supply, fill #0
  Filled 2024-01-14 – 2024-03-17 (×3): qty 12, 6d supply, fill #1

## 2023-10-05 ENCOUNTER — Other Ambulatory Visit: Payer: Self-pay

## 2023-10-20 ENCOUNTER — Other Ambulatory Visit: Payer: Self-pay

## 2023-10-21 ENCOUNTER — Other Ambulatory Visit: Payer: Self-pay

## 2023-10-21 MED ORDER — LAMOTRIGINE 200 MG PO TABS
200.0000 mg | ORAL_TABLET | Freq: Every day | ORAL | 3 refills | Status: AC
  Filled 2023-10-21 – 2023-12-06 (×3): qty 90, 90d supply, fill #0
  Filled 2024-03-03 – 2024-06-13 (×4): qty 90, 90d supply, fill #1

## 2023-10-21 MED ORDER — IBUPROFEN 800 MG PO TABS
800.0000 mg | ORAL_TABLET | Freq: Three times a day (TID) | ORAL | 11 refills | Status: AC | PRN
  Filled 2023-10-21 – 2023-12-06 (×3): qty 30, 10d supply, fill #0
  Filled 2024-01-14 – 2024-06-13 (×5): qty 30, 10d supply, fill #1

## 2023-11-03 ENCOUNTER — Other Ambulatory Visit: Payer: Self-pay

## 2023-11-10 ENCOUNTER — Other Ambulatory Visit: Payer: Self-pay

## 2023-11-24 ENCOUNTER — Other Ambulatory Visit: Payer: Self-pay

## 2023-12-06 ENCOUNTER — Other Ambulatory Visit: Payer: Self-pay

## 2023-12-07 ENCOUNTER — Other Ambulatory Visit: Payer: Self-pay

## 2023-12-11 DIAGNOSIS — M5416 Radiculopathy, lumbar region: Secondary | ICD-10-CM | POA: Diagnosis not present

## 2023-12-18 ENCOUNTER — Other Ambulatory Visit: Payer: Self-pay

## 2023-12-18 MED ORDER — AMPHETAMINE-DEXTROAMPHETAMINE 30 MG PO TABS
1.0000 | ORAL_TABLET | Freq: Two times a day (BID) | ORAL | 0 refills | Status: AC
  Filled 2023-12-22 – 2024-06-13 (×9): qty 180, 90d supply, fill #0

## 2023-12-22 ENCOUNTER — Other Ambulatory Visit: Payer: Self-pay

## 2023-12-30 DIAGNOSIS — M461 Sacroiliitis, not elsewhere classified: Secondary | ICD-10-CM | POA: Diagnosis not present

## 2023-12-31 ENCOUNTER — Other Ambulatory Visit: Payer: Self-pay

## 2024-01-10 ENCOUNTER — Other Ambulatory Visit: Payer: Self-pay

## 2024-01-14 ENCOUNTER — Other Ambulatory Visit: Payer: Self-pay

## 2024-01-15 ENCOUNTER — Other Ambulatory Visit: Payer: Self-pay

## 2024-01-18 ENCOUNTER — Other Ambulatory Visit: Payer: Self-pay

## 2024-01-26 ENCOUNTER — Other Ambulatory Visit: Payer: Self-pay

## 2024-03-03 ENCOUNTER — Other Ambulatory Visit: Payer: Self-pay

## 2024-03-15 ENCOUNTER — Other Ambulatory Visit: Payer: Self-pay

## 2024-03-31 ENCOUNTER — Other Ambulatory Visit: Payer: Self-pay

## 2024-04-22 ENCOUNTER — Other Ambulatory Visit: Payer: Self-pay

## 2024-05-03 ENCOUNTER — Other Ambulatory Visit: Payer: Self-pay

## 2024-06-13 ENCOUNTER — Other Ambulatory Visit: Payer: Self-pay

## 2024-06-23 ENCOUNTER — Other Ambulatory Visit: Payer: Self-pay

## 2024-09-07 ENCOUNTER — Other Ambulatory Visit: Payer: Self-pay

## 2024-09-07 DIAGNOSIS — M519 Unspecified thoracic, thoracolumbar and lumbosacral intervertebral disc disorder: Secondary | ICD-10-CM | POA: Diagnosis not present

## 2024-09-07 DIAGNOSIS — K5190 Ulcerative colitis, unspecified, without complications: Secondary | ICD-10-CM | POA: Diagnosis not present

## 2024-09-07 DIAGNOSIS — F411 Generalized anxiety disorder: Secondary | ICD-10-CM | POA: Diagnosis not present

## 2024-09-07 DIAGNOSIS — E785 Hyperlipidemia, unspecified: Secondary | ICD-10-CM | POA: Diagnosis not present

## 2024-09-07 DIAGNOSIS — E538 Deficiency of other specified B group vitamins: Secondary | ICD-10-CM | POA: Diagnosis not present

## 2024-09-07 DIAGNOSIS — Z124 Encounter for screening for malignant neoplasm of cervix: Secondary | ICD-10-CM | POA: Diagnosis not present

## 2024-09-07 DIAGNOSIS — Z131 Encounter for screening for diabetes mellitus: Secondary | ICD-10-CM | POA: Diagnosis not present

## 2024-09-07 DIAGNOSIS — Z0000 Encounter for general adult medical examination without abnormal findings: Secondary | ICD-10-CM | POA: Diagnosis not present

## 2024-09-07 DIAGNOSIS — F331 Major depressive disorder, recurrent, moderate: Secondary | ICD-10-CM | POA: Diagnosis not present

## 2024-09-07 MED ORDER — LAMOTRIGINE ER 100 MG PO TB24
100.0000 mg | ORAL_TABLET | Freq: Every day | ORAL | 11 refills | Status: AC
  Filled 2024-09-07: qty 90, 90d supply, fill #0
  Filled 2024-12-01: qty 90, 90d supply, fill #1

## 2024-09-07 MED ORDER — PREDNISONE 10 MG PO TABS
ORAL_TABLET | ORAL | 0 refills | Status: AC
  Filled 2024-09-07: qty 12, 6d supply, fill #0

## 2024-09-07 MED ORDER — VENLAFAXINE HCL ER 75 MG PO CP24
225.0000 mg | ORAL_CAPSULE | Freq: Every day | ORAL | 11 refills | Status: AC
  Filled 2024-09-07: qty 270, 90d supply, fill #0
  Filled 2024-12-01: qty 270, 90d supply, fill #1

## 2024-11-11 ENCOUNTER — Other Ambulatory Visit: Payer: Self-pay

## 2024-11-11 MED ORDER — AMPHETAMINE-DEXTROAMPHETAMINE 30 MG PO TABS
1.0000 | ORAL_TABLET | Freq: Two times a day (BID) | ORAL | 0 refills | Status: AC
  Filled 2024-11-11: qty 180, 90d supply, fill #0
